# Patient Record
Sex: Male | Born: 1945
Health system: Southern US, Community
[De-identification: ages and names within clinical notes are randomized; demographics above are authoritative.]

## PROBLEM LIST (undated history)

## (undated) DIAGNOSIS — K819 Cholecystitis, unspecified: Secondary | ICD-10-CM

## (undated) DIAGNOSIS — K851 Biliary acute pancreatitis without necrosis or infection: Secondary | ICD-10-CM

## (undated) DIAGNOSIS — E785 Hyperlipidemia, unspecified: Secondary | ICD-10-CM

## (undated) DIAGNOSIS — I10 Essential (primary) hypertension: Secondary | ICD-10-CM

## (undated) DIAGNOSIS — R7303 Prediabetes: Secondary | ICD-10-CM

## (undated) DIAGNOSIS — I7143 Infrarenal abdominal aortic aneurysm, without rupture: Secondary | ICD-10-CM

## (undated) DIAGNOSIS — I714 Abdominal aortic aneurysm, without rupture: Secondary | ICD-10-CM

---

## 2016-08-17 DIAGNOSIS — R69 Illness, unspecified: Secondary | ICD-10-CM | POA: Diagnosis not present

## 2016-08-20 DIAGNOSIS — R69 Illness, unspecified: Secondary | ICD-10-CM | POA: Diagnosis not present

## 2016-11-27 DIAGNOSIS — R197 Diarrhea, unspecified: Secondary | ICD-10-CM | POA: Diagnosis not present

## 2016-11-27 DIAGNOSIS — R143 Flatulence: Secondary | ICD-10-CM | POA: Diagnosis not present

## 2016-11-27 DIAGNOSIS — R030 Elevated blood-pressure reading, without diagnosis of hypertension: Secondary | ICD-10-CM | POA: Diagnosis not present

## 2017-05-02 ENCOUNTER — Emergency Department (HOSPITAL_BASED_OUTPATIENT_CLINIC_OR_DEPARTMENT_OTHER): Payer: Medicare HMO

## 2017-05-02 ENCOUNTER — Other Ambulatory Visit: Payer: Self-pay

## 2017-05-02 ENCOUNTER — Encounter (HOSPITAL_BASED_OUTPATIENT_CLINIC_OR_DEPARTMENT_OTHER): Payer: Self-pay | Admitting: *Deleted

## 2017-05-02 ENCOUNTER — Inpatient Hospital Stay (HOSPITAL_BASED_OUTPATIENT_CLINIC_OR_DEPARTMENT_OTHER)
Admission: EM | Admit: 2017-05-02 | Discharge: 2017-05-08 | DRG: 418 | Disposition: A | Discharge status: Home / Self Care | Payer: Medicare HMO | Attending: Internal Medicine | Admitting: Internal Medicine

## 2017-05-02 DIAGNOSIS — K8500 Idiopathic acute pancreatitis without necrosis or infection: Secondary | ICD-10-CM

## 2017-05-02 DIAGNOSIS — R197 Diarrhea, unspecified: Secondary | ICD-10-CM | POA: Diagnosis not present

## 2017-05-02 DIAGNOSIS — R7303 Prediabetes: Secondary | ICD-10-CM | POA: Diagnosis not present

## 2017-05-02 DIAGNOSIS — D72828 Other elevated white blood cell count: Secondary | ICD-10-CM | POA: Diagnosis present

## 2017-05-02 DIAGNOSIS — K811 Chronic cholecystitis: Secondary | ICD-10-CM | POA: Diagnosis not present

## 2017-05-02 DIAGNOSIS — I739 Peripheral vascular disease, unspecified: Secondary | ICD-10-CM | POA: Diagnosis present

## 2017-05-02 DIAGNOSIS — R52 Pain, unspecified: Secondary | ICD-10-CM

## 2017-05-02 DIAGNOSIS — Z87891 Personal history of nicotine dependence: Secondary | ICD-10-CM | POA: Diagnosis not present

## 2017-05-02 DIAGNOSIS — K8020 Calculus of gallbladder without cholecystitis without obstruction: Secondary | ICD-10-CM | POA: Diagnosis not present

## 2017-05-02 DIAGNOSIS — D72829 Elevated white blood cell count, unspecified: Secondary | ICD-10-CM | POA: Diagnosis present

## 2017-05-02 DIAGNOSIS — I714 Abdominal aortic aneurysm, without rupture, unspecified: Secondary | ICD-10-CM | POA: Diagnosis present

## 2017-05-02 DIAGNOSIS — K810 Acute cholecystitis: Secondary | ICD-10-CM | POA: Diagnosis present

## 2017-05-02 DIAGNOSIS — E785 Hyperlipidemia, unspecified: Secondary | ICD-10-CM | POA: Diagnosis present

## 2017-05-02 DIAGNOSIS — K760 Fatty (change of) liver, not elsewhere classified: Secondary | ICD-10-CM | POA: Diagnosis not present

## 2017-05-02 DIAGNOSIS — K8012 Calculus of gallbladder with acute and chronic cholecystitis without obstruction: Secondary | ICD-10-CM | POA: Diagnosis present

## 2017-05-02 DIAGNOSIS — R935 Abnormal findings on diagnostic imaging of other abdominal regions, including retroperitoneum: Secondary | ICD-10-CM | POA: Diagnosis not present

## 2017-05-02 DIAGNOSIS — R945 Abnormal results of liver function studies: Secondary | ICD-10-CM | POA: Diagnosis not present

## 2017-05-02 DIAGNOSIS — I10 Essential (primary) hypertension: Secondary | ICD-10-CM | POA: Diagnosis present

## 2017-05-02 DIAGNOSIS — R739 Hyperglycemia, unspecified: Secondary | ICD-10-CM | POA: Diagnosis present

## 2017-05-02 DIAGNOSIS — Z803 Family history of malignant neoplasm of breast: Secondary | ICD-10-CM

## 2017-05-02 DIAGNOSIS — Z8249 Family history of ischemic heart disease and other diseases of the circulatory system: Secondary | ICD-10-CM | POA: Diagnosis not present

## 2017-05-02 DIAGNOSIS — Z882 Allergy status to sulfonamides status: Secondary | ICD-10-CM | POA: Diagnosis not present

## 2017-05-02 DIAGNOSIS — K851 Biliary acute pancreatitis without necrosis or infection: Principal | ICD-10-CM | POA: Diagnosis present

## 2017-05-02 DIAGNOSIS — K859 Acute pancreatitis without necrosis or infection, unspecified: Secondary | ICD-10-CM

## 2017-05-02 DIAGNOSIS — R109 Unspecified abdominal pain: Secondary | ICD-10-CM | POA: Diagnosis not present

## 2017-05-02 DIAGNOSIS — I160 Hypertensive urgency: Secondary | ICD-10-CM | POA: Diagnosis present

## 2017-05-02 DIAGNOSIS — Z7982 Long term (current) use of aspirin: Secondary | ICD-10-CM

## 2017-05-02 DIAGNOSIS — E162 Hypoglycemia, unspecified: Secondary | ICD-10-CM | POA: Diagnosis not present

## 2017-05-02 DIAGNOSIS — E876 Hypokalemia: Secondary | ICD-10-CM | POA: Diagnosis not present

## 2017-05-02 HISTORY — DX: Essential (primary) hypertension: I10

## 2017-05-02 HISTORY — DX: Prediabetes: R73.03

## 2017-05-02 HISTORY — DX: Hyperlipidemia, unspecified: E78.5

## 2017-05-02 LAB — LIPID PANEL
CHOL/HDL RATIO: 2.5 ratio
CHOLESTEROL: 176 mg/dL (ref 0–200)
HDL: 71 mg/dL (ref >40)
LDL Cholesterol: 91 mg/dL (ref 0–99)
Triglycerides: 69 mg/dL (ref <150)
VLDL: 14 mg/dL (ref 0–40)

## 2017-05-02 LAB — LIPASE, BLOOD: Lipase: 3890 U/L — ABNORMAL HIGH (ref 11–51)

## 2017-05-02 LAB — URINALYSIS, ROUTINE W REFLEX MICROSCOPIC
BILIRUBIN URINE: NEGATIVE
GLUCOSE, UA: 250 mg/dL — AB
HGB URINE DIPSTICK: NEGATIVE
Ketones, ur: 15 mg/dL — AB
Leukocytes, UA: NEGATIVE
Nitrite: NEGATIVE
PH: 6.5 (ref 5.0–8.0)
Protein, ur: NEGATIVE mg/dL
SPECIFIC GRAVITY, URINE: 1.01 (ref 1.005–1.030)

## 2017-05-02 LAB — COMPREHENSIVE METABOLIC PANEL
ALBUMIN: 4.3 g/dL (ref 3.5–5.0)
ALK PHOS: 72 U/L (ref 38–126)
ALT: 272 U/L — ABNORMAL HIGH (ref 17–63)
AST: 469 U/L — AB (ref 15–41)
Anion gap: 13 (ref 5–15)
BUN: 15 mg/dL (ref 6–20)
CO2: 23 mmol/L (ref 22–32)
Calcium: 9.2 mg/dL (ref 8.9–10.3)
Chloride: 100 mmol/L — ABNORMAL LOW (ref 101–111)
Creatinine, Ser: 0.92 mg/dL (ref 0.61–1.24)
GFR calc Af Amer: >60 mL/min (ref >60)
GFR calc non Af Amer: >60 mL/min (ref >60)
GLUCOSE: 252 mg/dL — AB (ref 65–99)
POTASSIUM: 4.2 mmol/L (ref 3.5–5.1)
SODIUM: 136 mmol/L (ref 135–145)
Total Bilirubin: 2 mg/dL — ABNORMAL HIGH (ref 0.3–1.2)
Total Protein: 7.8 g/dL (ref 6.5–8.1)

## 2017-05-02 LAB — CBC
HEMATOCRIT: 45.9 % (ref 39.0–52.0)
Hemoglobin: 15.7 g/dL (ref 13.0–17.0)
MCH: 29.1 pg (ref 26.0–34.0)
MCHC: 34.2 g/dL (ref 30.0–36.0)
MCV: 85 fL (ref 78.0–100.0)
Platelets: 284 10*3/uL (ref 150–400)
RBC: 5.4 MIL/uL (ref 4.22–5.81)
RDW: 13.3 % (ref 11.5–15.5)
WBC: 11.7 10*3/uL — ABNORMAL HIGH (ref 4.0–10.5)

## 2017-05-02 LAB — GLUCOSE, CAPILLARY: Glucose-Capillary: 176 mg/dL — ABNORMAL HIGH (ref 65–99)

## 2017-05-02 MED ORDER — MORPHINE SULFATE (PF) 4 MG/ML IV SOLN
2.0000 mg | INTRAVENOUS | Status: DC | PRN
  Administered 2017-05-02 – 2017-05-04 (×10): 2 mg via INTRAVENOUS
  Filled 2017-05-02 (×11): qty 1

## 2017-05-02 MED ORDER — FAMOTIDINE IN NACL 20-0.9 MG/50ML-% IV SOLN
20.0000 mg | Freq: Once | INTRAVENOUS | Status: AC
  Administered 2017-05-02: 20 mg via INTRAVENOUS
  Filled 2017-05-02: qty 50

## 2017-05-02 MED ORDER — IOPAMIDOL (ISOVUE-300) INJECTION 61%
100.0000 mL | Freq: Once | INTRAVENOUS | Status: AC | PRN
  Administered 2017-05-02: 100 mL via INTRAVENOUS

## 2017-05-02 MED ORDER — INSULIN ASPART 100 UNIT/ML ~~LOC~~ SOLN
0.0000 [IU] | Freq: Four times a day (QID) | SUBCUTANEOUS | Status: DC
  Administered 2017-05-02 – 2017-05-03 (×3): 2 [IU] via SUBCUTANEOUS
  Administered 2017-05-04: 3 [IU] via SUBCUTANEOUS

## 2017-05-02 MED ORDER — IPRATROPIUM-ALBUTEROL 0.5-2.5 (3) MG/3ML IN SOLN: 3.0000 mL | RESPIRATORY_TRACT | Status: DC | PRN

## 2017-05-02 MED ORDER — ONDANSETRON HCL 4 MG/2ML IJ SOLN
4.0000 mg | Freq: Four times a day (QID) | INTRAMUSCULAR | Status: DC | PRN
  Administered 2017-05-02 – 2017-05-04 (×4): 4 mg via INTRAVENOUS
  Filled 2017-05-02 (×4): qty 2

## 2017-05-02 MED ORDER — SODIUM CHLORIDE 0.9 % IV SOLN
INTRAVENOUS | Status: DC
  Administered 2017-05-02 – 2017-05-05 (×10): via INTRAVENOUS
  Administered 2017-05-06: 1000 mL via INTRAVENOUS
  Administered 2017-05-06: 04:00:00 via INTRAVENOUS

## 2017-05-02 MED ORDER — HYDRALAZINE HCL 20 MG/ML IJ SOLN
10.0000 mg | INTRAMUSCULAR | Status: DC | PRN
  Administered 2017-05-04 – 2017-05-06 (×3): 10 mg via INTRAVENOUS
  Filled 2017-05-02 (×3): qty 1

## 2017-05-02 MED ORDER — ENOXAPARIN SODIUM 40 MG/0.4ML ~~LOC~~ SOLN
40.0000 mg | SUBCUTANEOUS | Status: DC
  Administered 2017-05-02 – 2017-05-04 (×3): 40 mg via SUBCUTANEOUS
  Filled 2017-05-02 (×3): qty 0.4

## 2017-05-02 MED ORDER — HYDRALAZINE HCL 20 MG/ML IJ SOLN
10.0000 mg | Freq: Once | INTRAMUSCULAR | Status: AC
  Administered 2017-05-02: 10 mg via INTRAVENOUS
  Filled 2017-05-02: qty 1

## 2017-05-02 MED ORDER — HYDROMORPHONE HCL 1 MG/ML IJ SOLN
1.0000 mg | Freq: Once | INTRAMUSCULAR | Status: AC
  Administered 2017-05-02: 1 mg via INTRAVENOUS
  Filled 2017-05-02: qty 1

## 2017-05-02 NOTE — ED Notes (Signed)
ED Provider at bedside. 

## 2017-05-02 NOTE — H&P (Addendum)
History and Physical    Nathan Baxter ZOX:[id number] DOB: 10-07-1945 DOA: 05/02/2017  Referring MD/NP/PA: Dr. Thurman Coyer PCP: Patient, No Pcp Per  Patient coming from:  [facility] transfer  Chief Complaint:   Abdominal pain Patient with previous history of I have personally briefly reviewed patient's old medical records in [facility]   HPI: Nathan Baxter is a 72 y.o. male with medical history significant of HTN, hyperlipidemia, and prediabetes; who presents with complaints of abdominal pain.  Patient reports having acute onset of generalized crampy abdominal pain early this morning around 1 AM.  Rate pain as a 10 out of 10 on the pain scale.  Thereafter he reported having 4-5 episodes of loose diarrhea and 3-4 episodes of nonbloody vomiting.  Associated symptoms included feeling clammy, belching, insomnia, and short of breath due to pain.  Nothing helped relieve symptoms.  He has never had symptoms like this past.  Denies any chest pain, recent sick contacts, significant alcohol use, NSAID use, or dysuria.   Patient previously lived in [place] and reports having issues with hypertension for which he was on lisinopril and hydrochlorothiazide, and prediabetes treated with metformin.  He reports being off blood pressure medications for couple years now since moving to [place] as he does not have a PCP.  Reports that he self stopped metformin as it gave him significant diarrhea.  Currently he takes a aspirin and over-the-counter medications for cholesterol.  ED Course: Upon admission into the emergency department patient was noted to be afebrile, pulse in the 70s, blood pressure 138/77-, O2 saturations 200/110, and O2 saturations 89-100% on room air.  Labs reviewed significant for RBC 11.7, glucose 252,  lipase 3890, AST 469, ALT 272, and total bilirubin 2.  Urinalysis is negative for any acute abnormalities.  CT scan of the abdomen showed focal wall thickening and inflammatory changes along the  posterior wall of the gastric body reported to be concerning for peptic ulcer and 3.3 cm infrarenal abdominal aortic aneurysm.  Patient was started on normal saline at 125 mL/h, Pepcid IV, antiemetics, and pain medication.  Accepted to a MedSurg bed at [facility].  Review of Systems  Constitutional: Positive for diaphoresis and malaise/fatigue. Negative for fever.  HENT: Negative for congestion and ear discharge.   Eyes: Negative for pain and discharge.  Respiratory: Positive for cough and shortness of breath.   Cardiovascular: Negative for chest pain and leg swelling.  Gastrointestinal: Positive for abdominal pain, diarrhea, nausea and vomiting. Negative for blood in stool.  Genitourinary: Negative for dysuria and hematuria.  Musculoskeletal: Negative for falls and neck pain.  Skin: Negative for itching and rash.  Neurological: Negative for sensory change and focal weakness.  Psychiatric/Behavioral: Negative for substance abuse.    Past Medical History:  Diagnosis Date  . HLD (hyperlipidemia)   . HTN (hypertension)   . Prediabetes     History reviewed. No pertinent surgical history.   reports that he has quit smoking. he has never used smokeless tobacco. He reports that he drinks alcohol. He reports that he does not use drugs.  Allergies  Allergen Reactions  . Sulfa Antibiotics     Family History  Problem Relation Age of Onset  . Breast cancer Mother   . Hypertension Father   . Hypertension Sister     Prior to Admission medications   Medication Sig Start Date End Date Taking? Authorizing Provider  aspirin EC 81 MG tablet Take 81 mg by mouth daily.   Yes [provider]  cyanocobalamin 500 MCG tablet Take 500 mcg by mouth daily.   Yes [provider]  folic acid (FOLVITE) 1 MG tablet Take 1 mg by mouth daily.   Yes [provider]  Multiple Vitamin (MULTIVITAMIN) tablet Take 1 tablet by mouth daily.   Yes [provider]  Omega-3 Fatty  Acids (FISH OIL) 1000 MG CPDR Take by mouth.   Yes [provider]    Physical Exam:  Constitutional: NAD, calm, comfortable Vitals:   05/02/17 1630 05/02/17 1821 05/02/17 1900 05/02/17 1947  BP: (!) 178/105 (!) 200/110 (!) 150/90 138/77  Pulse: 66   75  Resp: 16   18  Temp:    98.9 F (37.2 C)  TempSrc:    Oral  SpO2: 95%   98%  Weight:      Height:       Eyes: PERRL, lids and conjunctivae normal ENMT: Mucous membranes are moist. Posterior pharynx clear of any exudate or lesions.Normal dentition.  Neck: normal, supple, no masses, no thyromegaly Respiratory: clear to auscultation bilaterally, no wheezing, no crackles. Normal respiratory effort. No accessory muscle use.  Cardiovascular: Regular rate and rhythm, no murmurs / rubs / gallops. No extremity edema. 2+ pedal pulses. No carotid bruits.  Abdomen: Epigastric tenderness present.  No masses palpated. No hepatosplenomegaly. Bowel sounds positive.  Musculoskeletal: no clubbing / cyanosis. No joint deformity upper and lower extremities. Good ROM, no contractures. Normal muscle tone.  Skin: no rashes, lesions, ulcers. No induration Neurologic: CN 2-12 grossly intact. Sensation intact, DTR normal. Strength 5/5 in all 4.  Psychiatric: Normal judgment and insight. Alert and oriented x 3. Normal mood.     Labs on Admission: I have personally reviewed following labs and imaging studies  CBC: Recent Labs  Lab 05/02/17 1114  WBC 11.7*  HGB 15.7  HCT 45.9  MCV 85.0  PLT 284   Basic Metabolic Panel: Recent Labs  Lab 05/02/17 1114  NA 136  K 4.2  CL 100*  CO2 23  GLUCOSE 252*  BUN 15  CREATININE 0.92  CALCIUM 9.2   GFR: Estimated Creatinine Clearance: 93.2 mL/min (by C-G formula based on SCr of 0.92 mg/dL). Liver Function Tests: Recent Labs  Lab 05/02/17 1114  AST 469*  ALT 272*  ALKPHOS 72  BILITOT 2.0*  PROT 7.8  ALBUMIN 4.3   Recent Labs  Lab 05/02/17 1114  LIPASE 3,890*   No results for  input(s): AMMONIA in the last 168 hours. Coagulation Profile: No results for input(s): INR, PROTIME in the last 168 hours. Cardiac Enzymes: No results for input(s): CKTOTAL, CKMB, CKMBINDEX, TROPONINI in the last 168 hours. BNP (last 3 results) No results for input(s): PROBNP in the last 8760 hours. HbA1C: No results for input(s): HGBA1C in the last 72 hours. CBG: No results for input(s): GLUCAP in the last 168 hours. Lipid Profile: No results for input(s): CHOL, HDL, LDLCALC, TRIG, CHOLHDL, LDLDIRECT in the last 72 hours. Thyroid Function Tests: No results for input(s): TSH, T4TOTAL, FREET4, T3FREE, THYROIDAB in the last 72 hours. Anemia Panel: No results for input(s): VITAMINB12, FOLATE, FERRITIN, TIBC, IRON, RETICCTPCT in the last 72 hours. Urine analysis:    Component Value Date/Time   COLORURINE YELLOW 05/02/2017 1206   APPEARANCEUR CLEAR 05/02/2017 1206   LABSPEC 1.010 05/02/2017 1206   PHURINE 6.5 05/02/2017 1206   GLUCOSEU 250 (A) 05/02/2017 1206   HGBUR NEGATIVE 05/02/2017 1206   BILIRUBINUR NEGATIVE 05/02/2017 1206   KETONESUR 15 (A) 05/02/2017 1206  PROTEINUR NEGATIVE 05/02/2017 1206   NITRITE NEGATIVE 05/02/2017 1206   LEUKOCYTESUR NEGATIVE 05/02/2017 1206   Sepsis Labs: No results found for this or any previous visit (from the past 240 hour(s)).   Radiological Exams on Admission: Ct Abdomen Pelvis W Contrast  Result Date: 05/02/2017 CLINICAL DATA:  Acute generalized abdominal pain. EXAM: CT ABDOMEN AND PELVIS WITH CONTRAST TECHNIQUE: Multidetector CT imaging of the abdomen and pelvis was performed using the standard protocol following bolus administration of intravenous contrast. CONTRAST:  ISOVUE-300 IOPAMIDOL (ISOVUE-300) INJECTION 61% COMPARISON:  None. FINDINGS: Lower chest: No acute abnormality. Hepatobiliary: No focal liver abnormality is seen. No gallstones, gallbladder wall thickening, or biliary dilatation. Pancreas: Unremarkable. No pancreatic  ductal dilatation or surrounding inflammatory changes. Spleen: Normal in size without focal abnormality. Adrenals/Urinary Tract: Adrenal glands are unremarkable. Kidneys are normal, without renal calculi, focal lesion, or hydronephrosis. Bladder is unremarkable. Stomach/Bowel: The appendix appears normal. There is no evidence of bowel obstruction. Focal wall thickening and inflammatory changes are seen involving the posterior wall of the gastric body, which may represent peptic ulcer disease. Small amount of fluid is also seen posterior to second portion of duodenum. Vascular/Lymphatic: 3.3 cm infrarenal abdominal aortic aneurysm is noted. Atherosclerosis of abdominal aorta is noted. No adenopathy is noted. Reproductive: Prostate is unremarkable. Other: Small fat containing periumbilical hernia is noted. Musculoskeletal: No acute or significant osseous findings. IMPRESSION: Focal wall thickening and other inflammatory changes are seen along the posterior wall of gastric body concerning for possible peptic ulcer disease. Small amount of fluid is seen posterior to the second portion of duodenum which may be related to peptic ulcer disease is well. 3.3 cm infrarenal abdominal aortic aneurysm. Recommend followup by ultrasound in 3 years. This recommendation follows ACR consensus guidelines: White Paper of the ACR Incidental Findings Committee II on Vascular Findings. J Am Coll Radiol 2013; 10:789-794. Aortic Atherosclerosis (ICD10-I70.0). Electronically Signed   By: Lupita Raider, M.D.   On: 05/02/2017 13:20    EKG: Independently reviewed.  Normal sinus rhythm with left axis deviation with no previous to compare.  Assessment/Plan Suspect Pancreatitis: Acute.  Patient presents with acute onset of abdominal pain.  Lipase 3890 with elevated LFTs.  CT showing inflammation around the posterior aspect of the stomach without significant abnormality noted around the pancreas.  Suspect pancreatiti s versus PUD. - Admit  to MedSurg bed - NPO - Check lipid panel - Check abdominal ultrasound - Continue normal saline at 125 mL/h - Morphine IV prn pain - Antiemetics as needed - Incentive Spirometry - May  warrant further evaluation of over-the-counter medications - Consider need of consult GI in a.m. - Restart all home medications when medically appropriate  Leukocytosis: Acute.  Likely reactive to above. - Recheck CBC in a.m.  Hypertensive urgency: Patient noted to have elevated blood pressures 200/110 on admission.  Given IV hydralazine with improvement blood pressures.  Suspect related to pain, but patient previously on blood pressure medications but not been taking them - Hydralazine IV as needed elevated blood pressures - Patient may likely need to be placed back on blood pressure medication  Hyperglycemia: Acute.  Glucose 252 on admission.  Patient has a previous history of being prediabetic, but reports being unable to tolerate metformin was previously placed on in the past.  May need to be restarted on blood pressure medication - Hypoglycemic protocol - Check hemoglobin A1c in a.m. - CBGs every 6 hours with custom SSI  Elevated liver enzymes - recheck CMP in  a.m.  Infrarenal abdominal aortic aneurysm:  Incidental finding on CT scan noted to be 3.3 cm.   -  Recommend outpatient follow-up and ultrasound per guidelines in 3  *Patient in need of primary care provider  DVT prophylaxis: lovenox   Code Status: full  Family Communication: No family present at bedside Disposition Plan: Likely discharge home in 1-2 days Consults called: none  Admission status: inpatient  Clydie Braunondell A Kennis Wissmann MD [facility] Pager [phone]   If 7PM-7AM, please contact night-coverage [email or web address] Password [email or web address]  05/02/2017, 8:39 PM

## 2017-05-02 NOTE — Progress Notes (Signed)
[facility]. He is hemodynamically stable. Will need GI consult.   Kathlen ModyVijaya Virlee Stroschein, MD [phone]

## 2017-05-02 NOTE — ED Notes (Signed)
ED TO INPATIENT HANDOFF REPORT  Name/Age/Gender Nathan Baxter 72 y.o. male  Code Status Code Status History    This patient does not have a recorded code status. Please follow your organizational policy for patients in this situation.      Home/SNF/Other home  Chief Complaint Abdominal Pain  Level of Care/Admitting Diagnosis ED Disposition    ED Disposition Condition [facility] Area: [facility] [[id number]]  Level of Care: Med-Surg [16]  Diagnosis: Pancreatitis [[id number]]  Admitting Physician: Hosie Poisson [[id number]]  Attending Physician: Hosie Poisson [[id number]]  Estimated length of stay: 3 - 4 days  Certification:: I certify this patient will need inpatient services for at least 2 midnights  PT Class (Do Not Modify): Inpatient [101]  PT Acc Code (Do Not Modify): Private [1]       Medical History History reviewed. No pertinent past medical history.  Allergies Allergies  Allergen Reactions  . Sulfa Antibiotics     IV Location/Drains/Wounds Patient Lines/Drains/Airways Status   Active Line/Drains/Airways    Name:   Placement date:   Placement time:   Site:   Days:   Peripheral IV 05/02/17 Right Antecubital   05/02/17    1120    Antecubital   less than 1          Labs/Imaging Results for orders placed or performed during the hospital encounter of 05/02/17 (from the past 48 hour(s))  Lipase, blood     Status: Abnormal   Collection Time: 05/02/17 11:14 AM  Result Value Ref Range   Lipase 3,890 (H) 11 - 51 U/L    Comment: REPEATED TO VERIFY RESULTS CONFIRMED BY MANUAL DILUTION Performed at [facility], [address] [place], [place] [address]   Comprehensive metabolic panel     Status: Abnormal   Collection Time: 05/02/17 11:14 AM  Result Value Ref Range   Sodium 136 135 - 145 mmol/L   Potassium 4.2 3.5 - 5.1 mmol/L   Chloride 100 (L) 101 - 111 mmol/L   CO2 23 22 - 32 mmol/L   Glucose, Bld 252 (H) 65 - 99 mg/dL   BUN 15 6 - 20 mg/dL   Creatinine, Ser 0.92 0.61 - 1.24 mg/dL   Calcium 9.2 8.9 - 10.3 mg/dL   Total Protein 7.8 6.5 - 8.1 g/dL   Albumin 4.3 3.5 - 5.0 g/dL   AST 469 (H) 15 - 41 U/L   ALT 272 (H) 17 - 63 U/L   Alkaline Phosphatase 72 38 - 126 U/L   Total Bilirubin 2.0 (H) 0.3 - 1.2 mg/dL   GFR calc non Af Amer >60 >60 mL/min   GFR calc Af Amer >60 >60 mL/min    Comment: (NOTE) The eGFR has been calculated using the CKD EPI equation. This calculation has not been validated in all clinical situations. eGFR's persistently <60 mL/min signify possible Chronic Kidney Disease.    Anion gap 13 5 - 15    Comment: Performed at [facility], [address] [place], [place] [address]  CBC     Status: Abnormal   Collection Time: 05/02/17 11:14 AM  Result Value Ref Range   WBC 11.7 (H) 4.0 - 10.5 K/uL   RBC 5.40 4.22 - 5.81 MIL/uL   Hemoglobin 15.7 13.0 - 17.0 g/dL   HCT 45.9 39.0 - 52.0 %   MCV 85.0 78.0 - 100.0 fL   MCH 29.1 26.0 - 34.0 pg   MCHC 34.2  30.0 - 36.0 g/dL   RDW 13.3 11.5 - 15.5 %   Platelets 284 150 - 400 K/uL    Comment: Performed at [facility], [address] [place], [place] [address]  Urinalysis, Routine w reflex microscopic     Status: Abnormal   Collection Time: 05/02/17 12:06 PM  Result Value Ref Range   Color, Urine YELLOW YELLOW   APPearance CLEAR CLEAR   Specific Gravity, Urine 1.010 1.005 - 1.030   pH 6.5 5.0 - 8.0   Glucose, UA 250 (A) NEGATIVE mg/dL   Hgb urine dipstick NEGATIVE NEGATIVE   Bilirubin Urine NEGATIVE NEGATIVE   Ketones, ur 15 (A) NEGATIVE mg/dL   Protein, ur NEGATIVE NEGATIVE mg/dL   Nitrite NEGATIVE NEGATIVE   Leukocytes, UA NEGATIVE NEGATIVE    Comment: Microscopic not done on urines with negative protein, blood, leukocytes, nitrite, or glucose < 500 mg/dL. Performed at [facility], [address] [place], [place] [address]    Ct Abdomen Pelvis W Contrast  Result Date: 05/02/2017 CLINICAL DATA:   Acute generalized abdominal pain. EXAM: CT ABDOMEN AND PELVIS WITH CONTRAST TECHNIQUE: Multidetector CT imaging of the abdomen and pelvis was performed using the standard protocol following bolus administration of intravenous contrast. CONTRAST:  133m ISOVUE-300 IOPAMIDOL (ISOVUE-300) INJECTION 61% COMPARISON:  None. FINDINGS: Lower chest: No acute abnormality. Hepatobiliary: No focal liver abnormality is seen. No gallstones, gallbladder wall thickening, or biliary dilatation. Pancreas: Unremarkable. No pancreatic ductal dilatation or surrounding inflammatory changes. Spleen: Normal in size without focal abnormality. Adrenals/Urinary Tract: Adrenal glands are unremarkable. Kidneys are normal, without renal calculi, focal lesion, or hydronephrosis. Bladder is unremarkable. Stomach/Bowel: The appendix appears normal. There is no evidence of bowel obstruction. Focal wall thickening and inflammatory changes are seen involving the posterior wall of the gastric body, which may represent peptic ulcer disease. Small amount of fluid is also seen posterior to second portion of duodenum. Vascular/Lymphatic: 3.3 cm infrarenal abdominal aortic aneurysm is noted. Atherosclerosis of abdominal aorta is noted. No adenopathy is noted. Reproductive: Prostate is unremarkable. Other: Small fat containing periumbilical hernia is noted. Musculoskeletal: No acute or significant osseous findings. IMPRESSION: Focal wall thickening and other inflammatory changes are seen along the posterior wall of gastric body concerning for possible peptic ulcer disease. Small amount of fluid is seen posterior to the second portion of duodenum which may be related to peptic ulcer disease is well. 3.3 cm infrarenal abdominal aortic aneurysm. Recommend followup by ultrasound in 3 years. This recommendation follows ACR consensus guidelines: White Paper of the ACR Incidental Findings Committee II on Vascular Findings. J Am Coll Radiol 2013; 10:789-794. Aortic  Atherosclerosis (ICD10-I70.0). Electronically Signed   By: JMarijo Conception M.D.   On: 05/02/2017 13:20    Pending Labs Unresulted Labs (From admission, onward)   None      Vitals/Pain Today's Vitals   05/02/17 1500 05/02/17 1530 05/02/17 1608 05/02/17 1630  BP: (!) 185/91 (!) 176/86 (!) 183/102 (!) 178/105  Pulse: 67 72 75 66  Resp: 17 (!) _0 Temp:      TempSrc:      SpO2: 98% 99% 98% 95%  Weight:      Height:      PainSc:   4      Isolation Precautions No active isolations  Medications Medications  0.9 %  sodium chloride infusion (not administered)  iopamidol (ISOVUE-300) 61 % injection 100 mL (100 mLs Intravenous Contrast Given 05/02/17 1254)  famotidine (PEPCID) IVPB 20 mg premix (0 mg Intravenous Stopped 05/02/17 1532)  HYDROmorphone (DILAUDID) injection 1 mg (1 mg Intravenous Given 05/02/17 1609)    Mobility ambulatory}

## 2017-05-02 NOTE — Progress Notes (Signed)
[facility]. Alert and oriented x 4. BP is elevated; notified MD at 1820; waiting for orders.

## 2017-05-02 NOTE — ED Triage Notes (Signed)
Pt reports pressure and pain to his abd, generalized, "like gas pains" x last night, was 10/10, pt states he took some gas x and was able to belch and the pain got somewhat better. Pt states he did get sweaty with the pain in the night, denies feeling dizzy or lightheaded, pt states he does feel sob, pt states he also pain to his left shoulder. ekg performed while pt being triaged.

## 2017-05-02 NOTE — ED Provider Notes (Signed)
[facility] EMERGENCY DEPARTMENT Provider Note   CSN: [id number] Arrival date & time: 05/02/17  1033     History   Chief Complaint Chief Complaint  Patient presents with  . Abdominal Pain    HPI Nathan Baxter is a 72 y.o. male.  Patient with diffuse gassy abdominal pain started 1 AM today accompanied by 4 or 5 episodes of vomiting and 5 or 6 episodes of diarrhea.  Symptoms are nonexertional, improved with belching.  He feels somewhat improved presently over earlier today.  He states he had some pain in both shoulders.  He denies any shortness of breath.  Denies nausea presently.  Symptoms improved by sitting upright and made worse with lying supine and are nonexertional no treatment prior to coming here  HPI  History reviewed. No pertinent past medical history.  There are no active problems to display for this patient.   History reviewed. No pertinent surgical history.  Past medical history diabetes hypertension has not been on medication for several years.  Formally on lisinopril HCTZ and metformin.  Stop metformin because it gave him profuse diarrhea.  Stopped lisinopril/HCTZ as he currently does not have a PCP   Home Medications    Prior to Admission medications   Medication Sig Start Date End Date Taking? Authorizing Provider  aspirin EC 81 MG tablet Take 81 mg by mouth daily.   Yes [provider]    Family History History reviewed. No pertinent family history.  Social History Social History   Tobacco Use  . Smoking status: Former Games developer  . Smokeless tobacco: Never Used  Substance Use Topics  . Alcohol use: Not on file  . Drug use: Not on file     Allergies   Sulfa antibiotics   Review of Systems Review of Systems  Constitutional: Negative.   HENT: Negative.   Respiratory: Negative.   Cardiovascular: Negative.   Gastrointestinal: Positive for abdominal pain, diarrhea, nausea and vomiting.  Musculoskeletal: Negative.   Skin:  Negative.   Allergic/Immunologic: Positive for immunocompromised state.       Diabetic  Neurological: Negative.   Psychiatric/Behavioral: Negative.   All other systems reviewed and are negative.    Physical Exam Updated Vital Signs BP (!) 192/88 (BP Location: Left Arm)   Pulse 63   Temp 98.2 F (36.8 C) (Oral)   Resp 20   Ht 6' (1.829 m)   Wt 110.7 kg (244 lb)   SpO2 100%   BMI 33.09 kg/m   Physical Exam  Constitutional: He appears well-developed and well-nourished.  HENT:  Head: Normocephalic and atraumatic.  Eyes: Conjunctivae are normal. Pupils are equal, round, and reactive to light.  Neck: Neck supple. No tracheal deviation present. No thyromegaly present.  Cardiovascular: Normal rate and regular rhythm.  No murmur heard. Pulmonary/Chest: Effort normal and breath sounds normal.  Abdominal: Soft. Bowel sounds are normal. He exhibits no distension. There is no tenderness.  Obese, mild diffuse tenderness  Genitourinary: Penis normal.  Genitourinary Comments: Normal male genitalia  Musculoskeletal: Normal range of motion. He exhibits no edema or tenderness.  Neurological: He is alert. Coordination normal.  Skin: Skin is warm and dry. No rash noted.  Psychiatric: He has a normal mood and affect.  Nursing note and vitals reviewed.    ED Treatments / Results  Labs (all labs ordered are listed, but only abnormal results are displayed) Labs Reviewed  LIPASE, BLOOD - Abnormal; Notable for the following components:      Result Value  Lipase 3,890 (*)    All other components within normal limits  COMPREHENSIVE METABOLIC PANEL - Abnormal; Notable for the following components:   Chloride 100 (*)    Glucose, Bld 252 (*)    AST 469 (*)    ALT 272 (*)    Total Bilirubin 2.0 (*)    All other components within normal limits  CBC - Abnormal; Notable for the following components:   WBC 11.7 (*)    All other components within normal limits  URINALYSIS, ROUTINE W REFLEX  MICROSCOPIC - Abnormal; Notable for the following components:   Glucose, UA 250 (*)    Ketones, ur 15 (*)    All other components within normal limits    EKG  EKG Interpretation  Date/Time:  Thursday May 02 2017 11:01:48 EST Ventricular Rate:  60 PR Interval:  192 QRS Duration: 126 QT Interval:  388 QTC Calculation: 388 R Axis:   -74 Text Interpretation:  Normal sinus rhythm Left axis deviation Non-specific intra-ventricular conduction block Cannot rule out Anteroseptal infarct , age undetermined Nonspecific T wave abnormality Abnormal ECG not Confirmed by Doug SouJacubowitz, Jeray Shugart [id number]) on 05/02/2017 12:30:13 PM       Radiology No results found.  Procedures Procedures (including critical care time)  Medications Ordered in ED Medications  iopamidol (ISOVUE-300) 61 % injection 100 mL (not administered)     Initial Impression / Assessment and Plan / ED Course  I have reviewed the triage vital signs and the nursing notes.  Pertinent labs & imaging results that were available during my care of the patient were reviewed by me and considered in my medical decision making (see chart for details).    240 p.m. requesting pain medicine.  IV Pepcid ordered.  355 PM requesting additional pain medicine.  Intravenous hydromorphone ordered. 4:20 PM pain is improved and patient resting comfortably after treatment with intravenous morphine Dr.Akula consulted by telephone.  [facility]  To be admitted to medical surgical bed will likely require gastroenterology consult. Final Clinical Impressions(s) / ED Diagnoses  Diagnoses #1 acute pancreatitis #2 nausea vomiting diarrhea #3 hyperbilirubinemia #4 hypertension #5 hyperglycemia #6 medication noncompliance Final diagnoses:  None    ED Discharge Orders    None       Doug SouJacubowitz, Allyn Bartelson, MD 05/02/17 1620

## 2017-05-03 ENCOUNTER — Inpatient Hospital Stay (HOSPITAL_COMMUNITY): Payer: Medicare HMO

## 2017-05-03 DIAGNOSIS — K8500 Idiopathic acute pancreatitis without necrosis or infection: Secondary | ICD-10-CM

## 2017-05-03 DIAGNOSIS — I714 Abdominal aortic aneurysm, without rupture, unspecified: Secondary | ICD-10-CM | POA: Diagnosis present

## 2017-05-03 DIAGNOSIS — R945 Abnormal results of liver function studies: Secondary | ICD-10-CM

## 2017-05-03 DIAGNOSIS — R935 Abnormal findings on diagnostic imaging of other abdominal regions, including retroperitoneum: Secondary | ICD-10-CM

## 2017-05-03 DIAGNOSIS — R739 Hyperglycemia, unspecified: Secondary | ICD-10-CM | POA: Diagnosis present

## 2017-05-03 DIAGNOSIS — D72829 Elevated white blood cell count, unspecified: Secondary | ICD-10-CM | POA: Diagnosis present

## 2017-05-03 DIAGNOSIS — I160 Hypertensive urgency: Secondary | ICD-10-CM | POA: Diagnosis present

## 2017-05-03 LAB — HEMOGLOBIN A1C
Hgb A1c MFr Bld: 6.3 % — ABNORMAL HIGH (ref 4.8–5.6)
Mean Plasma Glucose: 134.11 mg/dL

## 2017-05-03 LAB — COMPREHENSIVE METABOLIC PANEL
ALK PHOS: 68 U/L (ref 38–126)
ALT: 172 U/L — AB (ref 17–63)
AST: 137 U/L — AB (ref 15–41)
Albumin: 3.7 g/dL (ref 3.5–5.0)
Anion gap: 9 (ref 5–15)
BUN: 14 mg/dL (ref 6–20)
CALCIUM: 8.6 mg/dL — AB (ref 8.9–10.3)
CHLORIDE: 106 mmol/L (ref 101–111)
CO2: 26 mmol/L (ref 22–32)
CREATININE: 0.77 mg/dL (ref 0.61–1.24)
GFR calc non Af Amer: >60 mL/min (ref >60)
GLUCOSE: 144 mg/dL — AB (ref 65–99)
Potassium: 3.7 mmol/L (ref 3.5–5.1)
SODIUM: 141 mmol/L (ref 135–145)
Total Bilirubin: 1.9 mg/dL — ABNORMAL HIGH (ref 0.3–1.2)
Total Protein: 6.4 g/dL — ABNORMAL LOW (ref 6.5–8.1)

## 2017-05-03 LAB — GLUCOSE, CAPILLARY
GLUCOSE-CAPILLARY: 154 mg/dL — AB (ref 65–99)
GLUCOSE-CAPILLARY: 141 mg/dL — AB (ref 65–99)
Glucose-Capillary: 151 mg/dL — ABNORMAL HIGH (ref 65–99)

## 2017-05-03 LAB — CBC
HCT: 42.3 % (ref 39.0–52.0)
HEMOGLOBIN: 14.2 g/dL (ref 13.0–17.0)
MCH: 29.4 pg (ref 26.0–34.0)
MCHC: 33.6 g/dL (ref 30.0–36.0)
MCV: 87.6 fL (ref 78.0–100.0)
PLATELETS: 269 10*3/uL (ref 150–400)
RBC: 4.83 MIL/uL (ref 4.22–5.81)
RDW: 13.6 % (ref 11.5–15.5)
WBC: 14.9 10*3/uL — AB (ref 4.0–10.5)

## 2017-05-03 LAB — LIPASE, BLOOD: Lipase: 305 U/L — ABNORMAL HIGH (ref 11–51)

## 2017-05-03 MED ORDER — PANTOPRAZOLE SODIUM 40 MG IV SOLR
40.0000 mg | INTRAVENOUS | Status: DC
  Administered 2017-05-03 – 2017-05-06 (×4): 40 mg via INTRAVENOUS
  Filled 2017-05-03 (×4): qty 40

## 2017-05-03 NOTE — Progress Notes (Signed)
PROGRESS NOTE    Nathan Baxter  ZOX:[id number] DOB: February 20, 1946 DOA: 05/02/2017 PCP: Patient, No Pcp Per    Brief Narrative:  Nathan Baxter is a 72 y.o. male with medical history significant of HTN, hyperlipidemia, and prediabetes; who presents with complaints of abdominal pain.  Patient reports having acute onset of generalized crampy abdominal pain. Upon admission into the emergency department patient was noted to be afebrile, pulse in the 70s, blood pressure 138/77-, O2 saturations 200/110, and O2 saturations 89-100% on room air.  Labs reviewed significant for RBC 11.7, glucose 252,  lipase 3890, AST 469, ALT 272, and total bilirubin 2.  Urinalysis is negative for any acute abnormalities.  CT scan of the abdomen showed focal wall thickening and inflammatory changes along the posterior wall of the gastric body reported to be concerning for peptic ulcer and 3.3 cm infrarenal abdominal aortic aneurysm. He was admitted for acute pancreatitis and evaluation of PUD.       Assessment & Plan:   Principal Problem:   Pancreatitis Active Problems:   Leukocytosis   Hypertensive urgency   Abdominal aortic aneurysm (AAA) (HCC)   Hyperglycemia   Acute Pancreatitis of unclear etiology.  Improving abdominal pain and nausea, remain NPO, get MRCP AS per GI recommendations.  Improving lipase and liver enzymes. Resume IV fluids, IV morphine and anti emetics.  Appreciate GI recommendations.     Hyperglycemia:  Pt is prediabetic.  hgba1c is 6.3.  Resume SSI.    Leukocytosis;  Probably reactive. UA IS NEGATIVE.    Hypertensive urgency:  Resolved. Prn hydralazine    AAA:   Incidental finding on CT scan noted to be 3.3 cm.   -  Recommend outpatient follow-up and ultrasound per guidelines       DVT prophylaxis: lovenox.  Code Status: FULL CODE.  Family Communication: NONE AT BEDSIDE.  Disposition Plan: pending MRCP.    Consultants:   Gastroenterology.    Procedures: MRCP  scheduled.    Antimicrobials:none.    Subjective: Pain is better. Nausea improved.   Objective: Vitals:   05/02/17 1900 05/02/17 1947 05/03/17 0534 05/03/17 1449  BP: (!) 150/90 138/77 (!) 155/85 (!) 143/81  Pulse:  75 74 79  Resp:  18 19 18   Temp:  98.9 F (37.2 C) 99.6 F (37.6 C) 98.7 F (37.1 C)  TempSrc:  Oral Oral Oral  SpO2:  98% 96% 94%  Weight:      Height:        Intake/Output Summary (Last 24 hours) at 05/03/2017 1551 Last data filed at 05/02/2017 1800 Gross per 24 hour  Intake 145.83 ml  Output -  Net 145.83 ml   Filed Weights   05/02/17 1058  Weight: 110.7 kg (244 lb)    Examination:  General exam: Appears calm and comfortable  Respiratory system: Clear to auscultation. Respiratory effort normal. Cardiovascular system: S1 & S2 heard, RRR. No JVD, murmurs, rubs, gallops or clicks. No pedal edema. Gastrointestinal system: Abdomen is soft mildly tender in the upper quadrant. Non distended. Bowel sounds heard.  Central nervous system: Alert and oriented. No focal neurological deficits. Extremities: Symmetric 5 x 5 power. Skin: No rashes, lesions or ulcers Psychiatry: Judgement and insight appear normal. Mood & affect appropriate.     Data Reviewed: I have personally reviewed following labs and imaging studies  CBC: Recent Labs  Lab 05/02/17 1114 05/03/17 0539  WBC 11.7* 14.9*  HGB 15.7 14.2  HCT 45.9 42.3  MCV 85.0 87.6  PLT 284 269  Basic Metabolic Panel: Recent Labs  Lab 05/02/17 1114 05/03/17 0539  NA 136 141  K 4.2 3.7  CL 100* 106  CO2 23 26  GLUCOSE 252* 144*  BUN 15 14  CREATININE 0.92 0.77  CALCIUM 9.2 8.6*   GFR: Estimated Creatinine Clearance: 107.2 mL/min (by C-G formula based on SCr of 0.77 mg/dL). Liver Function Tests: Recent Labs  Lab 05/02/17 1114 05/03/17 0539  AST 469* 137*  ALT 272* 172*  ALKPHOS 72 68  BILITOT 2.0* 1.9*  PROT 7.8 6.4*  ALBUMIN 4.3 3.7   Recent Labs  Lab 05/02/17 1114 05/03/17 0539    LIPASE 3,890* 305*   No results for input(s): AMMONIA in the last 168 hours. Coagulation Profile: No results for input(s): INR, PROTIME in the last 168 hours. Cardiac Enzymes: No results for input(s): CKTOTAL, CKMB, CKMBINDEX, TROPONINI in the last 168 hours. BNP (last 3 results) No results for input(s): PROBNP in the last 8760 hours. HbA1C: Recent Labs    05/03/17 0539  HGBA1C 6.3*   CBG: Recent Labs  Lab 05/02/17 2346 05/03/17 0545 05/03/17 1210  GLUCAP 176* 151* 154*   Lipid Profile: Recent Labs    05/02/17 2145  CHOL 176  HDL 71  LDLCALC 91  TRIG 69  CHOLHDL 2.5   Thyroid Function Tests: No results for input(s): TSH, T4TOTAL, FREET4, T3FREE, THYROIDAB in the last 72 hours. Anemia Panel: No results for input(s): VITAMINB12, FOLATE, FERRITIN, TIBC, IRON, RETICCTPCT in the last 72 hours. Sepsis Labs: No results for input(s): PROCALCITON, LATICACIDVEN in the last 168 hours.  No results found for this or any previous visit (from the past 240 hour(s)).       Radiology Studies: [place] Abdomen Complete  Result Date: 05/03/2017 CLINICAL DATA:  72 year old male with history of pancreatitis. EXAM: ABDOMEN ULTRASOUND COMPLETE COMPARISON:  No prior abdominal ultrasound. CT the abdomen and pelvis 05/02/2017. FINDINGS: Gallbladder: No gallstones or wall thickening visualized. No sonographic Murphy sign noted by sonographer. Common bile duct: Diameter: 4.8 mm Liver: No focal lesion identified. Within normal limits in parenchymal echogenicity. Portal vein is patent on color Doppler imaging with normal direction of blood flow towards the liver. IVC: No abnormality visualized. Pancreas: Poorly visualized. Spleen: Size and appearance within normal limits. Right Kidney: Length: 12.1 cm. Echogenicity within normal limits. No mass or hydronephrosis visualized. Left Kidney: Length: 12.2 cm. Echogenicity within normal limits. No mass or hydronephrosis visualized. Abdominal aorta: Fusiform  dilatation of the infrarenal abdominal aorta measuring up to 3.1 cm in diameter. Other findings: None. IMPRESSION: 1. Pancreas is poorly visualized on today's examination secondary to overlying bowel gas. 2. No acute findings. 3. Small infrarenal abdominal aortic aneurysm measuring 3.1 cm in diameter. Recommend followup by ultrasound in 3 years. This recommendation follows ACR consensus guidelines: White Paper of the ACR Incidental Findings Committee II on Vascular Findings. J Am Coll Radiol 2013; 10:789-794. Electronically Signed   By: Trudie Reedaniel  Entrikin M.D.   On: 05/03/2017 15:38   Ct Abdomen Pelvis W Contrast  Result Date: 05/02/2017 CLINICAL DATA:  Acute generalized abdominal pain. EXAM: CT ABDOMEN AND PELVIS WITH CONTRAST TECHNIQUE: Multidetector CT imaging of the abdomen and pelvis was performed using the standard protocol following bolus administration of intravenous contrast. CONTRAST:  100mL ISOVUE-300 IOPAMIDOL (ISOVUE-300) INJECTION 61% COMPARISON:  None. FINDINGS: Lower chest: No acute abnormality. Hepatobiliary: No focal liver abnormality is seen. No gallstones, gallbladder wall thickening, or biliary dilatation. Pancreas: Unremarkable. No pancreatic ductal dilatation or surrounding inflammatory changes. Spleen: Normal in  size without focal abnormality. Adrenals/Urinary Tract: Adrenal glands are unremarkable. Kidneys are normal, without renal calculi, focal lesion, or hydronephrosis. Bladder is unremarkable. Stomach/Bowel: The appendix appears normal. There is no evidence of bowel obstruction. Focal wall thickening and inflammatory changes are seen involving the posterior wall of the gastric body, which may represent peptic ulcer disease. Small amount of fluid is also seen posterior to second portion of duodenum. Vascular/Lymphatic: 3.3 cm infrarenal abdominal aortic aneurysm is noted. Atherosclerosis of abdominal aorta is noted. No adenopathy is noted. Reproductive: Prostate is unremarkable. Other:  Small fat containing periumbilical hernia is noted. Musculoskeletal: No acute or significant osseous findings. IMPRESSION: Focal wall thickening and other inflammatory changes are seen along the posterior wall of gastric body concerning for possible peptic ulcer disease. Small amount of fluid is seen posterior to the second portion of duodenum which may be related to peptic ulcer disease is well. 3.3 cm infrarenal abdominal aortic aneurysm. Recommend followup by ultrasound in 3 years. This recommendation follows ACR consensus guidelines: White Paper of the ACR Incidental Findings Committee II on Vascular Findings. J Am Coll Radiol 2013; 10:789-794. Aortic Atherosclerosis (ICD10-I70.0). Electronically Signed   By: Lupita Raider, M.D.   On: 05/02/2017 13:20        Scheduled Meds: . enoxaparin (LOVENOX) injection  40 mg Subcutaneous Q24H  . insulin aspart  0-7 Units Subcutaneous Q6H  . pantoprazole (PROTONIX) IV  40 mg Intravenous Q24H   Continuous Infusions: . sodium chloride 125 mL/hr at 05/03/17 0120     LOS: 1 day    Time spent: 35 min    Kathlen Mody, MD [facility] Pager [phone] If 7PM-7AM, please contact night-coverage [email or web address] Password [facility] 05/03/2017, 3:51 PM

## 2017-05-03 NOTE — Consult Note (Addendum)
Referring Provider: [facility]  Primary Care Physician:  Patient, No Pcp Per Primary Gastroenterologist:  unassigned  Reason for Consultation:    Pancreatitis, ? PUD   ASSESSMENT AND PLAN:    8. 72 yo male with acute pancreatitis, possibly biliary given concurrent liver lab abnormalities. No cholelithiasis or CBD dilation on CT scan however. Interestingly no findings of pancreatitis on imaging.  -He doesn't have usual risk factors for pancreatitis (meds, eTOH, gallstones). Will order MRCP to evaluate for small stones / sludge. Liver labs have improved overnight. If MRCP negative will need triglycerides checked but again with the liver chemistries being elevated this speaks more to biliary source.  -No overly tender on exam. Continue IVF, NPO. HCT 43. Renal function normal.   2. Hypertensive urgency, treated with hydralazine. BP down to 155/85  3. Infrarenal AAA, 3.3 cm  4. ? PUD on CT scan. Inflammation involving posterior gastric wall / fluid around  second portion of D2. Findings possible related to pancreatitis. With elevated lipase perforated ulcer possible but seems unlikely.  -eventual EGD to evaluate findings.   5. ? DM2. Elevated glucose. A1c 6.3  HPI: Nuri Larmer is a 72 y.o. male admitted with pancreatitis. Abdominal pain started yesterday while brushing teeth. It did not radiate through to the back. It was diffuse and associated with bloating followed by vomiting and diarrhea. He could not get comfortable. Pain worse in lying position, better when sitting upright.  In the ED, white count 11.7, normal hemoglobin at 15.7.  Lipase was nearly 4000, AST 469, ALT 272, total bilirubin 2.  CT scan with contrast Negative for liver abnormalities or ductal dilation. No gallstones or gallbladder wall thickening.  Pancreas unremarkable.  There is focal wall thickening and other inflammatory changes along the posterior wall of the gastric body concerning for PUD.  Small amount of  fluid posterior to the second portion of the duodenum seen, possibly related to PUD.  No other GI complaints. Last colonoscopy ~ 15 years ago. No blood in stool. No unusual weight loss. No hx of PUD. Takes a baby asa daily, no other NSAIDS  Past Medical History:  Diagnosis Date  . HLD (hyperlipidemia)   . HTN (hypertension)   . Prediabetes     History reviewed. No pertinent surgical history.  Prior to Admission medications   Medication Sig Start Date End Date Taking? Authorizing Provider  aspirin EC 81 MG tablet Take 81 mg by mouth daily.   Yes [provider]  cyanocobalamin 500 MCG tablet Take 500 mcg by mouth daily.   Yes [provider]  folic acid (FOLVITE) 1 MG tablet Take 1 mg by mouth daily.   Yes [provider]  Multiple Vitamin (MULTIVITAMIN) tablet Take 1 tablet by mouth daily.   Yes [provider]  Omega-3 Fatty Acids (FISH OIL) 1000 MG CPDR Take by mouth.   Yes [provider]    Current Facility-Administered Medications  Medication Dose Route Frequency Provider Last Rate Last Dose  . 0.9 %  sodium chloride infusion   Intravenous Continuous Orlie Dakin, MD 125 mL/hr at 05/03/17 0120    . enoxaparin (LOVENOX) injection 40 mg  40 mg Subcutaneous Q24H Fuller Plan A, MD   40 mg at 05/02/17 2201  . hydrALAZINE (APRESOLINE) injection 10 mg  10 mg Intravenous Q4H PRN Smith, Rondell A, MD      . insulin aspart (novoLOG) injection 0-7 Units  0-7 Units Subcutaneous Q6H Norval Morton, MD   2  Units at 05/03/17 0531  . ipratropium-albuterol (DUONEB) 0.5-2.5 (3) MG/3ML nebulizer solution 3 mL  3 mL Nebulization Q4H PRN Smith, Rondell A, MD      . morphine 4 MG/ML injection 2 mg  2 mg Intravenous Q3H PRN Bodenheimer, Charles A, NP   2 mg at 05/03/17 0750  . ondansetron (ZOFRAN) injection 4 mg  4 mg Intravenous Q6H PRN Bodenheimer, Charles A, NP   4 mg at 05/03/17 0321  . pantoprazole (PROTONIX) injection 40 mg  40 mg Intravenous  Q24H Hosie Poisson, MD   40 mg at 05/03/17 1016    Allergies as of 05/02/2017 - Review Complete 05/02/2017  Allergen Reaction Noted  . Sulfa antibiotics  05/02/2017    Family History  Problem Relation Age of Onset  . Breast cancer Mother   . Hypertension Father   . Hypertension Sister     Social History   Socioeconomic History  . Marital status: Single    Spouse name: Not on file  . Number of children: Not on file  . Years of education: Not on file  . Highest education level: Not on file  Social Needs  . Financial resource strain: Not on file  . Food insecurity - worry: Not on file  . Food insecurity - inability: Not on file  . Transportation needs - medical: Not on file  . Transportation needs - non-medical: Not on file  Occupational History  . Not on file  Tobacco Use  . Smoking status: Former Research scientist (life sciences)  . Smokeless tobacco: Never Used  Substance and Sexual Activity  . Alcohol use: Yes    Frequency: Never  . Drug use: No  . Sexual activity: Not on file  Other Topics Concern  . Not on file  Social History Narrative  . Not on file    Review of Systems: All systems reviewed and negative except where noted in HPI.  Physical Exam: Vital signs in last 24 hours: Temp:  [98.2 F (36.8 C)-99.6 F (37.6 C)] 99.6 F (37.6 C) (03/01 0534) Pulse Rate:  [25-75] 74 (03/01 0534) Resp:  [16-28] 19 (03/01 0534) BP: (138-200)/(77-125) 155/85 (03/01 0534) SpO2:  [89 %-100 %] 96 % (03/01 0534) Weight:  [244 lb (110.7 kg)] 244 lb (110.7 kg) (02/28 1058) Last BM Date: 05/01/17 General:   Alert, well-developed, white male in NAD Psych:  Pleasant, cooperative. Normal mood and affect. Eyes:  Pupils equal, sclera clear, no icterus.   Conjunctiva pink. Ears:  Normal auditory acuity. Nose:  No deformity, discharge,  or lesions. Neck:  Supple; no masses Lungs:  Clear throughout to auscultation.   No wheezes, crackles, or rhonchi.  Heart:  Regular rate and rhythm; no murmurs, no  edema Abdomen:  Soft, non-distended, nontender, BS active, no palp mass    Rectal:  Deferred  Msk:  Symmetrical without gross deformities. . Neurologic:  Alert and  oriented x4;  grossly normal neurologically. Skin:  Intact without significant lesions or rashes..   Intake/Output from previous day: 02/28 0701 - 03/01 0700 In: 195.8 [I.V.:145.8; IV Piggyback:50] Out: -  Intake/Output this shift: No intake/output data recorded.  Lab Results: Recent Labs    05/02/17 1114 05/03/17 0539  WBC 11.7* 14.9*  HGB 15.7 14.2  HCT 45.9 42.3  PLT 284 269   BMET Recent Labs    05/02/17 1114 05/03/17 0539  NA 136 141  K 4.2 3.7  CL 100* 106  CO2 23 26  GLUCOSE 252* 144*  BUN 15 14  CREATININE  0.92 0.77  CALCIUM 9.2 8.6*   LFT Recent Labs    05/03/17 0539  PROT 6.4*  ALBUMIN 3.7  AST 137*  ALT 172*  ALKPHOS 68  BILITOT 1.9*     Studies/Results: Ct Abdomen Pelvis W Contrast  Result Date: 05/02/2017 CLINICAL DATA:  Acute generalized abdominal pain. EXAM: CT ABDOMEN AND PELVIS WITH CONTRAST TECHNIQUE: Multidetector CT imaging of the abdomen and pelvis was performed using the standard protocol following bolus administration of intravenous contrast. CONTRAST:  11m ISOVUE-300 IOPAMIDOL (ISOVUE-300) INJECTION 61% COMPARISON:  None. FINDINGS: Lower chest: No acute abnormality. Hepatobiliary: No focal liver abnormality is seen. No gallstones, gallbladder wall thickening, or biliary dilatation. Pancreas: Unremarkable. No pancreatic ductal dilatation or surrounding inflammatory changes. Spleen: Normal in size without focal abnormality. Adrenals/Urinary Tract: Adrenal glands are unremarkable. Kidneys are normal, without renal calculi, focal lesion, or hydronephrosis. Bladder is unremarkable. Stomach/Bowel: The appendix appears normal. There is no evidence of bowel obstruction. Focal wall thickening and inflammatory changes are seen involving the posterior wall of the gastric body, which  may represent peptic ulcer disease. Small amount of fluid is also seen posterior to second portion of duodenum. Vascular/Lymphatic: 3.3 cm infrarenal abdominal aortic aneurysm is noted. Atherosclerosis of abdominal aorta is noted. No adenopathy is noted. Reproductive: Prostate is unremarkable. Other: Small fat containing periumbilical hernia is noted. Musculoskeletal: No acute or significant osseous findings. IMPRESSION: Focal wall thickening and other inflammatory changes are seen along the posterior wall of gastric body concerning for possible peptic ulcer disease. Small amount of fluid is seen posterior to the second portion of duodenum which may be related to peptic ulcer disease is well. 3.3 cm infrarenal abdominal aortic aneurysm. Recommend followup by ultrasound in 3 years. This recommendation follows ACR consensus guidelines: White Paper of the ACR Incidental Findings Committee II on Vascular Findings. J Am Coll Radiol 2013; 10:789-794. Aortic Atherosclerosis (ICD10-I70.0). Electronically Signed   By: JMarijo Conception M.D.   On: 05/02/2017 13:20     PTye Savoy NP-C @  05/03/2017, 10:53 AM  Pager number [phone]  Attending physician's note   I have taken a history, examined the patient and reviewed the chart. I agree with the Advanced Practitioner's note, impression and recommendations. 72year old male presented with acute abdominal pain with elevation of lipase to 4000 and also has elevated AST, ALT and total bilirubin concerning for acute pancreatitis.  Alk phos is normal.  He takes occasional NSAIDs, predominantly BC powder.  CT scan showed posterior gastric wall thickening with concern for possible peptic ulcer disease.  Could explain elevated lipase but does not explain LFT abnormality. Will obtain MRCP to exclude CBD obstruction and any biliary etiology for pancreatitis.  Triglycerides level within normal limits.  Continue IV fluids N.p.o. Continue PPI Will need EGD to evaluate  gastric mucosa, will decide on timing based on MRCP findings  K VDenzil Magnuson MD [phone]-Fri 8a-5p [phone] 5p, weekends, holidays

## 2017-05-04 ENCOUNTER — Inpatient Hospital Stay (HOSPITAL_COMMUNITY): Payer: Medicare HMO

## 2017-05-04 DIAGNOSIS — K8020 Calculus of gallbladder without cholecystitis without obstruction: Secondary | ICD-10-CM

## 2017-05-04 LAB — COMPREHENSIVE METABOLIC PANEL
ALBUMIN: 3.2 g/dL — AB (ref 3.5–5.0)
ALK PHOS: 59 U/L (ref 38–126)
ALT: 92 U/L — ABNORMAL HIGH (ref 17–63)
ANION GAP: 8 (ref 5–15)
AST: 71 U/L — ABNORMAL HIGH (ref 15–41)
BUN: 16 mg/dL (ref 6–20)
CALCIUM: 8.2 mg/dL — AB (ref 8.9–10.3)
CHLORIDE: 108 mmol/L (ref 101–111)
CO2: 24 mmol/L (ref 22–32)
Creatinine, Ser: 0.78 mg/dL (ref 0.61–1.24)
GFR calc non Af Amer: >60 mL/min (ref >60)
GLUCOSE: 147 mg/dL — AB (ref 65–99)
POTASSIUM: 3.5 mmol/L (ref 3.5–5.1)
SODIUM: 140 mmol/L (ref 135–145)
Total Bilirubin: 1.9 mg/dL — ABNORMAL HIGH (ref 0.3–1.2)
Total Protein: 5.9 g/dL — ABNORMAL LOW (ref 6.5–8.1)

## 2017-05-04 LAB — GLUCOSE, CAPILLARY
GLUCOSE-CAPILLARY: 139 mg/dL — AB (ref 65–99)
Glucose-Capillary: 127 mg/dL — ABNORMAL HIGH (ref 65–99)
Glucose-Capillary: 143 mg/dL — ABNORMAL HIGH (ref 65–99)
Glucose-Capillary: 218 mg/dL — ABNORMAL HIGH (ref 65–99)

## 2017-05-04 MED ORDER — AMLODIPINE BESYLATE 5 MG PO TABS
5.0000 mg | ORAL_TABLET | Freq: Every day | ORAL | Status: DC
  Administered 2017-05-04 – 2017-05-06 (×3): 5 mg via ORAL
  Filled 2017-05-04 (×3): qty 1

## 2017-05-04 MED ORDER — HYDROMORPHONE HCL 1 MG/ML IJ SOLN
0.5000 mg | Freq: Once | INTRAMUSCULAR | Status: AC
  Administered 2017-05-04: 0.5 mg via INTRAVENOUS
  Filled 2017-05-04: qty 0.5

## 2017-05-04 MED ORDER — SIMETHICONE 80 MG PO CHEW
80.0000 mg | CHEWABLE_TABLET | Freq: Four times a day (QID) | ORAL | Status: DC | PRN
  Administered 2017-05-04 – 2017-05-05 (×4): 80 mg via ORAL
  Filled 2017-05-04 (×5): qty 1

## 2017-05-04 MED ORDER — MORPHINE SULFATE (PF) 4 MG/ML IV SOLN
3.0000 mg | INTRAVENOUS | Status: DC | PRN
  Administered 2017-05-04 – 2017-05-07 (×12): 3 mg via INTRAVENOUS
  Filled 2017-05-04 (×12): qty 1

## 2017-05-04 NOTE — Progress Notes (Signed)
PROGRESS NOTE    Nathan Baxter  UEA:[id number] DOB: 1946-01-20 DOA: 05/02/2017 PCP: Patient, No Pcp Per    Brief Narrative:  Nathan Baxter is a 72 y.o. male with medical history significant of HTN, hyperlipidemia, and prediabetes; who presents with complaints of abdominal pain.  Patient reports having acute onset of generalized crampy abdominal pain. Upon admission into the emergency department patient was noted to be afebrile, pulse in the 70s, blood pressure 138/77-, O2 saturations 200/110, and O2 saturations 89-100% on room air.  Labs reviewed significant for RBC 11.7, glucose 252,  lipase 3890, AST 469, ALT 272, and total bilirubin 2.  Urinalysis is negative for any acute abnormalities.  CT scan of the abdomen showed focal wall thickening and inflammatory changes along the posterior wall of the gastric body reported to be concerning for peptic ulcer and 3.3 cm infrarenal abdominal aortic aneurysm. He was admitted for acute pancreatitis and evaluation of PUD.       Assessment & Plan:   Principal Problem:   Pancreatitis Active Problems:   Leukocytosis   Hypertensive urgency   Abdominal aortic aneurysm (AAA) (HCC)   Hyperglycemia   Acute Pancreatitis of unclear etiology, possibly biliary pancreatitis.  Improving abdominal pain and nausea, slightly worse than on admission.  get MRCP AS per GI recommendations. MRCP shows cholelithiasis , no acute cholecystitis, inflammation of th ehead of the pancrease suggestive of acute pancreatitis.  Improving lipase and liver enzymes. Resume IV fluids, IV morphine and anti emetics.  Appreciate GI recommendations.  Start pt on clear liquid diet and advance as tolerated.  Will request surgery evaluation for elective cholecystectomy once pancreatitis improves.     Hyperglycemia:  Pt is prediabetic.  hgba1c is 6.3.  Resume SSI.    Leukocytosis;  Probably reactive. UA IS NEGATIVE.    Hypertensive urgency:  Added norvasc for bp. Prn  hydralazine    AAA:   Incidental finding on CT scan noted to be 3.3 cm.   -  Recommend outpatient follow-up and ultrasound per guidelines       DVT prophylaxis: lovenox.  Code Status: FULL CODE.  Family Communication: NONE AT BEDSIDE.  Disposition Plan: possible d/c home once pain is better and he is able to tolerate soft diet.    Consultants:   Gastroenterology.    Procedures: MRCP    Antimicrobials:none.    Subjective: Nausea, slightly worse this morning, better after the an ti emetics.    Objective: Vitals:   05/03/17 1449 05/03/17 2106 05/04/17 0619 05/04/17 1441  BP: (!) 143/81 (!) 156/71 (!) 153/73 (!) 188/85  Pulse: 79 76 75 73  Resp: 18 18 19 20   Temp: 98.7 F (37.1 C) 98.2 F (36.8 C) 100 F (37.8 C) 99.1 F (37.3 C)  TempSrc: Oral Oral Oral Oral  SpO2: 94% 95% 92% 97%  Weight:      Height:        Intake/Output Summary (Last 24 hours) at 05/04/2017 1516 Last data filed at 05/04/2017 0800 Gross per 24 hour  Intake 0 ml  Output -  Net 0 ml   Filed Weights   05/02/17 1058  Weight: 110.7 kg (244 lb)    Examination:  General exam: Appears uncomfortable.  Respiratory system: Clear to auscultation. Respiratory effort normal. No wheezing or rhonchi.  Cardiovascular system: S1 & S2 heard, RRR. No JVD, murmurs,No pedal edema. Gastrointestinal system: abd is soft , tender in the upper quadrant. non distended  Central nervous system: Alert and oriented. No focal neurological deficits.  Extremities: Symmetric 5 x 5 power. Skin: No rashes, lesions or ulcers Psychiatry:  Mood & affect appropriate.     Data Reviewed: I have personally reviewed following labs and imaging studies  CBC: Recent Labs  Lab 05/02/17 1114 05/03/17 0539  WBC 11.7* 14.9*  HGB 15.7 14.2  HCT 45.9 42.3  MCV 85.0 87.6  PLT 284 269   Basic Metabolic Panel: Recent Labs  Lab 05/02/17 1114 05/03/17 0539 05/04/17 1006  NA 136 141 140  K 4.2 3.7 3.5  CL 100* 106 108    CO2 23 26 24   GLUCOSE 252* 144* 147*  BUN 15 14 16   CREATININE 0.92 0.77 0.78  CALCIUM 9.2 8.6* 8.2*   GFR: Estimated Creatinine Clearance: 107.2 mL/min (by C-G formula based on SCr of 0.78 mg/dL). Liver Function Tests: Recent Labs  Lab 05/02/17 1114 05/03/17 0539 05/04/17 1006  AST 469* 137* 71*  ALT 272* 172* 92*  ALKPHOS 72 68 59  BILITOT 2.0* 1.9* 1.9*  PROT 7.8 6.4* 5.9*  ALBUMIN 4.3 3.7 3.2*   Recent Labs  Lab 05/02/17 1114 05/03/17 0539  LIPASE 3,890* 305*   No results for input(s): AMMONIA in the last 168 hours. Coagulation Profile: No results for input(s): INR, PROTIME in the last 168 hours. Cardiac Enzymes: No results for input(s): CKTOTAL, CKMB, CKMBINDEX, TROPONINI in the last 168 hours. BNP (last 3 results) No results for input(s): PROBNP in the last 8760 hours. HbA1C: Recent Labs    05/03/17 0539  HGBA1C 6.3*   CBG: Recent Labs  Lab 05/03/17 1210 05/03/17 1806 05/04/17 0011 05/04/17 0617 05/04/17 1219  GLUCAP 154* 141* 127* 139* 143*   Lipid Profile: Recent Labs    05/02/17 2145  CHOL 176  HDL 71  LDLCALC 91  TRIG 69  CHOLHDL 2.5   Thyroid Function Tests: No results for input(s): TSH, T4TOTAL, FREET4, T3FREE, THYROIDAB in the last 72 hours. Anemia Panel: No results for input(s): VITAMINB12, FOLATE, FERRITIN, TIBC, IRON, RETICCTPCT in the last 72 hours. Sepsis Labs: No results for input(s): PROCALCITON, LATICACIDVEN in the last 168 hours.  No results found for this or any previous visit (from the past 240 hour(s)).       Radiology Studies: US Abdomen Complete  Result Date: 05/03/2017 CLINICAL DATA:  72 year old male with history of pancreatitis. EXAM: ABDOMEN ULTRASOUND COMPLETE COMPARISON:  No prior abdominal ultrasound. CT the abdomen and pelvis 05/02/2017. FINDINGS: Gallbladder: No gallstones or wall thickening visualized. No sonographic Murphy sign noted by sonographer. Common bile duct: Diameter: 4.8 mm Liver: No focal  lesion identified. Within normal limits in parenchymal echogenicity. Portal vein is patent on color Doppler imaging with normal direction of blood flow towards the liver. IVC: No abnormality visualized. Pancreas: Poorly visualized. Spleen: Size and appearance within normal limits. Right Kidney: Length: 12.1 cm. Echogenicity within normal limits. No mass or hydronephrosis visualized. Left Kidney: Length: 12.2 cm. Echogenicity within normal limits. No mass or hydronephrosis visualized. Abdominal aorta: Fusiform dilatation of the infrarenal abdominal aorta measuring up to 3.1 cm in diameter. Other findings: None. IMPRESSION: 1. Pancreas is poorly visualized on today's examination secondary to overlying bowel gas. 2. No acute findings. 3. Small infrarenal abdominal aortic aneurysm measuring 3.1 cm in diameter. Recommend followup by ultrasound in 3 years. This recommendation follows ACR consensus guidelines: White Paper of the ACR Incidental Findings Committee II on Vascular Findings. J Am Coll Radiol 2013; 10:789-794. Electronically Signed   By: Trudie Reed M.D.   On: 05/03/2017 15:38  Mr Abdomen Mrcp Wo Contrast  Result Date: 05/04/2017 CLINICAL DATA:  72 year old male with history of abnormal liver function tests. EXAM: MRI ABDOMEN WITHOUT CONTRAST  (INCLUDING MRCP) TECHNIQUE: Multiplanar multisequence MR imaging of the abdomen was performed. Heavily T2-weighted images of the biliary and pancreatic ducts were obtained, and three-dimensional MRCP images were rendered by post processing. COMPARISON:  No prior abdominal MRI. CT the abdomen and pelvis 05/02/2017. FINDINGS: Lower chest: Moderate right pleural effusion lying dependently. Increased signal intensity in portions of the right lower lobe dependently. Hepatobiliary: Diffuse loss of signal intensity throughout the hepatic parenchyma, indicative of hepatic steatosis. No suspicious cystic or solid hepatic lesions noted on today's noncontrast examination. No  intra or extrahepatic biliary ductal dilatation on MRCP images. Common bile duct measures 6 mm in the porta hepatis. No filling defects along the common bile duct to suggest choledocholithiasis. Several tiny filling defects within the gallbladder, compatible with small gallstones. No surrounding inflammatory changes to suggest an acute cholecystitis at this time. Pancreas: No definite pancreatic mass appreciated on today's noncontrast examination. Increased T2 signal intensity adjacent to the head and proximal body of the pancreas, concerning for an acute pancreatitis. No pancreatic ductal dilatation noted on MRCP images. No well-defined peripancreatic fluid collections are noted. Spleen:  Unremarkable. Adrenals/Urinary Tract: Unenhanced appearance of the kidneys and bilateral adrenal glands is unremarkable. Stomach/Bowel: No hydroureteronephrosis in the visualized portions of the abdomen. Vascular/Lymphatic: Fusiform aneurysmal dilatation of the infrarenal abdominal aorta measuring 2.9 x 3.5 cm. No lymphadenopathy noted in the abdomen. Other: Increased fluid signal intensity throughout the right side of the retroperitoneum, presumably related to acute pancreatitis. Trace volume of ascites in the right side of the abdomen. Musculoskeletal: No aggressive appearing osseous lesions are noted in the visualized portions of the skeleton. IMPRESSION: 1. No signs of biliary tract obstruction. 2. Cholelithiasis without evidence of acute cholecystitis at this time. 3. Inflammatory changes adjacent to the head and proximal body of the pancreas concerning for an acute pancreatitis. 4. Moderate right pleural effusion lying dependently with some associated passive subsegmental atelectasis in the right lower lobe. 5. Hepatic steatosis. Electronically Signed   By: Trudie Reed M.D.   On: 05/04/2017 12:19   Mr 3d Recon At Scanner  Result Date: 05/04/2017 CLINICAL DATA:  72 year old male with history of abnormal liver function  tests. EXAM: MRI ABDOMEN WITHOUT CONTRAST  (INCLUDING MRCP) TECHNIQUE: Multiplanar multisequence MR imaging of the abdomen was performed. Heavily T2-weighted images of the biliary and pancreatic ducts were obtained, and three-dimensional MRCP images were rendered by post processing. COMPARISON:  No prior abdominal MRI. CT the abdomen and pelvis 05/02/2017. FINDINGS: Lower chest: Moderate right pleural effusion lying dependently. Increased signal intensity in portions of the right lower lobe dependently. Hepatobiliary: Diffuse loss of signal intensity throughout the hepatic parenchyma, indicative of hepatic steatosis. No suspicious cystic or solid hepatic lesions noted on today's noncontrast examination. No intra or extrahepatic biliary ductal dilatation on MRCP images. Common bile duct measures 6 mm in the porta hepatis. No filling defects along the common bile duct to suggest choledocholithiasis. Several tiny filling defects within the gallbladder, compatible with small gallstones. No surrounding inflammatory changes to suggest an acute cholecystitis at this time. Pancreas: No definite pancreatic mass appreciated on today's noncontrast examination. Increased T2 signal intensity adjacent to the head and proximal body of the pancreas, concerning for an acute pancreatitis. No pancreatic ductal dilatation noted on MRCP images. No well-defined peripancreatic fluid collections are noted. Spleen:  Unremarkable. Adrenals/Urinary Tract: Unenhanced  appearance of the kidneys and bilateral adrenal glands is unremarkable. Stomach/Bowel: No hydroureteronephrosis in the visualized portions of the abdomen. Vascular/Lymphatic: Fusiform aneurysmal dilatation of the infrarenal abdominal aorta measuring 2.9 x 3.5 cm. No lymphadenopathy noted in the abdomen. Other: Increased fluid signal intensity throughout the right side of the retroperitoneum, presumably related to acute pancreatitis. Trace volume of ascites in the right side of the  abdomen. Musculoskeletal: No aggressive appearing osseous lesions are noted in the visualized portions of the skeleton. IMPRESSION: 1. No signs of biliary tract obstruction. 2. Cholelithiasis without evidence of acute cholecystitis at this time. 3. Inflammatory changes adjacent to the head and proximal body of the pancreas concerning for an acute pancreatitis. 4. Moderate right pleural effusion lying dependently with some associated passive subsegmental atelectasis in the right lower lobe. 5. Hepatic steatosis. Electronically Signed   By: Trudie Reedaniel  Entrikin M.D.   On: 05/04/2017 12:19        Scheduled Meds: . enoxaparin (LOVENOX) injection  40 mg Subcutaneous Q24H  . insulin aspart  0-7 Units Subcutaneous Q6H  . pantoprazole (PROTONIX) IV  40 mg Intravenous Q24H   Continuous Infusions: . sodium chloride 125 mL/hr at 05/04/17 1348     LOS: 2 days    Time spent: 35 min    Kathlen ModyVijaya Caryn Gienger, MD [facility] Pager [phone] If 7PM-7AM, please contact night-coverage [email or web address] Password [email or web address] 05/04/2017, 3:16 PM

## 2017-05-04 NOTE — Progress Notes (Addendum)
[facility] Progress Note   Chief Complaint:   Pancreatitis, ? PUD  SUBJECTIVE:    doesn't feel as well this am as he did yesterday. Abdomen feels sore and nauseated.   ASSESSMENT AND PLAN:   1. 72 yo male with acute pancreatitis, possibly biliary given concurrent abnormal liver chemistries. Triglycerides normal. No cholelithiasis or CBD dilation on CT scan. Interestingly no findings of pancreatitis on imaging either. Lipase down in 300 range now. He doesn't feel as well today with nausea and mild abd discomfort.  -I ordered CMET to see if liver labs have continued to improve.  -MRCP ordered yesterday, not yet done -Probably trial of clears after MRCP  2. ? PUD on CT scan.  -Findings probably related to pancreatitis but may still need EGD at some point to evaluate CT scan findings.  -continue daily PPI for now. Change to pill when taking PO   OBJECTIVE:     Vital signs in last 24 hours: Temp:  [98.2 F (36.8 C)-100 F (37.8 C)] 100 F (37.8 C) (03/02 0619) Pulse Rate:  [75-79] 75 (03/02 0619) Resp:  [18-19] 19 (03/02 0619) BP: (143-156)/(71-81) 153/73 (03/02 0619) SpO2:  [92 %-95 %] 92 % (03/02 0619) Last BM Date: 05/01/17 General:   Alert, well-developed, white male in NAD EENT:  Normal hearing, non icteric sclera, conjunctive pink.  Heart:  Regular rate and rhythm. No lower extremity edema Pulm: Normal respiratory effort, lungs CTA bilaterally without wheezes or crackles. Abdomen:  Soft, nondistended, mild-mod RUQ tenderness. Hypoactive bowel sounds, no masses felt.   Neurologic:  Alert and  oriented x4;  grossly normal neurologically. Psych:  Pleasant, cooperative.  Normal mood and affect.   Intake/Output from previous day: No intake/output data recorded. Intake/Output this shift: No intake/output data recorded.  Lab Results: Recent Labs    05/02/17 1114 05/03/17 0539  WBC 11.7* 14.9*  HGB 15.7 14.2  HCT 45.9 42.3  PLT 284 269    BMET Recent Labs    05/02/17 1114 05/03/17 0539  NA 136 141  K 4.2 3.7  CL 100* 106  CO2 23 26  GLUCOSE 252* 144*  BUN 15 14  CREATININE 0.92 0.77  CALCIUM 9.2 8.6*   LFT Recent Labs    05/03/17 0539  PROT 6.4*  ALBUMIN 3.7  AST 137*  ALT 172*  ALKPHOS 68  BILITOT 1.9*   PT/INR No results for input(s): LABPROT, INR in the last 72 hours. Hepatitis Panel No results for input(s): HEPBSAG, HCVAB, HEPAIGM, HEPBIGM in the last 72 hours.  US Abdomen Complete  Result Date: 05/03/2017 CLINICAL DATA:  72 year old male with history of pancreatitis. EXAM: ABDOMEN ULTRASOUND COMPLETE COMPARISON:  No prior abdominal ultrasound. CT the abdomen and pelvis 05/02/2017. FINDINGS: Gallbladder: No gallstones or wall thickening visualized. No sonographic Murphy sign noted by sonographer. Common bile duct: Diameter: 4.8 mm Liver: No focal lesion identified. Within normal limits in parenchymal echogenicity. Portal vein is patent on color Doppler imaging with normal direction of blood flow towards the liver. IVC: No abnormality visualized. Pancreas: Poorly visualized. Spleen: Size and appearance within normal limits. Right Kidney: Length: 12.1 cm. Echogenicity within normal limits. No mass or hydronephrosis visualized. Left Kidney: Length: 12.2 cm. Echogenicity within normal limits. No mass or hydronephrosis visualized. Abdominal aorta: Fusiform dilatation of the infrarenal abdominal aorta measuring up to 3.1 cm in diameter. Other findings: None. IMPRESSION: 1. Pancreas is poorly visualized on today's examination secondary to overlying bowel gas. 2. No acute findings. 3.  Small infrarenal abdominal aortic aneurysm measuring 3.1 cm in diameter. Recommend followup by ultrasound in 3 years. This recommendation follows ACR consensus guidelines: White Paper of the ACR Incidental Findings Committee II on Vascular Findings. J Am Coll Radiol 2013; 10:789-794. Electronically Signed   By: Trudie Reedaniel  Entrikin M.D.   On:  05/03/2017 15:38   Ct Abdomen Pelvis W Contrast  Result Date: 05/02/2017 CLINICAL DATA:  Acute generalized abdominal pain. EXAM: CT ABDOMEN AND PELVIS WITH CONTRAST TECHNIQUE: Multidetector CT imaging of the abdomen and pelvis was performed using the standard protocol following bolus administration of intravenous contrast. CONTRAST:  100mL ISOVUE-300 IOPAMIDOL (ISOVUE-300) INJECTION 61% COMPARISON:  None. FINDINGS: Lower chest: No acute abnormality. Hepatobiliary: No focal liver abnormality is seen. No gallstones, gallbladder wall thickening, or biliary dilatation. Pancreas: Unremarkable. No pancreatic ductal dilatation or surrounding inflammatory changes. Spleen: Normal in size without focal abnormality. Adrenals/Urinary Tract: Adrenal glands are unremarkable. Kidneys are normal, without renal calculi, focal lesion, or hydronephrosis. Bladder is unremarkable. Stomach/Bowel: The appendix appears normal. There is no evidence of bowel obstruction. Focal wall thickening and inflammatory changes are seen involving the posterior wall of the gastric body, which may represent peptic ulcer disease. Small amount of fluid is also seen posterior to second portion of duodenum. Vascular/Lymphatic: 3.3 cm infrarenal abdominal aortic aneurysm is noted. Atherosclerosis of abdominal aorta is noted. No adenopathy is noted. Reproductive: Prostate is unremarkable. Other: Small fat containing periumbilical hernia is noted. Musculoskeletal: No acute or significant osseous findings. IMPRESSION: Focal wall thickening and other inflammatory changes are seen along the posterior wall of gastric body concerning for possible peptic ulcer disease. Small amount of fluid is seen posterior to the second portion of duodenum which may be related to peptic ulcer disease is well. 3.3 cm infrarenal abdominal aortic aneurysm. Recommend followup by ultrasound in 3 years. This recommendation follows ACR consensus guidelines: White Paper of the ACR  Incidental Findings Committee II on Vascular Findings. J Am Coll Radiol 2013; 10:789-794. Aortic Atherosclerosis (ICD10-I70.0). Electronically Signed   By: Lupita RaiderJames  Green Jr, M.D.   On: 05/02/2017 13:20      Principal Problem:   Pancreatitis Active Problems:   Leukocytosis   Hypertensive urgency   Abdominal aortic aneurysm (AAA) (HCC)   Hyperglycemia     LOS: 2 days   Willette ClusterPaula Guenther ,NP 05/04/2017, 9:35 AM  Pager number [phone]    Attending physician's note   I have taken an interval history, reviewed the chart and examined the patient. I agree with the Advanced Practitioner's note, impression and recommendations.   MRCP showed cholelithiasis, no cbd stone or dilation.  ?biliary pancreatitis, could have passed the stone LFT improving Continue IV fluids and supportive care Consult surgery for possible cholecystectomy once pancreatitis improves to prevent recurrent episode  K Scherry RanVeena Nandigam, MD [phone] Mon-Fri 8a-5p [phone] after 5p, weekends, holidays

## 2017-05-05 DIAGNOSIS — K8010 Calculus of gallbladder with chronic cholecystitis without obstruction: Secondary | ICD-10-CM | POA: Insufficient documentation

## 2017-05-05 LAB — CBC
HEMATOCRIT: 38.6 % — AB (ref 39.0–52.0)
HEMOGLOBIN: 12.7 g/dL — AB (ref 13.0–17.0)
MCH: 29.1 pg (ref 26.0–34.0)
MCHC: 32.9 g/dL (ref 30.0–36.0)
MCV: 88.5 fL (ref 78.0–100.0)
Platelets: 215 10*3/uL (ref 150–400)
RBC: 4.36 MIL/uL (ref 4.22–5.81)
RDW: 13.8 % (ref 11.5–15.5)
WBC: 13.9 10*3/uL — ABNORMAL HIGH (ref 4.0–10.5)

## 2017-05-05 LAB — GLUCOSE, CAPILLARY
GLUCOSE-CAPILLARY: 129 mg/dL — AB (ref 65–99)
GLUCOSE-CAPILLARY: 141 mg/dL — AB (ref 65–99)
GLUCOSE-CAPILLARY: 148 mg/dL — AB (ref 65–99)
Glucose-Capillary: 128 mg/dL — ABNORMAL HIGH (ref 65–99)

## 2017-05-05 LAB — SURGICAL PCR SCREEN
MRSA, PCR: NEGATIVE
Staphylococcus aureus: NEGATIVE

## 2017-05-05 MED ORDER — VITAMIN B-12 1000 MCG PO TABS
500.0000 ug | ORAL_TABLET | Freq: Every day | ORAL | Status: DC
  Administered 2017-05-05 – 2017-05-06 (×2): 500 ug via ORAL
  Filled 2017-05-05 (×2): qty 1

## 2017-05-05 MED ORDER — GABAPENTIN 300 MG PO CAPS
300.0000 mg | ORAL_CAPSULE | ORAL | Status: AC
  Administered 2017-05-06: 300 mg via ORAL
  Filled 2017-05-05: qty 1

## 2017-05-05 MED ORDER — METRONIDAZOLE IN NACL 5-0.79 MG/ML-% IV SOLN
500.0000 mg | INTRAVENOUS | Status: AC
  Administered 2017-05-06: 500 mg via INTRAVENOUS
  Filled 2017-05-05 (×2): qty 100

## 2017-05-05 MED ORDER — OMEGA-3-ACID ETHYL ESTERS 1 G PO CAPS
1.0000 g | ORAL_CAPSULE | Freq: Two times a day (BID) | ORAL | Status: DC
  Administered 2017-05-05: 1 g via ORAL
  Filled 2017-05-05 (×2): qty 1

## 2017-05-05 MED ORDER — ADULT MULTIVITAMIN W/MINERALS CH
1.0000 | ORAL_TABLET | Freq: Every day | ORAL | Status: DC
  Administered 2017-05-05: 1 via ORAL
  Filled 2017-05-05 (×3): qty 1

## 2017-05-05 MED ORDER — ACETAMINOPHEN 500 MG PO TABS
1000.0000 mg | ORAL_TABLET | ORAL | Status: AC
  Administered 2017-05-06: 1000 mg via ORAL
  Filled 2017-05-05: qty 2

## 2017-05-05 MED ORDER — CHLORHEXIDINE GLUCONATE CLOTH 2 % EX PADS
6.0000 | MEDICATED_PAD | Freq: Once | CUTANEOUS | Status: AC
  Administered 2017-05-06: 6 via TOPICAL

## 2017-05-05 MED ORDER — FOLIC ACID 1 MG PO TABS
1.0000 mg | ORAL_TABLET | Freq: Every day | ORAL | Status: DC
  Administered 2017-05-05 – 2017-05-06 (×2): 1 mg via ORAL
  Filled 2017-05-05 (×2): qty 1

## 2017-05-05 MED ORDER — CHLORHEXIDINE GLUCONATE CLOTH 2 % EX PADS
6.0000 | MEDICATED_PAD | Freq: Once | CUTANEOUS | Status: AC
  Administered 2017-05-05: 6 via TOPICAL

## 2017-05-05 MED ORDER — ASPIRIN EC 81 MG PO TBEC
81.0000 mg | DELAYED_RELEASE_TABLET | Freq: Every day | ORAL | Status: DC
  Administered 2017-05-05 – 2017-05-06 (×2): 81 mg via ORAL
  Filled 2017-05-05 (×2): qty 1

## 2017-05-05 MED ORDER — CEFTRIAXONE SODIUM 2 G IJ SOLR
2.0000 g | INTRAMUSCULAR | Status: AC
  Administered 2017-05-06: 2 g via INTRAVENOUS
  Filled 2017-05-05: qty 20

## 2017-05-05 NOTE — Progress Notes (Signed)
PROGRESS NOTE    Nathan SciaraRobert Baxter  BMW:[id number] DOB: 05/13/1945 DOA: 05/02/2017 PCP: Patient, No Pcp Per    Brief Narrative:  Nathan Baxter is a 72 y.o. male with medical history significant of HTN, hyperlipidemia, and prediabetes; who presents with complaints of abdominal pain.  Patient reports having acute onset of generalized crampy abdominal pain. Upon admission into the emergency department patient was noted to be afebrile, pulse in the 70s, blood pressure 138/77-, O2 saturations 200/110, and O2 saturations 89-100% on room air.  Labs reviewed significant for RBC 11.7, glucose 252,  lipase 3890, AST 469, ALT 272, and total bilirubin 2.  Urinalysis is negative for any acute abnormalities.  CT scan of the abdomen showed focal wall thickening and inflammatory changes along the posterior wall of the gastric body reported to be concerning for peptic ulcer and 3.3 cm infrarenal abdominal aortic aneurysm. He was admitted for acute pancreatitis and evaluation of PUD.       Assessment & Plan:   Principal Problem:   Gallstone pancreatitis Active Problems:   Leukocytosis   Hypertensive urgency   Abdominal aortic aneurysm (AAA) (HCC)   Hyperglycemia   Chronic cholecystitis with calculus   Acute Pancreatitis of unclear etiology, possibly biliary pancreatitis.  Improving abdominal pain and nausea, but not back to baseline.  MRCP shows cholelithiasis , no acute cholecystitis, inflammation of the head of the pancrease suggestive of acute pancreatitis.  Improving lipase and liver enzymes. Resume IV fluids, IV morphine and anti emetics.  Appreciate GI recommendations.  Start pt on clear liquid diet and advance as tolerated.  Will request surgery evaluation for elective cholecystectomy once pancreatitis improves.  Currently still on clears though he reports some pain and nausea today. Better than yesterday.     Hyperglycemia:  Pt is prediabetic.  hgba1c is 6.3.  Resume SSI.  CBG (last 3)    Recent Labs    05/04/17 1809 05/05/17 0007 05/05/17 0531  GLUCAP 218* 148* 141*      Leukocytosis;  Probably reactive. UA IS NEGATIVE.    Hypertensive urgency:  Slightly better parameters.  Added norvasc for bp. Prn hydralazine    AAA:   Incidental finding on CT scan noted to be 3.3 cm.   -  Recommend outpatient follow-up and ultrasound per guidelines    Leukocytosis Possibly from pancreatitis.    DVT prophylaxis: lovenox.  Code Status: FULL CODE.  Family Communication: NONE AT BEDSIDE.  Disposition Plan: possible d/c home once pain is better and he is able to tolerate soft diet.    Consultants:   Gastroenterology.    Procedures: MRCP    Antimicrobials:none.    Subjective: Nausea, slightly worse this morning, better after the an ti emetics.    Objective: Vitals:   05/04/17 1653 05/04/17 1812 05/04/17 2137 05/05/17 0533  BP: (!) 166/83 (!) 177/81 (!) 164/88 (!) 169/80  Pulse: 80 85 80 76  Resp:   17 18  Temp:  99.8 F (37.7 C) 99.1 F (37.3 C) 98.6 F (37 C)  TempSrc:  Oral Oral Oral  SpO2:   95% 96%  Weight:      Height:        Intake/Output Summary (Last 24 hours) at 05/05/2017 1126 Last data filed at 05/05/2017 0533 Gross per 24 hour  Intake 3180 ml  Output -  Net 3180 ml   Filed Weights   05/02/17 1058  Weight: 110.7 kg (244 lb)    Examination:  General exam: Appears uncomfortable. Not in distress.  Respiratory system: good air entry bilateral.  Cardiovascular system: S1 & S2 heard. RRR.  Gastrointestinal system: abd is soft , mildly distended, mildly tender,  bowel sounds heard.  Central nervous system: Alert and oriented. No focal neurological deficits. Extremities: Symmetric 5 x 5 power.no pedal edema.  Skin: No rashes, lesions or ulcers Psychiatry:  Mood & affect appropriate.     Data Reviewed: I have personally reviewed following labs and imaging studies  CBC: Recent Labs  Lab 05/02/17 1114 05/03/17 0539  05/05/17 0543  WBC 11.7* 14.9* 13.9*  HGB 15.7 14.2 12.7*  HCT 45.9 42.3 38.6*  MCV 85.0 87.6 88.5  PLT 284 269 215   Basic Metabolic Panel: Recent Labs  Lab 05/02/17 1114 05/03/17 0539 05/04/17 1006  NA 136 141 140  K 4.2 3.7 3.5  CL 100* 106 108  CO2 23 26 24   GLUCOSE 252* 144* 147*  BUN 15 14 16   CREATININE 0.92 0.77 0.78  CALCIUM 9.2 8.6* 8.2*   GFR: Estimated Creatinine Clearance: 107.2 mL/min (by C-G formula based on SCr of 0.78 mg/dL). Liver Function Tests: Recent Labs  Lab 05/02/17 1114 05/03/17 0539 05/04/17 1006  AST 469* 137* 71*  ALT 272* 172* 92*  ALKPHOS 72 68 59  BILITOT 2.0* 1.9* 1.9*  PROT 7.8 6.4* 5.9*  ALBUMIN 4.3 3.7 3.2*   Recent Labs  Lab 05/02/17 1114 05/03/17 0539  LIPASE 3,890* 305*   No results for input(s): AMMONIA in the last 168 hours. Coagulation Profile: No results for input(s): INR, PROTIME in the last 168 hours. Cardiac Enzymes: No results for input(s): CKTOTAL, CKMB, CKMBINDEX, TROPONINI in the last 168 hours. BNP (last 3 results) No results for input(s): PROBNP in the last 8760 hours. HbA1C: Recent Labs    05/03/17 0539  HGBA1C 6.3*   CBG: Recent Labs  Lab 05/04/17 0617 05/04/17 1219 05/04/17 1809 05/05/17 0007 05/05/17 0531  GLUCAP 139* 143* 218* 148* 141*   Lipid Profile: Recent Labs    05/02/17 2145  CHOL 176  HDL 71  LDLCALC 91  TRIG 69  CHOLHDL 2.5   Thyroid Function Tests: No results for input(s): TSH, T4TOTAL, FREET4, T3FREE, THYROIDAB in the last 72 hours. Anemia Panel: No results for input(s): VITAMINB12, FOLATE, FERRITIN, TIBC, IRON, RETICCTPCT in the last 72 hours. Sepsis Labs: No results for input(s): PROCALCITON, LATICACIDVEN in the last 168 hours.  No results found for this or any previous visit (from the past 240 hour(s)).       Radiology Studies: US Abdomen Complete  Result Date: 05/03/2017 CLINICAL DATA:  72 year old male with history of pancreatitis. EXAM: ABDOMEN  ULTRASOUND COMPLETE COMPARISON:  No prior abdominal ultrasound. CT the abdomen and pelvis 05/02/2017. FINDINGS: Gallbladder: No gallstones or wall thickening visualized. No sonographic Murphy sign noted by sonographer. Common bile duct: Diameter: 4.8 mm Liver: No focal lesion identified. Within normal limits in parenchymal echogenicity. Portal vein is patent on color Doppler imaging with normal direction of blood flow towards the liver. IVC: No abnormality visualized. Pancreas: Poorly visualized. Spleen: Size and appearance within normal limits. Right Kidney: Length: 12.1 cm. Echogenicity within normal limits. No mass or hydronephrosis visualized. Left Kidney: Length: 12.2 cm. Echogenicity within normal limits. No mass or hydronephrosis visualized. Abdominal aorta: Fusiform dilatation of the infrarenal abdominal aorta measuring up to 3.1 cm in diameter. Other findings: None. IMPRESSION: 1. Pancreas is poorly visualized on today's examination secondary to overlying bowel gas. 2. No acute findings. 3. Small infrarenal abdominal aortic aneurysm measuring 3.1 cm  in diameter. Recommend followup by ultrasound in 3 years. This recommendation follows ACR consensus guidelines: White Paper of the ACR Incidental Findings Committee II on Vascular Findings. J Am Coll Radiol 2013; 10:789-794. Electronically Signed   By: Trudie Reed M.D.   On: 05/03/2017 15:38   Mr Abdomen Mrcp Wo Contrast  Result Date: 05/04/2017 CLINICAL DATA:  72 year old male with history of abnormal liver function tests. EXAM: MRI ABDOMEN WITHOUT CONTRAST  (INCLUDING MRCP) TECHNIQUE: Multiplanar multisequence MR imaging of the abdomen was performed. Heavily T2-weighted images of the biliary and pancreatic ducts were obtained, and three-dimensional MRCP images were rendered by post processing. COMPARISON:  No prior abdominal MRI. CT the abdomen and pelvis 05/02/2017. FINDINGS: Lower chest: Moderate right pleural effusion lying dependently. Increased  signal intensity in portions of the right lower lobe dependently. Hepatobiliary: Diffuse loss of signal intensity throughout the hepatic parenchyma, indicative of hepatic steatosis. No suspicious cystic or solid hepatic lesions noted on today's noncontrast examination. No intra or extrahepatic biliary ductal dilatation on MRCP images. Common bile duct measures 6 mm in the porta hepatis. No filling defects along the common bile duct to suggest choledocholithiasis. Several tiny filling defects within the gallbladder, compatible with small gallstones. No surrounding inflammatory changes to suggest an acute cholecystitis at this time. Pancreas: No definite pancreatic mass appreciated on today's noncontrast examination. Increased T2 signal intensity adjacent to the head and proximal body of the pancreas, concerning for an acute pancreatitis. No pancreatic ductal dilatation noted on MRCP images. No well-defined peripancreatic fluid collections are noted. Spleen:  Unremarkable. Adrenals/Urinary Tract: Unenhanced appearance of the kidneys and bilateral adrenal glands is unremarkable. Stomach/Bowel: No hydroureteronephrosis in the visualized portions of the abdomen. Vascular/Lymphatic: Fusiform aneurysmal dilatation of the infrarenal abdominal aorta measuring 2.9 x 3.5 cm. No lymphadenopathy noted in the abdomen. Other: Increased fluid signal intensity throughout the right side of the retroperitoneum, presumably related to acute pancreatitis. Trace volume of ascites in the right side of the abdomen. Musculoskeletal: No aggressive appearing osseous lesions are noted in the visualized portions of the skeleton. IMPRESSION: 1. No signs of biliary tract obstruction. 2. Cholelithiasis without evidence of acute cholecystitis at this time. 3. Inflammatory changes adjacent to the head and proximal body of the pancreas concerning for an acute pancreatitis. 4. Moderate right pleural effusion lying dependently with some associated  passive subsegmental atelectasis in the right lower lobe. 5. Hepatic steatosis. Electronically Signed   By: Trudie Reed M.D.   On: 05/04/2017 12:19   Mr 3d Recon At Scanner  Result Date: 05/04/2017 CLINICAL DATA:  72 year old male with history of abnormal liver function tests. EXAM: MRI ABDOMEN WITHOUT CONTRAST  (INCLUDING MRCP) TECHNIQUE: Multiplanar multisequence MR imaging of the abdomen was performed. Heavily T2-weighted images of the biliary and pancreatic ducts were obtained, and three-dimensional MRCP images were rendered by post processing. COMPARISON:  No prior abdominal MRI. CT the abdomen and pelvis 05/02/2017. FINDINGS: Lower chest: Moderate right pleural effusion lying dependently. Increased signal intensity in portions of the right lower lobe dependently. Hepatobiliary: Diffuse loss of signal intensity throughout the hepatic parenchyma, indicative of hepatic steatosis. No suspicious cystic or solid hepatic lesions noted on today's noncontrast examination. No intra or extrahepatic biliary ductal dilatation on MRCP images. Common bile duct measures 6 mm in the porta hepatis. No filling defects along the common bile duct to suggest choledocholithiasis. Several tiny filling defects within the gallbladder, compatible with small gallstones. No surrounding inflammatory changes to suggest an acute cholecystitis at this time. Pancreas:  No definite pancreatic mass appreciated on today's noncontrast examination. Increased T2 signal intensity adjacent to the head and proximal body of the pancreas, concerning for an acute pancreatitis. No pancreatic ductal dilatation noted on MRCP images. No well-defined peripancreatic fluid collections are noted. Spleen:  Unremarkable. Adrenals/Urinary Tract: Unenhanced appearance of the kidneys and bilateral adrenal glands is unremarkable. Stomach/Bowel: No hydroureteronephrosis in the visualized portions of the abdomen. Vascular/Lymphatic: Fusiform aneurysmal dilatation  of the infrarenal abdominal aorta measuring 2.9 x 3.5 cm. No lymphadenopathy noted in the abdomen. Other: Increased fluid signal intensity throughout the right side of the retroperitoneum, presumably related to acute pancreatitis. Trace volume of ascites in the right side of the abdomen. Musculoskeletal: No aggressive appearing osseous lesions are noted in the visualized portions of the skeleton. IMPRESSION: 1. No signs of biliary tract obstruction. 2. Cholelithiasis without evidence of acute cholecystitis at this time. 3. Inflammatory changes adjacent to the head and proximal body of the pancreas concerning for an acute pancreatitis. 4. Moderate right pleural effusion lying dependently with some associated passive subsegmental atelectasis in the right lower lobe. 5. Hepatic steatosis. Electronically Signed   By: Trudie Reed M.D.   On: 05/04/2017 12:19        Scheduled Meds: . [START ON 05/06/2017] acetaminophen  1,000 mg Oral On Call to OR  . amLODipine  5 mg Oral Daily  . aspirin EC  81 mg Oral Daily  . Chlorhexidine Gluconate Cloth  6 each Topical Once   And  . [START ON 05/06/2017] Chlorhexidine Gluconate Cloth  6 each Topical Once  . enoxaparin (LOVENOX) injection  40 mg Subcutaneous Q24H  . folic acid  1 mg Oral Daily  . [START ON 05/06/2017] gabapentin  300 mg Oral On Call to OR  . insulin aspart  0-7 Units Subcutaneous Q6H  . multivitamin with minerals  1 tablet Oral Daily  . omega-3 acid ethyl esters  1 g Oral BID  . pantoprazole (PROTONIX) IV  40 mg Intravenous Q24H  . cyanocobalamin  500 mcg Oral Daily   Continuous Infusions: . sodium chloride 125 mL/hr at 05/04/17 2152  . [START ON 05/06/2017] cefTRIAXone (ROCEPHIN)  IV     And  . [START ON 05/06/2017] metronidazole       LOS: 3 days    Time spent: 35 min    Kathlen Mody, MD [facility] Pager [phone] If 7PM-7AM, please contact night-coverage [email or web address] Password [facility] 05/05/2017, 11:26 AM

## 2017-05-05 NOTE — Progress Notes (Signed)
Pt c/o gas pain and pain throughout abdomen. PRN pain medication and simethicone given. Patient encouraged to walk in the halls to help relieve gas pain. Patient is reluctant to walk. Will continue to monitor.

## 2017-05-05 NOTE — Progress Notes (Addendum)
[facility] Progress Note   Chief Complaint:  pancreatitis    SUBJECTIVE:    feels better than yesterday but still requiring pain medications. Nausea is better. Had some italian ice and other liquids last night and developed abdominal discomfort .   ASSESSMENT AND PLAN:   1. 72 yo male with acute pancreatitis, probably biliary in origin given concurrent cholelithiasis / abnormal liver chemistries. MRCP remarkable for cholelithiasis without choledocholithiasis.  Suspect passed stone. Transaminases normalizing, bili still 1.9 but can lag behind. The posterior gastric wall findings on CT scan probably related to pancreatitis and not PUD. Still may need outpatient EGD at some point.  -still having some degree of difficulty tolerate clear liquids. Has full liquid diet ordered but only taking clears. For now will leave on clear liquids.  -Will consult Surgery for consideration of cholecystectomy  2. Colon cancer screening. Will discuss at follow up appt.  OBJECTIVE:     Vital signs in last 24 hours: Temp:  [98.6 F (37 C)-99.8 F (37.7 C)] 98.6 F (37 C) (03/03 0533) Pulse Rate:  [73-85] 76 (03/03 0533) Resp:  [17-20] 18 (03/03 0533) BP: (164-188)/(80-88) 169/80 (03/03 0533) SpO2:  [95 %-97 %] 96 % (03/03 0533) Last BM Date: 05/01/17 General:   Alert, well-developed, white male in NAD EENT:  Normal hearing, non icteric sclera, conjunctive pink.  Heart:  Regular rate and rhythm.  No lower extremity edema Pulm: Normal respiratory effort, lungs CTA bilaterally without wheezes or crackles. Abdomen:  Soft, nondistended, nontender.  Normal bowel sounds, no masses felt. No hepatomegaly.    Neurologic:  Alert and  oriented x4;  grossly normal neurologically. Psych:  Pleasant, cooperative.  Normal mood and affect.   Intake/Output from previous day: 03/02 0701 - 03/03 0700 In: 3680 [P.O.:680; I.V.:3000] Out: -  Intake/Output this shift: No intake/output data  recorded.  Lab Results: Recent Labs    05/02/17 1114 05/03/17 0539 05/05/17 0543  WBC 11.7* 14.9* 13.9*  HGB 15.7 14.2 12.7*  HCT 45.9 42.3 38.6*  PLT 284 269 215   BMET Recent Labs    05/02/17 1114 05/03/17 0539 05/04/17 1006  NA 136 141 140  K 4.2 3.7 3.5  CL 100* 106 108  CO2 23 26 24   GLUCOSE 252* 144* 147*  BUN 15 14 16   CREATININE 0.92 0.77 0.78  CALCIUM 9.2 8.6* 8.2*   LFT Recent Labs    05/04/17 1006  PROT 5.9*  ALBUMIN 3.2*  AST 71*  ALT 92*  ALKPHOS 59  BILITOT 1.9*    US Abdomen Complete  Result Date: 05/03/2017 CLINICAL DATA:  72 year old male with history of pancreatitis. EXAM: ABDOMEN ULTRASOUND COMPLETE COMPARISON:  No prior abdominal ultrasound. CT the abdomen and pelvis 05/02/2017. FINDINGS: Gallbladder: No gallstones or wall thickening visualized. No sonographic Murphy sign noted by sonographer. Common bile duct: Diameter: 4.8 mm Liver: No focal lesion identified. Within normal limits in parenchymal echogenicity. Portal vein is patent on color Doppler imaging with normal direction of blood flow towards the liver. IVC: No abnormality visualized. Pancreas: Poorly visualized. Spleen: Size and appearance within normal limits. Right Kidney: Length: 12.1 cm. Echogenicity within normal limits. No mass or hydronephrosis visualized. Left Kidney: Length: 12.2 cm. Echogenicity within normal limits. No mass or hydronephrosis visualized. Abdominal aorta: Fusiform dilatation of the infrarenal abdominal aorta measuring up to 3.1 cm in diameter. Other findings: None. IMPRESSION: 1. Pancreas is poorly visualized on today's examination secondary to overlying bowel gas. 2. No acute findings. 3.  Small infrarenal abdominal aortic aneurysm measuring 3.1 cm in diameter. Recommend followup by ultrasound in 3 years. This recommendation follows ACR consensus guidelines: White Paper of the ACR Incidental Findings Committee II on Vascular Findings. J Am Coll Radiol 2013; 10:789-794.  Electronically Signed   By: Trudie Reed M.D.   On: 05/03/2017 15:38   Mr Abdomen Mrcp Wo Contrast  Result Date: 05/04/2017 CLINICAL DATA:  72 year old male with history of abnormal liver function tests. EXAM: MRI ABDOMEN WITHOUT CONTRAST  (INCLUDING MRCP) TECHNIQUE: Multiplanar multisequence MR imaging of the abdomen was performed. Heavily T2-weighted images of the biliary and pancreatic ducts were obtained, and three-dimensional MRCP images were rendered by post processing. COMPARISON:  No prior abdominal MRI. CT the abdomen and pelvis 05/02/2017. FINDINGS: Lower chest: Moderate right pleural effusion lying dependently. Increased signal intensity in portions of the right lower lobe dependently. Hepatobiliary: Diffuse loss of signal intensity throughout the hepatic parenchyma, indicative of hepatic steatosis. No suspicious cystic or solid hepatic lesions noted on today's noncontrast examination. No intra or extrahepatic biliary ductal dilatation on MRCP images. Common bile duct measures 6 mm in the porta hepatis. No filling defects along the common bile duct to suggest choledocholithiasis. Several tiny filling defects within the gallbladder, compatible with small gallstones. No surrounding inflammatory changes to suggest an acute cholecystitis at this time. Pancreas: No definite pancreatic mass appreciated on today's noncontrast examination. Increased T2 signal intensity adjacent to the head and proximal body of the pancreas, concerning for an acute pancreatitis. No pancreatic ductal dilatation noted on MRCP images. No well-defined peripancreatic fluid collections are noted. Spleen:  Unremarkable. Adrenals/Urinary Tract: Unenhanced appearance of the kidneys and bilateral adrenal glands is unremarkable. Stomach/Bowel: No hydroureteronephrosis in the visualized portions of the abdomen. Vascular/Lymphatic: Fusiform aneurysmal dilatation of the infrarenal abdominal aorta measuring 2.9 x 3.5 cm. No  lymphadenopathy noted in the abdomen. Other: Increased fluid signal intensity throughout the right side of the retroperitoneum, presumably related to acute pancreatitis. Trace volume of ascites in the right side of the abdomen. Musculoskeletal: No aggressive appearing osseous lesions are noted in the visualized portions of the skeleton. IMPRESSION: 1. No signs of biliary tract obstruction. 2. Cholelithiasis without evidence of acute cholecystitis at this time. 3. Inflammatory changes adjacent to the head and proximal body of the pancreas concerning for an acute pancreatitis. 4. Moderate right pleural effusion lying dependently with some associated passive subsegmental atelectasis in the right lower lobe. 5. Hepatic steatosis. Electronically Signed   By: Trudie Reed M.D.   On: 05/04/2017 12:19   Mr 3d Recon At Scanner  Result Date: 05/04/2017 CLINICAL DATA:  72 year old male with history of abnormal liver function tests. EXAM: MRI ABDOMEN WITHOUT CONTRAST  (INCLUDING MRCP) TECHNIQUE: Multiplanar multisequence MR imaging of the abdomen was performed. Heavily T2-weighted images of the biliary and pancreatic ducts were obtained, and three-dimensional MRCP images were rendered by post processing. COMPARISON:  No prior abdominal MRI. CT the abdomen and pelvis 05/02/2017. FINDINGS: Lower chest: Moderate right pleural effusion lying dependently. Increased signal intensity in portions of the right lower lobe dependently. Hepatobiliary: Diffuse loss of signal intensity throughout the hepatic parenchyma, indicative of hepatic steatosis. No suspicious cystic or solid hepatic lesions noted on today's noncontrast examination. No intra or extrahepatic biliary ductal dilatation on MRCP images. Common bile duct measures 6 mm in the porta hepatis. No filling defects along the common bile duct to suggest choledocholithiasis. Several tiny filling defects within the gallbladder, compatible with small gallstones. No surrounding  inflammatory changes  to suggest an acute cholecystitis at this time. Pancreas: No definite pancreatic mass appreciated on today's noncontrast examination. Increased T2 signal intensity adjacent to the head and proximal body of the pancreas, concerning for an acute pancreatitis. No pancreatic ductal dilatation noted on MRCP images. No well-defined peripancreatic fluid collections are noted. Spleen:  Unremarkable. Adrenals/Urinary Tract: Unenhanced appearance of the kidneys and bilateral adrenal glands is unremarkable. Stomach/Bowel: No hydroureteronephrosis in the visualized portions of the abdomen. Vascular/Lymphatic: Fusiform aneurysmal dilatation of the infrarenal abdominal aorta measuring 2.9 x 3.5 cm. No lymphadenopathy noted in the abdomen. Other: Increased fluid signal intensity throughout the right side of the retroperitoneum, presumably related to acute pancreatitis. Trace volume of ascites in the right side of the abdomen. Musculoskeletal: No aggressive appearing osseous lesions are noted in the visualized portions of the skeleton. IMPRESSION: 1. No signs of biliary tract obstruction. 2. Cholelithiasis without evidence of acute cholecystitis at this time. 3. Inflammatory changes adjacent to the head and proximal body of the pancreas concerning for an acute pancreatitis. 4. Moderate right pleural effusion lying dependently with some associated passive subsegmental atelectasis in the right lower lobe. 5. Hepatic steatosis. Electronically Signed   By: Trudie Reedaniel  Entrikin M.D.   On: 05/04/2017 12:19     Principal Problem:   Pancreatitis Active Problems:   Leukocytosis   Hypertensive urgency   Abdominal aortic aneurysm (AAA) (HCC)   Hyperglycemia     LOS: 3 days   Willette ClusterPaula Guenther ,NP 05/05/2017, 8:57 AM  Pager number [phone]    Attending physician's note   I have taken an interval history, reviewed the chart and examined the patient. I agree with the Advanced Practitioner's note, impression  and recommendations.  Acute pancreatitis likely biliary pancreatitis, has multiple small gallstones. No cbd stone or sludge and no dilation Surgery consulted for possible cholecystectomy to prevent recurrent episode Will follow up as outpatient and discuss EGD (CT read of posterior wall thickening likely was an over read and is related to pancreatitis) and also overdue for colorectal cancer screening.  Please call with any questions  K Scherry RanVeena Nandigam, MD [phone] Mon-Fri 8a-5p [phone] after 5p, weekends, holidays

## 2017-05-05 NOTE — Consult Note (Signed)
[facility]  [address] [place], [place] [address] Phone: [phone] FAX: [phone]     Imari Sivertsen  [birth year] [id number]  CARE TEAM:  PCP: Patient, No Pcp Per  Outpatient Care Team: Patient Care Team: Patient, No Pcp Per as PCP - General (General Practice)  Inpatient Treatment Team: Treatment Team: Attending Provider: Hosie Poisson, MD; Registered Nurse: Mliss Sax, RN; Rounding Team: Fatima Blank, MD; Registered Nurse: Virginia Rochester, RN; Consulting Physician: Mauri Pole, MD; Consulting Physician: Edison Pace, Md, MD   This patient is a 72 y.o.male who presents today for surgical evaluation at the request of Tye Savoy, PA, [facility].   Chief complaint / Reason for evaluation: Pancreatitis presumed to be due to gallstones.  72 year old active male who had sharp epigastric pain with severe nausea and vomiting.  Persisted.  Was concerned.  Loose bowel movements.  Happened a few hours after dinner.  Had chicken with some vegetables.  Not particularly greasy.  Usually from [place].  Relocated to [facility].  Has avoided medications and doctors for the past 2 years.  Was evaluated emergency room.  Abdominal pain with elevated lipase suspicious for pancreatitis.  Ultrasound with no obvious stones.  Admitted and hydrated.  Gastrology consulted.  Because of mildly increased liver function test MRCP ordered.  Confirmed gallstones not seen on ultrasound.  No choledocholithiasis.  Gastroenterology requested surgical consultation given his absence of alcohol or other etiologies of pancreatitis and known gallstones.  No personal nor family history of GI/colon cancer, inflammatory bowel disease, irritable bowel syndrome, allergy such as Celiac Sprue, dietary/dairy problems, colitis, ulcers nor gastritis.  No recent sick contacts/gastroenteritis.  No travel outside the country.  No changes in diet.  No dysphagia to solids or  liquids.  No significant heartburn or reflux.  No hematochezia, hematemesis, coffee ground emesis.  No evidence of prior gastric/peptic ulceration.  He recalls having a colonoscopy many years ago with one small polyp removed.  Does not know if it was adenomatous or hyperplastic.  Recalls being told he needed no further colonoscopy since he was in his 80s.  He had 2 glasses of wine around Christmas time but has not drunk since.  Rarely drinks alcohol.  No change in medications.  Not on diuretics.  No antibiotics.   He has not had abdominal surgery before to his knowledge   Assessment  Brayton Caves  72 y.o. male       Problem List:  Principal Problem:   Gallstone pancreatitis Active Problems:   Leukocytosis   Hypertensive urgency   Abdominal aortic aneurysm (AAA) (HCC)   Hyperglycemia   Chronic cholecystitis with calculus   Epigastric pain nausea vomiting and elevated lipase consistent with gallstone pancreatitis.  Rest of the differential diagnosis seems like an unlikely cause for pancreatitis  Plan:  I think he would benefit from cholecystectomy this admission.  His pain is down.  His liver function tests have stabilized.  His lipase has resolved.  Reasonable laparoscopic approach.  Will add on for tomorrow since we have many cases already today.  He had many questions.  I spent extra time going over them in detail to his satisfaction for now.  We will have the operating surgeon meet up with him again tomorrow prior to surgery to confirm he is okay with proceeding.  The anatomy & physiology of hepatobiliary & pancreatic function was discussed.  The pathophysiology of gallbladder dysfunction was discussed.  Natural history  risks without surgery was discussed.   I feel the risks of no intervention will lead to serious problems that outweigh the operative risks; therefore, I recommended cholecystectomy to remove the pathology.  I explained laparoscopic techniques with possible need for an open  approach.  Probable cholangiogram to evaluate the bilary tract was explained as well.    Risks such as bleeding, infection, abscess, leak, injury to other organs, need for repair of tissues / organs, need for further treatment, stroke, heart attack, death, and other risks were discussed.  I noted a good likelihood this will help address the problem.  Possibility that this will not correct all abdominal symptoms was explained.  Goals of post-operative recovery were discussed as well.  We will work to minimize complications.  An educational handout further explaining the pathology and treatment options was given as well.  Questions were answered.  The patient expresses understanding & wishes to proceed with surgery.    -VTE prophylaxis- SCDs, etc -mobilize as tolerated to help recovery  35 minutes spent in review, evaluation, examination, counseling, and coordination of care.  More than 50% of that time was spent in counseling.  Adin Hector, M.D., F.A.C.S. Gastrointestinal and Minimally Invasive Surgery [facility] [address], [place], [place] [address] [phone] Main / Paging   05/05/2017      Past Medical History:  Diagnosis Date  . HLD (hyperlipidemia)   . HTN (hypertension)   . Prediabetes     History reviewed. No pertinent surgical history.  Social History   Socioeconomic History  . Marital status: Single    Spouse name: Not on file  . Number of children: Not on file  . Years of education: Not on file  . Highest education level: Not on file  Social Needs  . Financial resource strain: Not on file  . Food insecurity - worry: Not on file  . Food insecurity - inability: Not on file  . Transportation needs - medical: Not on file  . Transportation needs - non-medical: Not on file  Occupational History  . Not on file  Tobacco Use  . Smoking status: Former Research scientist (life sciences)  . Smokeless tobacco: Never Used  Substance and Sexual Activity   . Alcohol use: Yes    Frequency: Never  . Drug use: No  . Sexual activity: Not on file  Other Topics Concern  . Not on file  Social History Narrative  . Not on file    Family History  Problem Relation Age of Onset  . Breast cancer Mother   . Hypertension Father   . Hypertension Sister     Current Facility-Administered Medications  Medication Dose Route Frequency Provider Last Rate Last Dose  . 0.9 %  sodium chloride infusion   Intravenous Continuous Orlie Dakin, MD 125 mL/hr at 05/04/17 2152    . amLODipine (NORVASC) tablet 5 mg  5 mg Oral Daily Hosie Poisson, MD   5 mg at 05/04/17 1655  . enoxaparin (LOVENOX) injection 40 mg  40 mg Subcutaneous Q24H Fuller Plan A, MD   40 mg at 05/04/17 2010  . hydrALAZINE (APRESOLINE) injection 10 mg  10 mg Intravenous Q4H PRN Fuller Plan A, MD   10 mg at 05/04/17 1519  . insulin aspart (novoLOG) injection 0-7 Units  0-7 Units Subcutaneous Q6H Fuller Plan A, MD   3 Units at 05/04/17 1818  . ipratropium-albuterol (DUONEB) 0.5-2.5 (3) MG/3ML nebulizer solution 3 mL  3 mL Nebulization Q4H PRN Tamala Julian,  Rondell A, MD      . morphine 4 MG/ML injection 3 mg  3 mg Intravenous Q3H PRN Bodenheimer, Charles A, NP   3 mg at 05/05/17 0650  . ondansetron (ZOFRAN) injection 4 mg  4 mg Intravenous Q6H PRN Bodenheimer, Charles A, NP   4 mg at 05/04/17 1519  . pantoprazole (PROTONIX) injection 40 mg  40 mg Intravenous Q24H Hosie Poisson, MD   40 mg at 05/04/17 0941  . simethicone (MYLICON) chewable tablet 80 mg  80 mg Oral Q6H PRN Hosie Poisson, MD   80 mg at 05/04/17 2010     Allergies  Allergen Reactions  . Sulfa Antibiotics     ROS:   All other systems reviewed & are negative except per HPI or as noted below: Constitutional:  No fevers, chills, sweats.  Weight stable Eyes:  No vision changes, No discharge HENT:  No sore throats, nasal drainage Lymph: No neck swelling, No bruising easily Pulmonary:  No cough, productive sputum CV: No  orthopnea, PND  Patient walks 60 minutes for about 2 miles without difficulty.  No exertional chest/neck/shoulder/arm pain. GI:  No personal nor family history of GI/colon cancer, inflammatory bowel disease, irritable bowel syndrome, allergy such as Celiac Sprue, dietary/dairy problems, colitis, ulcers nor gastritis.  No recent sick contacts/gastroenteritis.  No travel outside the country.  No changes in diet. Renal: No UTIs, No hematuria Genital:  No drainage, bleeding, masses Musculoskeletal: No severe joint pain.  Good ROM major joints Skin:  No sores or lesions.  No rashes Heme/Lymph:  No easy bleeding.  No swollen lymph nodes Neuro: No focal weakness/numbness.  No seizures Psych: No suicidal ideation.  No hallucinations  BP (!) 169/80 (BP Location: Left Arm)   Pulse 76   Temp 98.6 F (37 C) (Oral)   Resp 18   Ht 6' (1.829 m)   Wt 110.7 kg (244 lb)   SpO2 96%   BMI 33.09 kg/m   Physical Exam: General: Pt awake/alert/oriented x4 in no major acute distress Eyes: PERRL, normal EOM. Sclera nonicteric Neuro: CN II-XII intact w/o focal sensory/motor deficits. Lymph: No head/neck/groin lymphadenopathy Psych:  No delerium/psychosis/paranoia HENT: Normocephalic, Mucus membranes moist.  No thrush Neck: Supple, No tracheal deviation Chest: No pain.  Good respiratory excursion. CV:  Pulses intact.  Regular rhythm Abdomen: Soft, Nondistended.  Obese.  Mild epigastric discomfort but no Murphy sign.  No peritonitis.  No guarding.  No rebound tenderness.  No incarcerated hernias. Gen:  No inguinal hernias.  No inguinal lymphadenopathy.   Ext:  SCDs BLE.  No significant edema.  No cyanosis Skin: No petechiae / purpurea.  No major sores Musculoskeletal: No severe joint pain.  Good ROM major joints   Results:   Labs: Results for orders placed or performed during the hospital encounter of 05/02/17 (from the past 48 hour(s))  Glucose, capillary     Status: Abnormal   Collection Time:  05/03/17 12:10 PM  Result Value Ref Range   Glucose-Capillary 154 (H) 65 - 99 mg/dL  Glucose, capillary     Status: Abnormal   Collection Time: 05/03/17  6:06 PM  Result Value Ref Range   Glucose-Capillary 141 (H) 65 - 99 mg/dL  Glucose, capillary     Status: Abnormal   Collection Time: 05/04/17 12:11 AM  Result Value Ref Range   Glucose-Capillary 127 (H) 65 - 99 mg/dL  Glucose, capillary     Status: Abnormal   Collection Time: 05/04/17  6:17 AM  Result Value  Ref Range   Glucose-Capillary 139 (H) 65 - 99 mg/dL  Comprehensive metabolic panel Once     Status: Abnormal   Collection Time: 05/04/17 10:06 AM  Result Value Ref Range   Sodium 140 135 - 145 mmol/L   Potassium 3.5 3.5 - 5.1 mmol/L   Chloride 108 101 - 111 mmol/L   CO2 24 22 - 32 mmol/L   Glucose, Bld 147 (H) 65 - 99 mg/dL   BUN 16 6 - 20 mg/dL   Creatinine, Ser 0.78 0.61 - 1.24 mg/dL   Calcium 8.2 (L) 8.9 - 10.3 mg/dL   Total Protein 5.9 (L) 6.5 - 8.1 g/dL   Albumin 3.2 (L) 3.5 - 5.0 g/dL   AST 71 (H) 15 - 41 U/L   ALT 92 (H) 17 - 63 U/L   Alkaline Phosphatase 59 38 - 126 U/L   Total Bilirubin 1.9 (H) 0.3 - 1.2 mg/dL   GFR calc non Af Amer >60 >60 mL/min   GFR calc Af Amer >60 >60 mL/min    Comment: (NOTE) The eGFR has been calculated using the CKD EPI equation. This calculation has not been validated in all clinical situations. eGFR's persistently <60 mL/min signify possible Chronic Kidney Disease.    Anion gap 8 5 - 15    Comment: Performed at [facility], [place] [address], [place], [place] [address]  Glucose, capillary     Status: Abnormal   Collection Time: 05/04/17 12:19 PM  Result Value Ref Range   Glucose-Capillary 143 (H) 65 - 99 mg/dL   Comment 1 Notify RN   Glucose, capillary     Status: Abnormal   Collection Time: 05/04/17  6:09 PM  Result Value Ref Range   Glucose-Capillary 218 (H) 65 - 99 mg/dL   Comment 1 Notify RN   Glucose, capillary     Status: Abnormal   Collection Time:  05/05/17 12:07 AM  Result Value Ref Range   Glucose-Capillary 148 (H) 65 - 99 mg/dL  Glucose, capillary     Status: Abnormal   Collection Time: 05/05/17  5:31 AM  Result Value Ref Range   Glucose-Capillary 141 (H) 65 - 99 mg/dL  CBC     Status: Abnormal   Collection Time: 05/05/17  5:43 AM  Result Value Ref Range   WBC 13.9 (H) 4.0 - 10.5 K/uL   RBC 4.36 4.22 - 5.81 MIL/uL   Hemoglobin 12.7 (L) 13.0 - 17.0 g/dL   HCT 38.6 (L) 39.0 - 52.0 %   MCV 88.5 78.0 - 100.0 fL   MCH 29.1 26.0 - 34.0 pg   MCHC 32.9 30.0 - 36.0 g/dL   RDW 13.8 11.5 - 15.5 %   Platelets 215 150 - 400 K/uL    Comment: Performed at [facility], [place] [address], [place], [place] [address]    Imaging / Studies: US Abdomen Complete  Result Date: 05/03/2017 CLINICAL DATA:  72 year old male with history of pancreatitis. EXAM: ABDOMEN ULTRASOUND COMPLETE COMPARISON:  No prior abdominal ultrasound. CT the abdomen and pelvis 05/02/2017. FINDINGS: Gallbladder: No gallstones or wall thickening visualized. No sonographic Murphy sign noted by sonographer. Common bile duct: Diameter: 4.8 mm Liver: No focal lesion identified. Within normal limits in parenchymal echogenicity. Portal vein is patent on color Doppler imaging with normal direction of blood flow towards the liver. IVC: No abnormality visualized. Pancreas: Poorly visualized. Spleen: Size and appearance within normal limits. Right Kidney: Length: 12.1 cm. Echogenicity within normal limits. No mass or  hydronephrosis visualized. Left Kidney: Length: 12.2 cm. Echogenicity within normal limits. No mass or hydronephrosis visualized. Abdominal aorta: Fusiform dilatation of the infrarenal abdominal aorta measuring up to 3.1 cm in diameter. Other findings: None. IMPRESSION: 1. Pancreas is poorly visualized on today's examination secondary to overlying bowel gas. 2. No acute findings. 3. Small infrarenal abdominal aortic aneurysm measuring 3.1 cm in diameter. Recommend  followup by ultrasound in 3 years. This recommendation follows ACR consensus guidelines: White Paper of the ACR Incidental Findings Committee II on Vascular Findings. J Am Coll Radiol 2013; 10:789-794. Electronically Signed   By: Vinnie Langton M.D.   On: 05/03/2017 15:38   Ct Abdomen Pelvis W Contrast  Result Date: 05/02/2017 CLINICAL DATA:  Acute generalized abdominal pain. EXAM: CT ABDOMEN AND PELVIS WITH CONTRAST TECHNIQUE: Multidetector CT imaging of the abdomen and pelvis was performed using the standard protocol following bolus administration of intravenous contrast. CONTRAST:  130m ISOVUE-300 IOPAMIDOL (ISOVUE-300) INJECTION 61% COMPARISON:  None. FINDINGS: Lower chest: No acute abnormality. Hepatobiliary: No focal liver abnormality is seen. No gallstones, gallbladder wall thickening, or biliary dilatation. Pancreas: Unremarkable. No pancreatic ductal dilatation or surrounding inflammatory changes. Spleen: Normal in size without focal abnormality. Adrenals/Urinary Tract: Adrenal glands are unremarkable. Kidneys are normal, without renal calculi, focal lesion, or hydronephrosis. Bladder is unremarkable. Stomach/Bowel: The appendix appears normal. There is no evidence of bowel obstruction. Focal wall thickening and inflammatory changes are seen involving the posterior wall of the gastric body, which may represent peptic ulcer disease. Small amount of fluid is also seen posterior to second portion of duodenum. Vascular/Lymphatic: 3.3 cm infrarenal abdominal aortic aneurysm is noted. Atherosclerosis of abdominal aorta is noted. No adenopathy is noted. Reproductive: Prostate is unremarkable. Other: Small fat containing periumbilical hernia is noted. Musculoskeletal: No acute or significant osseous findings. IMPRESSION: Focal wall thickening and other inflammatory changes are seen along the posterior wall of gastric body concerning for possible peptic ulcer disease. Small amount of fluid is seen posterior  to the second portion of duodenum which may be related to peptic ulcer disease is well. 3.3 cm infrarenal abdominal aortic aneurysm. Recommend followup by ultrasound in 3 years. This recommendation follows ACR consensus guidelines: White Paper of the ACR Incidental Findings Committee II on Vascular Findings. J Am Coll Radiol 2013; 10:789-794. Aortic Atherosclerosis (ICD10-I70.0). Electronically Signed   By: JMarijo Conception M.D.   On: 05/02/2017 13:20   Mr Abdomen Mrcp Wo Contrast  Result Date: 05/04/2017 CLINICAL DATA:  72year old male with history of abnormal liver function tests. EXAM: MRI ABDOMEN WITHOUT CONTRAST  (INCLUDING MRCP) TECHNIQUE: Multiplanar multisequence MR imaging of the abdomen was performed. Heavily T2-weighted images of the biliary and pancreatic ducts were obtained, and three-dimensional MRCP images were rendered by post processing. COMPARISON:  No prior abdominal MRI. CT the abdomen and pelvis 05/02/2017. FINDINGS: Lower chest: Moderate right pleural effusion lying dependently. Increased signal intensity in portions of the right lower lobe dependently. Hepatobiliary: Diffuse loss of signal intensity throughout the hepatic parenchyma, indicative of hepatic steatosis. No suspicious cystic or solid hepatic lesions noted on today's noncontrast examination. No intra or extrahepatic biliary ductal dilatation on MRCP images. Common bile duct measures 6 mm in the porta hepatis. No filling defects along the common bile duct to suggest choledocholithiasis. Several tiny filling defects within the gallbladder, compatible with small gallstones. No surrounding inflammatory changes to suggest an acute cholecystitis at this time. Pancreas: No definite pancreatic mass appreciated on today's noncontrast examination. Increased T2 signal intensity adjacent to  the head and proximal body of the pancreas, concerning for an acute pancreatitis. No pancreatic ductal dilatation noted on MRCP images. No well-defined  peripancreatic fluid collections are noted. Spleen:  Unremarkable. Adrenals/Urinary Tract: Unenhanced appearance of the kidneys and bilateral adrenal glands is unremarkable. Stomach/Bowel: No hydroureteronephrosis in the visualized portions of the abdomen. Vascular/Lymphatic: Fusiform aneurysmal dilatation of the infrarenal abdominal aorta measuring 2.9 x 3.5 cm. No lymphadenopathy noted in the abdomen. Other: Increased fluid signal intensity throughout the right side of the retroperitoneum, presumably related to acute pancreatitis. Trace volume of ascites in the right side of the abdomen. Musculoskeletal: No aggressive appearing osseous lesions are noted in the visualized portions of the skeleton. IMPRESSION: 1. No signs of biliary tract obstruction. 2. Cholelithiasis without evidence of acute cholecystitis at this time. 3. Inflammatory changes adjacent to the head and proximal body of the pancreas concerning for an acute pancreatitis. 4. Moderate right pleural effusion lying dependently with some associated passive subsegmental atelectasis in the right lower lobe. 5. Hepatic steatosis. Electronically Signed   By: Vinnie Langton M.D.   On: 05/04/2017 12:19   Mr 3d Recon At Scanner  Result Date: 05/04/2017 CLINICAL DATA:  72 year old male with history of abnormal liver function tests. EXAM: MRI ABDOMEN WITHOUT CONTRAST  (INCLUDING MRCP) TECHNIQUE: Multiplanar multisequence MR imaging of the abdomen was performed. Heavily T2-weighted images of the biliary and pancreatic ducts were obtained, and three-dimensional MRCP images were rendered by post processing. COMPARISON:  No prior abdominal MRI. CT the abdomen and pelvis 05/02/2017. FINDINGS: Lower chest: Moderate right pleural effusion lying dependently. Increased signal intensity in portions of the right lower lobe dependently. Hepatobiliary: Diffuse loss of signal intensity throughout the hepatic parenchyma, indicative of hepatic steatosis. No suspicious cystic  or solid hepatic lesions noted on today's noncontrast examination. No intra or extrahepatic biliary ductal dilatation on MRCP images. Common bile duct measures 6 mm in the porta hepatis. No filling defects along the common bile duct to suggest choledocholithiasis. Several tiny filling defects within the gallbladder, compatible with small gallstones. No surrounding inflammatory changes to suggest an acute cholecystitis at this time. Pancreas: No definite pancreatic mass appreciated on today's noncontrast examination. Increased T2 signal intensity adjacent to the head and proximal body of the pancreas, concerning for an acute pancreatitis. No pancreatic ductal dilatation noted on MRCP images. No well-defined peripancreatic fluid collections are noted. Spleen:  Unremarkable. Adrenals/Urinary Tract: Unenhanced appearance of the kidneys and bilateral adrenal glands is unremarkable. Stomach/Bowel: No hydroureteronephrosis in the visualized portions of the abdomen. Vascular/Lymphatic: Fusiform aneurysmal dilatation of the infrarenal abdominal aorta measuring 2.9 x 3.5 cm. No lymphadenopathy noted in the abdomen. Other: Increased fluid signal intensity throughout the right side of the retroperitoneum, presumably related to acute pancreatitis. Trace volume of ascites in the right side of the abdomen. Musculoskeletal: No aggressive appearing osseous lesions are noted in the visualized portions of the skeleton. IMPRESSION: 1. No signs of biliary tract obstruction. 2. Cholelithiasis without evidence of acute cholecystitis at this time. 3. Inflammatory changes adjacent to the head and proximal body of the pancreas concerning for an acute pancreatitis. 4. Moderate right pleural effusion lying dependently with some associated passive subsegmental atelectasis in the right lower lobe. 5. Hepatic steatosis. Electronically Signed   By: Vinnie Langton M.D.   On: 05/04/2017 12:19    Medications / Allergies: per  chart  Antibiotics: Anti-infectives (From admission, onward)   None        Note: Portions of this report may have been  transcribed using voice recognition software. Every effort was made to ensure accuracy; however, inadvertent computerized transcription errors may be present.   Any transcriptional errors that result from this process are unintentional.    Adin Hector, M.D., F.A.C.S. Gastrointestinal and Minimally Invasive Surgery [facility] [address], [place], [place] [address] [phone] Main / Paging   05/05/2017

## 2017-05-05 NOTE — H&P (View-Only) (Signed)
[facility]  [address] [place], [place] [address] Phone: [phone] FAX: [phone]     Nathan Baxter  [birth year] [id number]  CARE TEAM:  PCP: Patient, No Pcp Per  Outpatient Care Team: Patient Care Team: Patient, No Pcp Per as PCP - General (General Practice)  Inpatient Treatment Team: Treatment Team: Attending Provider: Hosie Poisson, MD; Registered Nurse: Mliss Sax, RN; Rounding Team: Fatima Blank, MD; Registered Nurse: Virginia Rochester, RN; Consulting Physician: Mauri Pole, MD; Consulting Physician: Edison Pace, Md, MD   This patient is a 72 y.o.male who presents today for surgical evaluation at the request of Tye Savoy, PA, [facility].   Chief complaint / Reason for evaluation: Pancreatitis presumed to be due to gallstones.  72 year old active male who had sharp epigastric pain with severe nausea and vomiting.  Persisted.  Was concerned.  Loose bowel movements.  Happened a few hours after dinner.  Had chicken with some vegetables.  Not particularly greasy.  Usually from [place].  Relocated to [facility].  Has avoided medications and doctors for the past 2 years.  Was evaluated emergency room.  Abdominal pain with elevated lipase suspicious for pancreatitis.  Ultrasound with no obvious stones.  Admitted and hydrated.  Gastrology consulted.  Because of mildly increased liver function test MRCP ordered.  Confirmed gallstones not seen on ultrasound.  No choledocholithiasis.  Gastroenterology requested surgical consultation given his absence of alcohol or other etiologies of pancreatitis and known gallstones.  No personal nor family history of GI/colon cancer, inflammatory bowel disease, irritable bowel syndrome, allergy such as Celiac Sprue, dietary/dairy problems, colitis, ulcers nor gastritis.  No recent sick contacts/gastroenteritis.  No travel outside the country.  No changes in diet.  No dysphagia to solids or  liquids.  No significant heartburn or reflux.  No hematochezia, hematemesis, coffee ground emesis.  No evidence of prior gastric/peptic ulceration.  He recalls having a colonoscopy many years ago with one small polyp removed.  Does not know if it was adenomatous or hyperplastic.  Recalls being told he needed no further colonoscopy since he was in his 80s.  He had 2 glasses of wine around Christmas time but has not drunk since.  Rarely drinks alcohol.  No change in medications.  Not on diuretics.  No antibiotics.   He has not had abdominal surgery before to his knowledge   Assessment  Nathan Baxter  72 y.o. male       Problem List:  Principal Problem:   Gallstone pancreatitis Active Problems:   Leukocytosis   Hypertensive urgency   Abdominal aortic aneurysm (AAA) (HCC)   Hyperglycemia   Chronic cholecystitis with calculus   Epigastric pain nausea vomiting and elevated lipase consistent with gallstone pancreatitis.  Rest of the differential diagnosis seems like an unlikely cause for pancreatitis  Plan:  I think he would benefit from cholecystectomy this admission.  His pain is down.  His liver function tests have stabilized.  His lipase has resolved.  Reasonable laparoscopic approach.  Will add on for tomorrow since we have many cases already today.  He had many questions.  I spent extra time going over them in detail to his satisfaction for now.  We will have the operating surgeon meet up with him again tomorrow prior to surgery to confirm he is okay with proceeding.  The anatomy & physiology of hepatobiliary & pancreatic function was discussed.  The pathophysiology of gallbladder dysfunction was discussed.  Natural history  risks without surgery was discussed.   I feel the risks of no intervention will lead to serious problems that outweigh the operative risks; therefore, I recommended cholecystectomy to remove the pathology.  I explained laparoscopic techniques with possible need for an open  approach.  Probable cholangiogram to evaluate the bilary tract was explained as well.    Risks such as bleeding, infection, abscess, leak, injury to other organs, need for repair of tissues / organs, need for further treatment, stroke, heart attack, death, and other risks were discussed.  I noted a good likelihood this will help address the problem.  Possibility that this will not correct all abdominal symptoms was explained.  Goals of post-operative recovery were discussed as well.  We will work to minimize complications.  An educational handout further explaining the pathology and treatment options was given as well.  Questions were answered.  The patient expresses understanding & wishes to proceed with surgery.    -VTE prophylaxis- SCDs, etc -mobilize as tolerated to help recovery  35 minutes spent in review, evaluation, examination, counseling, and coordination of care.  More than 50% of that time was spent in counseling.  Nathan Baxter, M.D., F.A.C.S. Gastrointestinal and Minimally Invasive Surgery [facility] [address], [place], [place] [address] [phone] Main / Paging   05/05/2017      Past Medical History:  Diagnosis Date  . HLD (hyperlipidemia)   . HTN (hypertension)   . Prediabetes     History reviewed. No pertinent surgical history.  Social History   Socioeconomic History  . Marital status: Single    Spouse name: Not on file  . Number of children: Not on file  . Years of education: Not on file  . Highest education level: Not on file  Social Needs  . Financial resource strain: Not on file  . Food insecurity - worry: Not on file  . Food insecurity - inability: Not on file  . Transportation needs - medical: Not on file  . Transportation needs - non-medical: Not on file  Occupational History  . Not on file  Tobacco Use  . Smoking status: Former Research scientist (life sciences)  . Smokeless tobacco: Never Used  Substance and Sexual Activity   . Alcohol use: Yes    Frequency: Never  . Drug use: No  . Sexual activity: Not on file  Other Topics Concern  . Not on file  Social History Narrative  . Not on file    Family History  Problem Relation Age of Onset  . Breast cancer Mother   . Hypertension Father   . Hypertension Sister     Current Facility-Administered Medications  Medication Dose Route Frequency Provider Last Rate Last Dose  . 0.9 %  sodium chloride infusion   Intravenous Continuous Orlie Dakin, MD 125 mL/hr at 05/04/17 2152    . amLODipine (NORVASC) tablet 5 mg  5 mg Oral Daily Hosie Poisson, MD   5 mg at 05/04/17 1655  . enoxaparin (LOVENOX) injection 40 mg  40 mg Subcutaneous Q24H Fuller Plan A, MD   40 mg at 05/04/17 2010  . hydrALAZINE (APRESOLINE) injection 10 mg  10 mg Intravenous Q4H PRN Fuller Plan A, MD   10 mg at 05/04/17 1519  . insulin aspart (novoLOG) injection 0-7 Units  0-7 Units Subcutaneous Q6H Fuller Plan A, MD   3 Units at 05/04/17 1818  . ipratropium-albuterol (DUONEB) 0.5-2.5 (3) MG/3ML nebulizer solution 3 mL  3 mL Nebulization Q4H PRN Tamala Julian,  Rondell A, MD      . morphine 4 MG/ML injection 3 mg  3 mg Intravenous Q3H PRN Bodenheimer, Charles A, NP   3 mg at 05/05/17 0650  . ondansetron (ZOFRAN) injection 4 mg  4 mg Intravenous Q6H PRN Bodenheimer, Charles A, NP   4 mg at 05/04/17 1519  . pantoprazole (PROTONIX) injection 40 mg  40 mg Intravenous Q24H Hosie Poisson, MD   40 mg at 05/04/17 0941  . simethicone (MYLICON) chewable tablet 80 mg  80 mg Oral Q6H PRN Hosie Poisson, MD   80 mg at 05/04/17 2010     Allergies  Allergen Reactions  . Sulfa Antibiotics     ROS:   All other systems reviewed & are negative except per HPI or as noted below: Constitutional:  No fevers, chills, sweats.  Weight stable Eyes:  No vision changes, No discharge HENT:  No sore throats, nasal drainage Lymph: No neck swelling, No bruising easily Pulmonary:  No cough, productive sputum CV: No  orthopnea, PND  Patient walks 60 minutes for about 2 miles without difficulty.  No exertional chest/neck/shoulder/arm pain. GI:  No personal nor family history of GI/colon cancer, inflammatory bowel disease, irritable bowel syndrome, allergy such as Celiac Sprue, dietary/dairy problems, colitis, ulcers nor gastritis.  No recent sick contacts/gastroenteritis.  No travel outside the country.  No changes in diet. Renal: No UTIs, No hematuria Genital:  No drainage, bleeding, masses Musculoskeletal: No severe joint pain.  Good ROM major joints Skin:  No sores or lesions.  No rashes Heme/Lymph:  No easy bleeding.  No swollen lymph nodes Neuro: No focal weakness/numbness.  No seizures Psych: No suicidal ideation.  No hallucinations  BP (!) 169/80 (BP Location: Left Arm)   Pulse 76   Temp 98.6 F (37 C) (Oral)   Resp 18   Ht 6' (1.829 m)   Wt 110.7 kg (244 lb)   SpO2 96%   BMI 33.09 kg/m   Physical Exam: General: Pt awake/alert/oriented x4 in no major acute distress Eyes: PERRL, normal EOM. Sclera nonicteric Neuro: CN II-XII intact w/o focal sensory/motor deficits. Lymph: No head/neck/groin lymphadenopathy Psych:  No delerium/psychosis/paranoia HENT: Normocephalic, Mucus membranes moist.  No thrush Neck: Supple, No tracheal deviation Chest: No pain.  Good respiratory excursion. CV:  Pulses intact.  Regular rhythm Abdomen: Soft, Nondistended.  Obese.  Mild epigastric discomfort but no Murphy sign.  No peritonitis.  No guarding.  No rebound tenderness.  No incarcerated hernias. Gen:  No inguinal hernias.  No inguinal lymphadenopathy.   Ext:  SCDs BLE.  No significant edema.  No cyanosis Skin: No petechiae / purpurea.  No major sores Musculoskeletal: No severe joint pain.  Good ROM major joints   Results:   Labs: Results for orders placed or performed during the hospital encounter of 05/02/17 (from the past 48 hour(s))  Glucose, capillary     Status: Abnormal   Collection Time:  05/03/17 12:10 PM  Result Value Ref Range   Glucose-Capillary 154 (H) 65 - 99 mg/dL  Glucose, capillary     Status: Abnormal   Collection Time: 05/03/17  6:06 PM  Result Value Ref Range   Glucose-Capillary 141 (H) 65 - 99 mg/dL  Glucose, capillary     Status: Abnormal   Collection Time: 05/04/17 12:11 AM  Result Value Ref Range   Glucose-Capillary 127 (H) 65 - 99 mg/dL  Glucose, capillary     Status: Abnormal   Collection Time: 05/04/17  6:17 AM  Result Value  Ref Range   Glucose-Capillary 139 (H) 65 - 99 mg/dL  Comprehensive metabolic panel Once     Status: Abnormal   Collection Time: 05/04/17 10:06 AM  Result Value Ref Range   Sodium 140 135 - 145 mmol/L   Potassium 3.5 3.5 - 5.1 mmol/L   Chloride 108 101 - 111 mmol/L   CO2 24 22 - 32 mmol/L   Glucose, Bld 147 (H) 65 - 99 mg/dL   BUN 16 6 - 20 mg/dL   Creatinine, Ser 0.78 0.61 - 1.24 mg/dL   Calcium 8.2 (L) 8.9 - 10.3 mg/dL   Total Protein 5.9 (L) 6.5 - 8.1 g/dL   Albumin 3.2 (L) 3.5 - 5.0 g/dL   AST 71 (H) 15 - 41 U/L   ALT 92 (H) 17 - 63 U/L   Alkaline Phosphatase 59 38 - 126 U/L   Total Bilirubin 1.9 (H) 0.3 - 1.2 mg/dL   GFR calc non Af Amer >60 >60 mL/min   GFR calc Af Amer >60 >60 mL/min    Comment: (NOTE) The eGFR has been calculated using the CKD EPI equation. This calculation has not been validated in all clinical situations. eGFR's persistently <60 mL/min signify possible Chronic Kidney Disease.    Anion gap 8 5 - 15    Comment: Performed at [facility], [place] [address], [place], [place] [address]  Glucose, capillary     Status: Abnormal   Collection Time: 05/04/17 12:19 PM  Result Value Ref Range   Glucose-Capillary 143 (H) 65 - 99 mg/dL   Comment 1 Notify RN   Glucose, capillary     Status: Abnormal   Collection Time: 05/04/17  6:09 PM  Result Value Ref Range   Glucose-Capillary 218 (H) 65 - 99 mg/dL   Comment 1 Notify RN   Glucose, capillary     Status: Abnormal   Collection Time:  05/05/17 12:07 AM  Result Value Ref Range   Glucose-Capillary 148 (H) 65 - 99 mg/dL  Glucose, capillary     Status: Abnormal   Collection Time: 05/05/17  5:31 AM  Result Value Ref Range   Glucose-Capillary 141 (H) 65 - 99 mg/dL  CBC     Status: Abnormal   Collection Time: 05/05/17  5:43 AM  Result Value Ref Range   WBC 13.9 (H) 4.0 - 10.5 K/uL   RBC 4.36 4.22 - 5.81 MIL/uL   Hemoglobin 12.7 (L) 13.0 - 17.0 g/dL   HCT 38.6 (L) 39.0 - 52.0 %   MCV 88.5 78.0 - 100.0 fL   MCH 29.1 26.0 - 34.0 pg   MCHC 32.9 30.0 - 36.0 g/dL   RDW 13.8 11.5 - 15.5 %   Platelets 215 150 - 400 K/uL    Comment: Performed at [facility], [place] [address], [place], [place] [address]    Imaging / Studies: US Abdomen Complete  Result Date: 05/03/2017 CLINICAL DATA:  72 year old male with history of pancreatitis. EXAM: ABDOMEN ULTRASOUND COMPLETE COMPARISON:  No prior abdominal ultrasound. CT the abdomen and pelvis 05/02/2017. FINDINGS: Gallbladder: No gallstones or wall thickening visualized. No sonographic Murphy sign noted by sonographer. Common bile duct: Diameter: 4.8 mm Liver: No focal lesion identified. Within normal limits in parenchymal echogenicity. Portal vein is patent on color Doppler imaging with normal direction of blood flow towards the liver. IVC: No abnormality visualized. Pancreas: Poorly visualized. Spleen: Size and appearance within normal limits. Right Kidney: Length: 12.1 cm. Echogenicity within normal limits. No mass or  hydronephrosis visualized. Left Kidney: Length: 12.2 cm. Echogenicity within normal limits. No mass or hydronephrosis visualized. Abdominal aorta: Fusiform dilatation of the infrarenal abdominal aorta measuring up to 3.1 cm in diameter. Other findings: None. IMPRESSION: 1. Pancreas is poorly visualized on today's examination secondary to overlying bowel gas. 2. No acute findings. 3. Small infrarenal abdominal aortic aneurysm measuring 3.1 cm in diameter. Recommend  followup by ultrasound in 3 years. This recommendation follows ACR consensus guidelines: White Paper of the ACR Incidental Findings Committee II on Vascular Findings. J Am Coll Radiol 2013; 10:789-794. Electronically Signed   By: Vinnie Langton M.D.   On: 05/03/2017 15:38   Ct Abdomen Pelvis W Contrast  Result Date: 05/02/2017 CLINICAL DATA:  Acute generalized abdominal pain. EXAM: CT ABDOMEN AND PELVIS WITH CONTRAST TECHNIQUE: Multidetector CT imaging of the abdomen and pelvis was performed using the standard protocol following bolus administration of intravenous contrast. CONTRAST:  130m ISOVUE-300 IOPAMIDOL (ISOVUE-300) INJECTION 61% COMPARISON:  None. FINDINGS: Lower chest: No acute abnormality. Hepatobiliary: No focal liver abnormality is seen. No gallstones, gallbladder wall thickening, or biliary dilatation. Pancreas: Unremarkable. No pancreatic ductal dilatation or surrounding inflammatory changes. Spleen: Normal in size without focal abnormality. Adrenals/Urinary Tract: Adrenal glands are unremarkable. Kidneys are normal, without renal calculi, focal lesion, or hydronephrosis. Bladder is unremarkable. Stomach/Bowel: The appendix appears normal. There is no evidence of bowel obstruction. Focal wall thickening and inflammatory changes are seen involving the posterior wall of the gastric body, which may represent peptic ulcer disease. Small amount of fluid is also seen posterior to second portion of duodenum. Vascular/Lymphatic: 3.3 cm infrarenal abdominal aortic aneurysm is noted. Atherosclerosis of abdominal aorta is noted. No adenopathy is noted. Reproductive: Prostate is unremarkable. Other: Small fat containing periumbilical hernia is noted. Musculoskeletal: No acute or significant osseous findings. IMPRESSION: Focal wall thickening and other inflammatory changes are seen along the posterior wall of gastric body concerning for possible peptic ulcer disease. Small amount of fluid is seen posterior  to the second portion of duodenum which may be related to peptic ulcer disease is well. 3.3 cm infrarenal abdominal aortic aneurysm. Recommend followup by ultrasound in 3 years. This recommendation follows ACR consensus guidelines: White Paper of the ACR Incidental Findings Committee II on Vascular Findings. J Am Coll Radiol 2013; 10:789-794. Aortic Atherosclerosis (ICD10-I70.0). Electronically Signed   By: JMarijo Conception M.D.   On: 05/02/2017 13:20   Mr Abdomen Mrcp Wo Contrast  Result Date: 05/04/2017 CLINICAL DATA:  72year old male with history of abnormal liver function tests. EXAM: MRI ABDOMEN WITHOUT CONTRAST  (INCLUDING MRCP) TECHNIQUE: Multiplanar multisequence MR imaging of the abdomen was performed. Heavily T2-weighted images of the biliary and pancreatic ducts were obtained, and three-dimensional MRCP images were rendered by post processing. COMPARISON:  No prior abdominal MRI. CT the abdomen and pelvis 05/02/2017. FINDINGS: Lower chest: Moderate right pleural effusion lying dependently. Increased signal intensity in portions of the right lower lobe dependently. Hepatobiliary: Diffuse loss of signal intensity throughout the hepatic parenchyma, indicative of hepatic steatosis. No suspicious cystic or solid hepatic lesions noted on today's noncontrast examination. No intra or extrahepatic biliary ductal dilatation on MRCP images. Common bile duct measures 6 mm in the porta hepatis. No filling defects along the common bile duct to suggest choledocholithiasis. Several tiny filling defects within the gallbladder, compatible with small gallstones. No surrounding inflammatory changes to suggest an acute cholecystitis at this time. Pancreas: No definite pancreatic mass appreciated on today's noncontrast examination. Increased T2 signal intensity adjacent to  the head and proximal body of the pancreas, concerning for an acute pancreatitis. No pancreatic ductal dilatation noted on MRCP images. No well-defined  peripancreatic fluid collections are noted. Spleen:  Unremarkable. Adrenals/Urinary Tract: Unenhanced appearance of the kidneys and bilateral adrenal glands is unremarkable. Stomach/Bowel: No hydroureteronephrosis in the visualized portions of the abdomen. Vascular/Lymphatic: Fusiform aneurysmal dilatation of the infrarenal abdominal aorta measuring 2.9 x 3.5 cm. No lymphadenopathy noted in the abdomen. Other: Increased fluid signal intensity throughout the right side of the retroperitoneum, presumably related to acute pancreatitis. Trace volume of ascites in the right side of the abdomen. Musculoskeletal: No aggressive appearing osseous lesions are noted in the visualized portions of the skeleton. IMPRESSION: 1. No signs of biliary tract obstruction. 2. Cholelithiasis without evidence of acute cholecystitis at this time. 3. Inflammatory changes adjacent to the head and proximal body of the pancreas concerning for an acute pancreatitis. 4. Moderate right pleural effusion lying dependently with some associated passive subsegmental atelectasis in the right lower lobe. 5. Hepatic steatosis. Electronically Signed   By: Vinnie Langton M.D.   On: 05/04/2017 12:19   Mr 3d Recon At Scanner  Result Date: 05/04/2017 CLINICAL DATA:  72 year old male with history of abnormal liver function tests. EXAM: MRI ABDOMEN WITHOUT CONTRAST  (INCLUDING MRCP) TECHNIQUE: Multiplanar multisequence MR imaging of the abdomen was performed. Heavily T2-weighted images of the biliary and pancreatic ducts were obtained, and three-dimensional MRCP images were rendered by post processing. COMPARISON:  No prior abdominal MRI. CT the abdomen and pelvis 05/02/2017. FINDINGS: Lower chest: Moderate right pleural effusion lying dependently. Increased signal intensity in portions of the right lower lobe dependently. Hepatobiliary: Diffuse loss of signal intensity throughout the hepatic parenchyma, indicative of hepatic steatosis. No suspicious cystic  or solid hepatic lesions noted on today's noncontrast examination. No intra or extrahepatic biliary ductal dilatation on MRCP images. Common bile duct measures 6 mm in the porta hepatis. No filling defects along the common bile duct to suggest choledocholithiasis. Several tiny filling defects within the gallbladder, compatible with small gallstones. No surrounding inflammatory changes to suggest an acute cholecystitis at this time. Pancreas: No definite pancreatic mass appreciated on today's noncontrast examination. Increased T2 signal intensity adjacent to the head and proximal body of the pancreas, concerning for an acute pancreatitis. No pancreatic ductal dilatation noted on MRCP images. No well-defined peripancreatic fluid collections are noted. Spleen:  Unremarkable. Adrenals/Urinary Tract: Unenhanced appearance of the kidneys and bilateral adrenal glands is unremarkable. Stomach/Bowel: No hydroureteronephrosis in the visualized portions of the abdomen. Vascular/Lymphatic: Fusiform aneurysmal dilatation of the infrarenal abdominal aorta measuring 2.9 x 3.5 cm. No lymphadenopathy noted in the abdomen. Other: Increased fluid signal intensity throughout the right side of the retroperitoneum, presumably related to acute pancreatitis. Trace volume of ascites in the right side of the abdomen. Musculoskeletal: No aggressive appearing osseous lesions are noted in the visualized portions of the skeleton. IMPRESSION: 1. No signs of biliary tract obstruction. 2. Cholelithiasis without evidence of acute cholecystitis at this time. 3. Inflammatory changes adjacent to the head and proximal body of the pancreas concerning for an acute pancreatitis. 4. Moderate right pleural effusion lying dependently with some associated passive subsegmental atelectasis in the right lower lobe. 5. Hepatic steatosis. Electronically Signed   By: Vinnie Langton M.D.   On: 05/04/2017 12:19    Medications / Allergies: per  chart  Antibiotics: Anti-infectives (From admission, onward)   None        Note: Portions of this report may have been  transcribed using voice recognition software. Every effort was made to ensure accuracy; however, inadvertent computerized transcription errors may be present.   Any transcriptional errors that result from this process are unintentional.    Nathan Baxter, M.D., F.A.C.S. Gastrointestinal and Minimally Invasive Surgery [facility] [address], [place], [place] [address] [phone] Main / Paging   05/05/2017

## 2017-05-06 ENCOUNTER — Other Ambulatory Visit: Payer: Self-pay | Admitting: General Surgery

## 2017-05-06 ENCOUNTER — Encounter (HOSPITAL_COMMUNITY): Payer: Self-pay | Admitting: Registered Nurse

## 2017-05-06 ENCOUNTER — Encounter (HOSPITAL_COMMUNITY)
Admission: EM | Disposition: A | Discharge status: Home / Self Care | Payer: Self-pay | Source: Home / Self Care | Attending: Internal Medicine

## 2017-05-06 ENCOUNTER — Inpatient Hospital Stay (HOSPITAL_COMMUNITY): Payer: Medicare HMO | Admitting: Registered Nurse

## 2017-05-06 DIAGNOSIS — K811 Chronic cholecystitis: Secondary | ICD-10-CM | POA: Diagnosis not present

## 2017-05-06 HISTORY — PX: CHOLECYSTECTOMY: SHX55

## 2017-05-06 LAB — GLUCOSE, CAPILLARY
Glucose-Capillary: 121 mg/dL — ABNORMAL HIGH (ref 65–99)
Glucose-Capillary: 125 mg/dL — ABNORMAL HIGH (ref 65–99)
Glucose-Capillary: 134 mg/dL — ABNORMAL HIGH (ref 65–99)

## 2017-05-06 LAB — HEPATIC FUNCTION PANEL
ALK PHOS: 71 U/L (ref 38–126)
ALT: 102 U/L — AB (ref 17–63)
AST: 56 U/L — ABNORMAL HIGH (ref 15–41)
Albumin: 2.7 g/dL — ABNORMAL LOW (ref 3.5–5.0)
BILIRUBIN DIRECT: 0.9 mg/dL — AB (ref 0.1–0.5)
BILIRUBIN INDIRECT: 1.4 mg/dL — AB (ref 0.3–0.9)
Total Bilirubin: 2.3 mg/dL — ABNORMAL HIGH (ref 0.3–1.2)
Total Protein: 5.9 g/dL — ABNORMAL LOW (ref 6.5–8.1)

## 2017-05-06 LAB — LIPASE, BLOOD: LIPASE: 29 U/L (ref 11–51)

## 2017-05-06 SURGERY — LAPAROSCOPIC CHOLECYSTECTOMY
Anesthesia: General

## 2017-05-06 MED ORDER — LIDOCAINE HCL (PF) 1 % IJ SOLN
INTRAMUSCULAR | Status: AC
  Filled 2017-05-06: qty 30

## 2017-05-06 MED ORDER — ONDANSETRON HCL 4 MG/2ML IJ SOLN
INTRAMUSCULAR | Status: AC
  Filled 2017-05-06: qty 2

## 2017-05-06 MED ORDER — ONDANSETRON HCL 4 MG/2ML IJ SOLN
INTRAMUSCULAR | Status: DC | PRN
  Administered 2017-05-06: 4 mg via INTRAVENOUS

## 2017-05-06 MED ORDER — FENTANYL CITRATE (PF) 100 MCG/2ML IJ SOLN
INTRAMUSCULAR | Status: AC
  Filled 2017-05-06: qty 2

## 2017-05-06 MED ORDER — BUPIVACAINE-EPINEPHRINE (PF) 0.5% -1:200000 IJ SOLN
INTRAMUSCULAR | Status: DC | PRN
  Administered 2017-05-06: 10 mL

## 2017-05-06 MED ORDER — PROPOFOL 10 MG/ML IV BOLUS
INTRAVENOUS | Status: AC
  Filled 2017-05-06: qty 20

## 2017-05-06 MED ORDER — BUPIVACAINE-EPINEPHRINE (PF) 0.5% -1:200000 IJ SOLN
INTRAMUSCULAR | Status: AC
  Filled 2017-05-06: qty 30

## 2017-05-06 MED ORDER — ADULT MULTIVITAMIN W/MINERALS CH
1.0000 | ORAL_TABLET | Freq: Every day | ORAL | Status: DC
  Administered 2017-05-06: 1 via ORAL
  Filled 2017-05-06: qty 1

## 2017-05-06 MED ORDER — PROPOFOL 10 MG/ML IV BOLUS
INTRAVENOUS | Status: DC | PRN
  Administered 2017-05-06: 150 mg via INTRAVENOUS

## 2017-05-06 MED ORDER — LACTATED RINGERS IV SOLN
INTRAVENOUS | Status: DC
  Administered 2017-05-06: 11:00:00 via INTRAVENOUS

## 2017-05-06 MED ORDER — DEXAMETHASONE SODIUM PHOSPHATE 10 MG/ML IJ SOLN
INTRAMUSCULAR | Status: DC | PRN
  Administered 2017-05-06: 10 mg via INTRAVENOUS

## 2017-05-06 MED ORDER — LIDOCAINE 2% (20 MG/ML) 5 ML SYRINGE
INTRAMUSCULAR | Status: AC
  Filled 2017-05-06: qty 5

## 2017-05-06 MED ORDER — FENTANYL CITRATE (PF) 250 MCG/5ML IJ SOLN
INTRAMUSCULAR | Status: AC
  Filled 2017-05-06: qty 5

## 2017-05-06 MED ORDER — LACTATED RINGERS IR SOLN
Status: DC | PRN
  Administered 2017-05-06: 1

## 2017-05-06 MED ORDER — PHENYLEPHRINE 40 MCG/ML (10ML) SYRINGE FOR IV PUSH (FOR BLOOD PRESSURE SUPPORT)
PREFILLED_SYRINGE | INTRAVENOUS | Status: DC | PRN
  Administered 2017-05-06: 80 ug via INTRAVENOUS

## 2017-05-06 MED ORDER — SUGAMMADEX SODIUM 500 MG/5ML IV SOLN
INTRAVENOUS | Status: DC | PRN
  Administered 2017-05-06: 450 mg via INTRAVENOUS

## 2017-05-06 MED ORDER — OXYCODONE HCL 5 MG PO TABS
5.0000 mg | ORAL_TABLET | ORAL | Status: DC | PRN
  Administered 2017-05-06: 5 mg via ORAL
  Administered 2017-05-07: 10 mg via ORAL
  Administered 2017-05-07: 5 mg via ORAL
  Filled 2017-05-06: qty 2
  Filled 2017-05-06 (×2): qty 1

## 2017-05-06 MED ORDER — KETOROLAC TROMETHAMINE 30 MG/ML IJ SOLN: 15.0000 mg | Freq: Once | INTRAMUSCULAR | Status: DC | PRN

## 2017-05-06 MED ORDER — SIMETHICONE 80 MG PO CHEW
160.0000 mg | CHEWABLE_TABLET | Freq: Four times a day (QID) | ORAL | Status: DC | PRN
  Administered 2017-05-06 – 2017-05-08 (×4): 160 mg via ORAL
  Administered 2017-05-08: 80 mg via ORAL
  Filled 2017-05-06 (×6): qty 2

## 2017-05-06 MED ORDER — HYDROMORPHONE HCL 1 MG/ML IJ SOLN
INTRAMUSCULAR | Status: AC
  Administered 2017-05-06: 0.5 mg via INTRAVENOUS
  Filled 2017-05-06: qty 1

## 2017-05-06 MED ORDER — PHENYLEPHRINE 40 MCG/ML (10ML) SYRINGE FOR IV PUSH (FOR BLOOD PRESSURE SUPPORT)
PREFILLED_SYRINGE | INTRAVENOUS | Status: AC
  Filled 2017-05-06: qty 10

## 2017-05-06 MED ORDER — SUGAMMADEX SODIUM 500 MG/5ML IV SOLN
INTRAVENOUS | Status: AC
  Filled 2017-05-06: qty 5

## 2017-05-06 MED ORDER — PROMETHAZINE HCL 25 MG/ML IJ SOLN: 6.2500 mg | INTRAMUSCULAR | Status: DC | PRN

## 2017-05-06 MED ORDER — HYDROMORPHONE HCL 1 MG/ML IJ SOLN
0.2500 mg | INTRAMUSCULAR | Status: DC | PRN
  Administered 2017-05-06 (×3): 0.5 mg via INTRAVENOUS

## 2017-05-06 MED ORDER — DEXAMETHASONE SODIUM PHOSPHATE 10 MG/ML IJ SOLN
INTRAMUSCULAR | Status: AC
Start: 2017-05-06 — End: ?
  Filled 2017-05-06: qty 1

## 2017-05-06 MED ORDER — LIDOCAINE HCL (PF) 1 % IJ SOLN
INTRAMUSCULAR | Status: DC | PRN
  Administered 2017-05-06: 10 mL

## 2017-05-06 MED ORDER — ROCURONIUM BROMIDE 10 MG/ML (PF) SYRINGE
PREFILLED_SYRINGE | INTRAVENOUS | Status: AC
  Filled 2017-05-06: qty 5

## 2017-05-06 MED ORDER — FENTANYL CITRATE (PF) 250 MCG/5ML IJ SOLN
INTRAMUSCULAR | Status: DC | PRN
  Administered 2017-05-06: 100 ug via INTRAVENOUS
  Administered 2017-05-06: 50 ug via INTRAVENOUS

## 2017-05-06 MED ORDER — LIDOCAINE 2% (20 MG/ML) 5 ML SYRINGE
INTRAMUSCULAR | Status: DC | PRN
  Administered 2017-05-06: 60 mg via INTRAVENOUS

## 2017-05-06 MED ORDER — ROCURONIUM BROMIDE 10 MG/ML (PF) SYRINGE
PREFILLED_SYRINGE | INTRAVENOUS | Status: DC | PRN
  Administered 2017-05-06: 60 mg via INTRAVENOUS

## 2017-05-06 MED ORDER — IOPAMIDOL (ISOVUE-300) INJECTION 61%
INTRAVENOUS | Status: AC
  Filled 2017-05-06: qty 50

## 2017-05-06 SURGICAL SUPPLY — 39 items
APPLIER CLIP ROT 10 11.4 M/L (STAPLE) ×2
CHLORAPREP W/TINT 26ML (MISCELLANEOUS) ×2 IMPLANT
CLIP APPLIE ROT 10 11.4 M/L (STAPLE) ×1 IMPLANT
CLIP VESOLOCK MED LG 6/CT (CLIP) IMPLANT
COVER MAYO STAND STRL (DRAPES) IMPLANT
COVER SURGICAL LIGHT HANDLE (MISCELLANEOUS) ×2 IMPLANT
DECANTER SPIKE VIAL GLASS SM (MISCELLANEOUS) ×2 IMPLANT
DERMABOND ADVANCED (GAUZE/BANDAGES/DRESSINGS)
DERMABOND ADVANCED .7 DNX12 (GAUZE/BANDAGES/DRESSINGS) IMPLANT
DRAPE C-ARM 42X120 X-RAY (DRAPES) IMPLANT
ELECT L-HOOK LAP 45CM DISP (ELECTROSURGICAL)
ELECT PENCIL ROCKER SW 15FT (MISCELLANEOUS) ×2 IMPLANT
ELECT REM PT RETURN 15FT ADLT (MISCELLANEOUS) ×2 IMPLANT
ELECTRODE L-HOOK LAP 45CM DISP (ELECTROSURGICAL) IMPLANT
ENDOLOOP SUT PDS II  0 18 (SUTURE) ×1
ENDOLOOP SUT PDS II 0 18 (SUTURE) ×1 IMPLANT
GLOVE BIO SURGEON STRL SZ 6 (GLOVE) ×2 IMPLANT
GLOVE INDICATOR 6.5 STRL GRN (GLOVE) ×2 IMPLANT
GOWN STRL REUS W/TWL 2XL LVL3 (GOWN DISPOSABLE) ×2 IMPLANT
GOWN STRL REUS W/TWL XL LVL3 (GOWN DISPOSABLE) ×4 IMPLANT
HEMOSTAT SNOW SURGICEL 2X4 (HEMOSTASIS) ×2 IMPLANT
KIT BASIN OR (CUSTOM PROCEDURE TRAY) ×2 IMPLANT
L-HOOK LAP DISP 36CM (ELECTROSURGICAL) ×2
LHOOK LAP DISP 36CM (ELECTROSURGICAL) ×1 IMPLANT
POSITIONER SURGICAL ARM (MISCELLANEOUS) IMPLANT
POUCH SPECIMEN RETRIEVAL 10MM (ENDOMECHANICALS) ×2 IMPLANT
SCISSORS LAP 5X35 DISP (ENDOMECHANICALS) ×2 IMPLANT
SET CHOLANGIOGRAPH MIX (MISCELLANEOUS) IMPLANT
SET IRRIG TUBING LAPAROSCOPIC (IRRIGATION / IRRIGATOR) ×2 IMPLANT
SLEEVE XCEL OPT CAN 5 100 (ENDOMECHANICALS) ×2 IMPLANT
SUT MNCRL AB 4-0 PS2 18 (SUTURE) ×2 IMPLANT
TAPE CLOTH 4X10 WHT NS (GAUZE/BANDAGES/DRESSINGS) IMPLANT
TOWEL OR 17X26 10 PK STRL BLUE (TOWEL DISPOSABLE) ×2 IMPLANT
TOWEL OR NON WOVEN STRL DISP B (DISPOSABLE) ×2 IMPLANT
TRAY LAPAROSCOPIC (CUSTOM PROCEDURE TRAY) ×2 IMPLANT
TROCAR BLADELESS OPT 5 100 (ENDOMECHANICALS) ×2 IMPLANT
TROCAR XCEL BLUNT TIP 100MML (ENDOMECHANICALS) ×2 IMPLANT
TROCAR XCEL NON-BLD 11X100MML (ENDOMECHANICALS) ×2 IMPLANT
TUBING INSUF HEATED (TUBING) IMPLANT

## 2017-05-06 NOTE — Progress Notes (Signed)
PROGRESS NOTE    Nathan SciaraRobert Baxter  RUE:[id number] DOB: 02/16/1946 DOA: 05/02/2017 PCP: Patient, No Pcp Per    Brief Narrative:  Nathan Baxter is a 72 y.o. male with medical history significant of HTN, hyperlipidemia, and prediabetes; who presents with complaints of abdominal pain.  Patient reports having acute onset of generalized crampy abdominal pain. Upon admission into the emergency department patient was noted to be afebrile, pulse in the 70s, blood pressure 138/77-, O2 saturations 200/110, and O2 saturations 89-100% on room air.  Labs reviewed significant for RBC 11.7, glucose 252,  lipase 3890, AST 469, ALT 272, and total bilirubin 2.  Urinalysis is negative for any acute abnormalities.  CT scan of the abdomen showed focal wall thickening and inflammatory changes along the posterior wall of the gastric body reported to be concerning for peptic ulcer and 3.3 cm infrarenal abdominal aortic aneurysm. He was admitted for acute pancreatitis and evaluation of PUD.       Assessment & Plan:   Principal Problem:   Gallstone pancreatitis Active Problems:   Leukocytosis   Hypertensive urgency   Abdominal aortic aneurysm (AAA) (HCC)   Hyperglycemia   Chronic cholecystitis with calculus   Acute Pancreatitis of unclear etiology, possibly biliary pancreatitis.  Improving abdominal pain and nausea, but not back to baseline.  MRCP shows cholelithiasis , no acute cholecystitis, inflammation of the head of the pancrease suggestive of acute pancreatitis.  Improving lipase and liver enzymes. Resume IV fluids, IV morphine and anti emetics.  Appreciate GI recommendations.  Start pt on clear liquid diet and advance as tolerated.  Will request surgery evaluation for elective cholecystectomy once pancreatitis improves.  Surgery consulted by GI, and pt underwent lap assisted cholecystectomy.     Hyperglycemia:  Pt is prediabetic.  hgba1c is 6.3.  Resume SSI.  CBG (last 3)  Recent Labs     05/06/17 0004 05/06/17 0605 05/06/17 1327  GLUCAP 121* 134* 125*      Leukocytosis;  Probably reactive/ pancreatitis. UA IS NEGATIVE.  Improving.   Hypertensive urgency:  Slightly better parameters.  Added norvasc for bp. Prn hydralazine    AAA:   Incidental finding on CT scan noted to be 3.3 cm.   -  Recommend outpatient follow-up and ultrasound per guidelines      DVT prophylaxis: lovenox.  Code Status: FULL CODE.  Family Communication: NONE AT BEDSIDE.  Disposition Plan: possible d/c home once pain is better and he is able to tolerate soft diet.    Consultants:   Gastroenterology.   Surgery.    Procedures: MRCP   Cholecystectomy.    Antimicrobials:none.    Subjective: Drowsy, no nausea, slight pain.    Objective: Vitals:   05/06/17 1423 05/06/17 1438 05/06/17 1506 05/06/17 1609  BP: (!) 170/88  (!) 196/94 (!) 156/75  Pulse:   73 78  Resp: 15  20 18   Temp: 98.5 F (36.9 C)   98 F (36.7 C)  TempSrc:    Oral  SpO2: 98% 96% 97% 93%  Weight:      Height:        Intake/Output Summary (Last 24 hours) at 05/06/2017 1623 Last data filed at 05/06/2017 1400 Gross per 24 hour  Intake 1115 ml  Output 25 ml  Net 1090 ml   Filed Weights   05/02/17 1058  Weight: 110.7 kg (244 lb)    Examination:  General exam: Appears uncomfortable. Not in distress.  Respiratory system: good air entry bilateral. No wheezing or rhonchi.  Cardiovascular  system: S1 & S2 heard. RRR.  Gastrointestinal system: abd is soft , mildly distended, mildly tender,  bowel sounds heard.  Central nervous system: Alert and oriented. No focal neurological deficits. Extremities: Symmetric 5 x 5 power.no pedal edema.  Skin: No rashes, lesions or ulcers Psychiatry:  Mood & affect appropriate.     Data Reviewed: I have personally reviewed following labs and imaging studies  CBC: Recent Labs  Lab 05/02/17 1114 05/03/17 0539 05/05/17 0543  WBC 11.7* 14.9* 13.9*  HGB 15.7 14.2  12.7*  HCT 45.9 42.3 38.6*  MCV 85.0 87.6 88.5  PLT 284 269 215   Basic Metabolic Panel: Recent Labs  Lab 05/02/17 1114 05/03/17 0539 05/04/17 1006  NA 136 141 140  K 4.2 3.7 3.5  CL 100* 106 108  CO2 23 26 24   GLUCOSE 252* 144* 147*  BUN 15 14 16   CREATININE 0.92 0.77 0.78  CALCIUM 9.2 8.6* 8.2*   GFR: Estimated Creatinine Clearance: 107.2 mL/min (by C-G formula based on SCr of 0.78 mg/dL). Liver Function Tests: Recent Labs  Lab 05/02/17 1114 05/03/17 0539 05/04/17 1006 05/06/17 0635  AST 469* 137* 71* 56*  ALT 272* 172* 92* 102*  ALKPHOS 72 68 59 71  BILITOT 2.0* 1.9* 1.9* 2.3*  PROT 7.8 6.4* 5.9* 5.9*  ALBUMIN 4.3 3.7 3.2* 2.7*   Recent Labs  Lab 05/02/17 1114 05/03/17 0539 05/06/17 0635  LIPASE 3,890* 305* 29   No results for input(s): AMMONIA in the last 168 hours. Coagulation Profile: No results for input(s): INR, PROTIME in the last 168 hours. Cardiac Enzymes: No results for input(s): CKTOTAL, CKMB, CKMBINDEX, TROPONINI in the last 168 hours. BNP (last 3 results) No results for input(s): PROBNP in the last 8760 hours. HbA1C: No results for input(s): HGBA1C in the last 72 hours. CBG: Recent Labs  Lab 05/05/17 1221 05/05/17 1801 05/06/17 0004 05/06/17 0605 05/06/17 1327  GLUCAP 128* 129* 121* 134* 125*   Lipid Profile: No results for input(s): CHOL, HDL, LDLCALC, TRIG, CHOLHDL, LDLDIRECT in the last 72 hours. Thyroid Function Tests: No results for input(s): TSH, T4TOTAL, FREET4, T3FREE, THYROIDAB in the last 72 hours. Anemia Panel: No results for input(s): VITAMINB12, FOLATE, FERRITIN, TIBC, IRON, RETICCTPCT in the last 72 hours. Sepsis Labs: No results for input(s): PROCALCITON, LATICACIDVEN in the last 168 hours.  Recent Results (from the past 240 hour(s))  Surgical pcr screen     Status: None   Collection Time: 05/05/17 10:03 AM  Result Value Ref Range Status   MRSA, PCR NEGATIVE NEGATIVE Final   Staphylococcus aureus NEGATIVE  NEGATIVE Final    Comment: (NOTE) The Xpert SA Assay (FDA approved for NASAL specimens in patients 72 years of age and older), is one component of a comprehensive surveillance program. It is not intended to diagnose infection nor to guide or monitor treatment. Performed at [facility], [address], [place], [place] [address]          Radiology Studies: No results found.      Scheduled Meds:  Continuous Infusions:    LOS: 4 days    Time spent: 35 min    Kathlen Mody, MD [facility] Pager [phone] If 7PM-7AM, please contact night-coverage [email or web address] Password [email or web address] 05/06/2017, 4:23 PM

## 2017-05-06 NOTE — Anesthesia Preprocedure Evaluation (Signed)
Anesthesia Evaluation  Patient identified by MRN, date of birth, ID band Patient awake  General Assessment Comment:Gallstone pancreatitis  Reviewed: Allergy & Precautions, NPO status , Patient's Chart, lab work & pertinent test results  Airway Mallampati: II  TM Distance: >3 FB Neck ROM: Full    Dental no notable dental hx.    Pulmonary neg pulmonary ROS, former smoker,    Pulmonary exam normal breath sounds clear to auscultation       Cardiovascular hypertension, + Peripheral Vascular Disease  Normal cardiovascular exam Rhythm:Regular Rate:Normal     Neuro/Psych negative neurological ROS  negative psych ROS   GI/Hepatic negative GI ROS, Neg liver ROS,   Endo/Other  negative endocrine ROS  Renal/GU negative Renal ROS  negative genitourinary   Musculoskeletal negative musculoskeletal ROS (+)   Abdominal   Peds negative pediatric ROS (+)  Hematology  (+) anemia ,   Anesthesia Other Findings   Reproductive/Obstetrics negative OB ROS                             Anesthesia Physical Anesthesia Plan  ASA: III  Anesthesia Plan: General   Post-op Pain Management:    Induction: Intravenous  PONV Risk Score and Plan: 2 and Ondansetron, Dexamethasone and Treatment may vary due to age or medical condition  Airway Management Planned: Oral ETT  Additional Equipment:   Intra-op Plan:   Post-operative Plan: Extubation in OR  Informed Consent: I have reviewed the patients History and Physical, chart, labs and discussed the procedure including the risks, benefits and alternatives for the proposed anesthesia with the patient or authorized representative who has indicated his/her understanding and acceptance.   Dental advisory given  Plan Discussed with: CRNA and Surgeon  Anesthesia Plan Comments:         Anesthesia Quick Evaluation

## 2017-05-06 NOTE — Anesthesia Postprocedure Evaluation (Signed)
Anesthesia Post Note  Patient: Nathan Baxter  Procedure(s) Performed: LAPAROSCOPIC CHOLECYSTECTOMY (N/A )     Patient location during evaluation: PACU Anesthesia Type: General Level of consciousness: awake and alert Pain management: pain level controlled Vital Signs Assessment: post-procedure vital signs reviewed and stable Respiratory status: spontaneous breathing, nonlabored ventilation, respiratory function stable and patient connected to nasal cannula oxygen Cardiovascular status: blood pressure returned to baseline and stable Postop Assessment: no apparent nausea or vomiting Anesthetic complications: no    Last Vitals:  Vitals:   05/06/17 1330 05/06/17 1345  BP: (!) 165/89 (!) 172/91  Pulse: 73 70  Resp: (!) 24 20  Temp:    SpO2: 95% 96%    Last Pain:  Vitals:   05/06/17 1345  TempSrc:   PainSc: 5                  [name] S

## 2017-05-06 NOTE — Progress Notes (Signed)
Walked in patient's room to check on him and pt had pulled his IV out and had taken a shower. Pt stated that "the IV looked like it was finished so I went ahead and pulled it out and decided to take a shower because I needed to get clean." Explained to patient the need to make his nurses aware of his plans and if anything was needed with his IV. Patient stated he understood.  Tried to access patient's other IV on his right hand and it was occluded. Removed IV. No current orders for IV fluids, so will monitor patient's vitals. VSS.

## 2017-05-06 NOTE — Transfer of Care (Signed)
Immediate Anesthesia Transfer of Care Note  Patient: Nathan Baxter  Procedure(s) Performed: LAPAROSCOPIC CHOLECYSTECTOMY (N/A )  Patient Location: PACU  Anesthesia Type:General  Level of Consciousness: sedated  Airway & Oxygen Therapy: Patient Spontanous Breathing and Patient connected to face mask oxygen  Post-op Assessment: Report given to RN and Post -op Vital signs reviewed and stable  Post vital signs: Reviewed and stable  Last Vitals:  Vitals:   05/06/17 0729 05/06/17 1113  BP: (!) 164/74   Pulse:    Resp:    Temp:  37.7 C  SpO2:      Last Pain:  Vitals:   05/06/17 1113  TempSrc: Oral  PainSc: 0-No pain      Patients Stated Pain Goal: 3 (05/06/17 0319)  Complications: No apparent anesthesia complications

## 2017-05-06 NOTE — Interval H&P Note (Signed)
History and Physical Interval Note:  05/06/2017 10:56 AM  Nathan Baxter  has presented today for surgery, with the diagnosis of cholecystitis  The various methods of treatment have been discussed with the patient and family. After consideration of risks, benefits and other options for treatment, the patient has consented to  Procedure(s): LAPAROSCOPIC CHOLECYSTECTOMY WITH INTRAOPERATIVE CHOLANGIOGRAM (N/A) as a surgical intervention .  The patient's history has been reviewed, patient examined, no change in status, stable for surgery.  I have reviewed the patient's chart and labs.  Questions were answered to the patient's satisfaction.     Almond LintFaera Bridget Westbrooks

## 2017-05-06 NOTE — Plan of Care (Signed)
  Elimination: Will not experience complications related to bowel motility 05/06/2017 1325 - Progressing by William Daltonarpenter, Lamark Schue L, RN   Pain Managment: General experience of comfort will improve 05/06/2017 1325 - Progressing by William Daltonarpenter, Tamisha Nordstrom L, RN   Safety: Ability to remain free from injury will improve 05/06/2017 1325 - Progressing by William Daltonarpenter, Jaylyne Breese L, RN

## 2017-05-06 NOTE — Op Note (Signed)
Laparoscopic Cholecystectomy  Indications: This patient presents with acute calculous cholecystitis and will undergo laparoscopic cholecystectomy.  Pre-operative Diagnosis: See above.  Post-operative Diagnosis: Same  Surgeon: Almond Lint   Assistants: Wells Guiles, PA-C  Anesthesia: General endotracheal anesthesia and local  ASA Class: 3  Procedure Details  The patient was seen again in the Holding Room. The risks, benefits, complications, treatment options, and expected outcomes were discussed with the patient. The possibilities of  bleeding, recurrent infection, damage to nearby structures, the need for additional procedures, failure to diagnose a condition, the possible need to convert to an open procedure, and creating a complication requiring transfusion or operation were discussed with the patient. The likelihood of improving the patient's symptoms with return to their baseline status is good.    The patient and/or family concurred with the proposed plan, giving informed consent. The site of surgery properly noted. The patient was taken to Operating Room, and the procedure verified as Laparoscopic Cholecystectomy with possible Intraoperative Cholangiogram. A Time Out was held and the above information confirmed.  Prior to the induction of general anesthesia, antibiotic prophylaxis was administered. General endotracheal anesthesia was then administered and tolerated well. After the induction, the abdomen was prepped with Chloraprep and draped in the sterile fashion. The patient was positioned in the supine position.  Local anesthetic agent was injected into the skin near the umbilicus and an incision made. We dissected down to the abdominal fascia with blunt dissection.  The fascia was incised vertically and we entered the peritoneal cavity bluntly.  A pursestring suture of 0-Vicryl was placed around the fascial opening.  The Hasson cannula was inserted and secured with the stay suture.   Pneumoperitoneum was then created with CO2 and tolerated well without any adverse changes in the patient's vital signs. An 11-mm port was placed in the subxiphoid position.  Two 5-mm ports were placed in the right upper quadrant. All skin incisions were infiltrated with a local anesthetic agent before making the incision and placing the trocars.   We positioned the patient in reverse Trendelenburg, tilted slightly to the patient's left.  The gallbladder was identified, the fundus grasped and retracted cephalad. The gallbladder was a bit intrahepatic.  Adhesions were lysed bluntly and with the electrocautery where indicated, taking care not to injure any adjacent organs or viscus. The infundibulum was grasped and retracted laterally, exposing the peritoneum overlying the triangle of Calot. This was then divided and exposed in a blunt fashion. The gallbladder fundus was very friable and falling apart.  The artery was also friable.  This was clipped several times with an additional branch.   A critical view of the infundibulum/cystic duct was obtained.  Due to the friability of the infundibulum, a cholangiogram was not performed.  An endoloop was placed over the infundibulum.    The gallbladder was dissected from the liver bed in retrograde fashion with the electrocautery. The gallbladder was removed and placed in an Endocatch bag.  The gallbladder and Endocatch bag were then removed through the umbilical port site.  The liver bed was irrigated and inspected. Hemostasis was achieved with the electrocautery. Copious irrigation was utilized and was repeatedly aspirated until clear.    We again inspected the right upper quadrant for hemostasis.  Pneumoperitoneum was released as we removed the trocars.   The pursestring suture was used to close the umbilical fascia.  4-0 Monocryl was used to close the skin.   The skin was cleaned and dry, and Dermabond was applied. The patient  was then extubated and brought to the  recovery room in stable condition. Instrument, sponge, and needle counts were correct at closure and at the conclusion of the case.   Findings: Very friable gallbladder and cystic duct.    Estimated Blood Loss: 50 mL         Drains: none          Specimens: Gallbladder to pathology       Complications: None; patient tolerated the procedure well.         Disposition: PACU - hemodynamically stable.         Condition: stable

## 2017-05-06 NOTE — Anesthesia Procedure Notes (Signed)
Procedure Name: Intubation Date/Time: 05/06/2017 11:36 AM Performed by: Talbot Grumbling, CRNA Pre-anesthesia Checklist: Patient identified, Emergency Drugs available, Suction available and Patient being monitored Patient Re-evaluated:Patient Re-evaluated prior to induction Oxygen Delivery Method: Circle system utilized Preoxygenation: Pre-oxygenation with 100% oxygen Induction Type: IV induction Ventilation: Mask ventilation without difficulty Laryngoscope Size: Mac and 3 Grade View: Grade I Tube type: Oral Tube size: 8.0 mm Number of attempts: 1 Airway Equipment and Method: Stylet Placement Confirmation: ETT inserted through vocal cords under direct vision,  positive ETCO2 and breath sounds checked- equal and bilateral Secured at: 23 cm Tube secured with: Tape Dental Injury: Teeth and Oropharynx as per pre-operative assessment

## 2017-05-07 LAB — CBC
HCT: 38.1 % — ABNORMAL LOW (ref 39.0–52.0)
HEMOGLOBIN: 13 g/dL (ref 13.0–17.0)
MCH: 29.3 pg (ref 26.0–34.0)
MCHC: 34.1 g/dL (ref 30.0–36.0)
MCV: 86 fL (ref 78.0–100.0)
PLATELETS: 341 10*3/uL (ref 150–400)
RBC: 4.43 MIL/uL (ref 4.22–5.81)
RDW: 13.5 % (ref 11.5–15.5)
WBC: 14.7 10*3/uL — AB (ref 4.0–10.5)

## 2017-05-07 LAB — COMPREHENSIVE METABOLIC PANEL
ALK PHOS: 71 U/L (ref 38–126)
ALT: 89 U/L — AB (ref 17–63)
ANION GAP: 11 (ref 5–15)
AST: 52 U/L — ABNORMAL HIGH (ref 15–41)
Albumin: 2.7 g/dL — ABNORMAL LOW (ref 3.5–5.0)
BUN: 16 mg/dL (ref 6–20)
CO2: 23 mmol/L (ref 22–32)
CREATININE: 0.61 mg/dL (ref 0.61–1.24)
Calcium: 8.5 mg/dL — ABNORMAL LOW (ref 8.9–10.3)
Chloride: 105 mmol/L (ref 101–111)
Glucose, Bld: 172 mg/dL — ABNORMAL HIGH (ref 65–99)
Potassium: 3.3 mmol/L — ABNORMAL LOW (ref 3.5–5.1)
SODIUM: 139 mmol/L (ref 135–145)
Total Bilirubin: 1.8 mg/dL — ABNORMAL HIGH (ref 0.3–1.2)
Total Protein: 6.2 g/dL — ABNORMAL LOW (ref 6.5–8.1)

## 2017-05-07 MED ORDER — IBUPROFEN 200 MG PO TABS: 600.0000 mg | ORAL_TABLET | Freq: Four times a day (QID) | ORAL | Status: DC | PRN

## 2017-05-07 MED ORDER — MORPHINE SULFATE (PF) 4 MG/ML IV SOLN: 2.0000 mg | INTRAVENOUS | Status: DC | PRN

## 2017-05-07 MED ORDER — ACETAMINOPHEN 325 MG PO TABS
650.0000 mg | ORAL_TABLET | ORAL | Status: DC | PRN
  Administered 2017-05-07 – 2017-05-08 (×2): 650 mg via ORAL
  Administered 2017-05-08: 325 mg via ORAL
  Filled 2017-05-07 (×3): qty 2

## 2017-05-07 MED ORDER — ENOXAPARIN SODIUM 40 MG/0.4ML ~~LOC~~ SOLN
40.0000 mg | SUBCUTANEOUS | Status: DC
  Administered 2017-05-07 – 2017-05-08 (×2): 40 mg via SUBCUTANEOUS
  Filled 2017-05-07 (×2): qty 0.4

## 2017-05-07 MED ORDER — TRAMADOL HCL 50 MG PO TABS: 50.0000 mg | ORAL_TABLET | Freq: Four times a day (QID) | ORAL | Status: DC | PRN

## 2017-05-07 MED ORDER — TRAMADOL HCL 50 MG PO TABS
50.0000 mg | ORAL_TABLET | Freq: Four times a day (QID) | ORAL | Status: DC | PRN
  Administered 2017-05-07 – 2017-05-08 (×2): 50 mg via ORAL
  Filled 2017-05-07 (×2): qty 1

## 2017-05-07 MED ORDER — AMLODIPINE BESYLATE 5 MG PO TABS
5.0000 mg | ORAL_TABLET | Freq: Every day | ORAL | Status: DC
  Administered 2017-05-07: 5 mg via ORAL
  Filled 2017-05-07: qty 1

## 2017-05-07 NOTE — Progress Notes (Addendum)
Pt is becoming anxious about the pain he is having, states its getting worse. He was given 5mg  Oxy 2 hrs ago when he could have up to 10mg  Q 4 hs so 5mg  was given. The Mylicon helped briefly decrease the gas pain. Pt had been drinking a lot of beverages and states he thought since the surgery removed the stones he should have no pain. Much time spent educating pt on laproscopic surgery, gallstones, gas pains, pain meds. IV team restarted IV access so pt can have IV morphine as needed. Encouraged to back off on drinks and restart ice chips until pain better controlled and bowel sounds become more active.

## 2017-05-07 NOTE — Discharge Instructions (Signed)

## 2017-05-07 NOTE — Care Management Important Message (Signed)
Important Message  Patient Details  Name: Dorita SciaraRobert Varnell MRN: [id number] Date of Birth: 03/23/1945   Medicare Important Message Given:  Yes    Caren MacadamFuller, Joven Mom 05/07/2017, 11:42 AMImportant Message  Patient Details  Name: Dorita SciaraRobert Bigler MRN: [id number] Date of Birth: 03/19/1945   Medicare Important Message Given:  Yes    Caren MacadamFuller, Altonio Schwertner 05/07/2017, 11:42 AM

## 2017-05-07 NOTE — Progress Notes (Addendum)
PROGRESS NOTE    Tlaloc Taddei  AVW:[id number] DOB: [birth year] DOA: 05/02/2017 PCP: Patient, No Pcp Per    Brief Narrative:  Nathan Baxter is a 72 y.o. male with medical history significant of HTN, hyperlipidemia, and prediabetes; who presents with complaints of abdominal pain.  Patient reports having acute onset of generalized crampy abdominal pain. Upon admission into the emergency department patient was noted to be afebrile, pulse in the 70s, blood pressure 138/77-, O2 saturations 200/110, and O2 saturations 89-100% on room air.  Labs reviewed significant for RBC 11.7, glucose 252,  lipase 3890, AST 469, ALT 272, and total bilirubin 2.  Urinalysis is negative for any acute abnormalities.  CT scan of the abdomen showed focal wall thickening and inflammatory changes along the posterior wall of the gastric body reported to be concerning for peptic ulcer and 3.3 cm infrarenal abdominal aortic aneurysm. He was admitted for acute pancreatitis and evaluation of PUD.       Assessment & Plan:   Principal Problem:   Gallstone pancreatitis Active Problems:   Leukocytosis   Hypertensive urgency   Abdominal aortic aneurysm (AAA) (HCC)   Hyperglycemia   Chronic cholecystitis with calculus   Acute Pancreatitis of unclear etiology, possibly Gall stone pancreatitis.  Improving abdominal pain and nausea, but not back to baseline.  MRCP shows cholelithiasis , no acute cholecystitis, inflammation of the head of the pancrease suggestive of acute pancreatitis.  Improving lipase and liver enzymes. Appreciate GI recommendations. Surgery consulted for cholecystectomy, . He underwent s/p laparoscopic cholecystectomy 3/4 Dr. Donell Beers. Today we started him on clears and advance diet slowly as tolerated.  Pain control with tylenol, tramadol and add ibuprofen if needed. Pt refusing narcotic pain meds at this time.    Hypertensive urgency:  Resolved. Resume home meds    Hypokalemia:  Replaced, repeat in am.     Hyperglycemia:  Pt is prediabetic.  hgba1c is 6.3.  Resume SSI.  CBG (last 3)  Recent Labs    05/06/17 0004 05/06/17 0605 05/06/17 1327  GLUCAP 121* 134* 125*      Leukocytosis;  Probably reactive/ pancreatitis. UA IS NEGATIVE.     AAA:   Incidental finding on CT scan noted to be 3.3 cm.   -  Recommend outpatient follow-up and ultrasound per guidelines      DVT prophylaxis: lovenox.  Code Status: FULL CODE.  Family Communication: NONE AT BEDSIDE.  Disposition Plan: possible d/c home once pain is better and he is able to tolerate soft diet.    Consultants:   Gastroenterology.   Surgery.    Procedures: MRCP   Cholecystectomy.    Antimicrobials:none.    Subjective: Nausea and abd pain are better.  Pain he rates it at 5/10, without pain meds.    Objective: Vitals:   05/06/17 1731 05/06/17 1824 05/06/17 2237 05/07/17 0353  BP: (!) 159/88 (!) 154/70 (!) 170/86 (!) 171/86  Pulse: 88 90 76 76  Resp: 18 20 20 20   Temp: 97.8 F (36.6 C) 98.2 F (36.8 C) 98.5 F (36.9 C) 98.6 F (37 C)  TempSrc: Oral Axillary Oral Oral  SpO2: 96% 95% 97% 97%  Weight:      Height:        Intake/Output Summary (Last 24 hours) at 05/07/2017 1255 Last data filed at 05/07/2017 0814 Gross per 24 hour  Intake 1155 ml  Output -  Net 1155 ml   Filed Weights   05/02/17 1058  Weight: 110.7 kg (244 lb)  Examination:  General exam: Appears uncomfortable. Not in distress.  Respiratory system: good air entry bilateral. No wheezing or rhonchi or crackles  Cardiovascular system: S1 & S2 heard. RRR.  No murmer.  Gastrointestinal system: abd is soft , mild gen tenderness, more in the lower quadrant. Bowel sounds are good.  Central nervous system: Alert and oriented. No focal neurological deficits. Extremities: Symmetric 5 x 5 power.no pedal edema.  Skin: No rashes, lesions or ulcers Psychiatry:  Mood & affect appropriate.     Data Reviewed: I have personally  reviewed following labs and imaging studies  CBC: Recent Labs  Lab 05/02/17 1114 05/03/17 0539 05/05/17 0543 05/07/17 0720  WBC 11.7* 14.9* 13.9* 14.7*  HGB 15.7 14.2 12.7* 13.0  HCT 45.9 42.3 38.6* 38.1*  MCV 85.0 87.6 88.5 86.0  PLT 284 269 215 341   Basic Metabolic Panel: Recent Labs  Lab 05/02/17 1114 05/03/17 0539 05/04/17 1006 05/07/17 0720  NA 136 141 140 139  K 4.2 3.7 3.5 3.3*  CL 100* 106 108 105  CO2 23 26 24 23   GLUCOSE 252* 144* 147* 172*  BUN 15 14 16 16   CREATININE 0.92 0.77 0.78 0.61  CALCIUM 9.2 8.6* 8.2* 8.5*   GFR: Estimated Creatinine Clearance: 107.2 mL/min (by C-G formula based on SCr of 0.61 mg/dL). Liver Function Tests: Recent Labs  Lab 05/02/17 1114 05/03/17 0539 05/04/17 1006 05/06/17 0635 05/07/17 0720  AST 469* 137* 71* 56* 52*  ALT 272* 172* 92* 102* 89*  ALKPHOS 72 68 59 71 71  BILITOT 2.0* 1.9* 1.9* 2.3* 1.8*  PROT 7.8 6.4* 5.9* 5.9* 6.2*  ALBUMIN 4.3 3.7 3.2* 2.7* 2.7*   Recent Labs  Lab 05/02/17 1114 05/03/17 0539 05/06/17 0635  LIPASE 3,890* 305* 29   No results for input(s): AMMONIA in the last 168 hours. Coagulation Profile: No results for input(s): INR, PROTIME in the last 168 hours. Cardiac Enzymes: No results for input(s): CKTOTAL, CKMB, CKMBINDEX, TROPONINI in the last 168 hours. BNP (last 3 results) No results for input(s): PROBNP in the last 8760 hours. HbA1C: No results for input(s): HGBA1C in the last 72 hours. CBG: Recent Labs  Lab 05/05/17 1221 05/05/17 1801 05/06/17 0004 05/06/17 0605 05/06/17 1327  GLUCAP 128* 129* 121* 134* 125*   Lipid Profile: No results for input(s): CHOL, HDL, LDLCALC, TRIG, CHOLHDL, LDLDIRECT in the last 72 hours. Thyroid Function Tests: No results for input(s): TSH, T4TOTAL, FREET4, T3FREE, THYROIDAB in the last 72 hours. Anemia Panel: No results for input(s): VITAMINB12, FOLATE, FERRITIN, TIBC, IRON, RETICCTPCT in the last 72 hours. Sepsis Labs: No results for  input(s): PROCALCITON, LATICACIDVEN in the last 168 hours.  Recent Results (from the past 240 hour(s))  Surgical pcr screen     Status: None   Collection Time: 05/05/17 10:03 AM  Result Value Ref Range Status   MRSA, PCR NEGATIVE NEGATIVE Final   Staphylococcus aureus NEGATIVE NEGATIVE Final    Comment: (NOTE) The Xpert SA Assay (FDA approved for NASAL specimens in patients 53 years of age and older), is one component of a comprehensive surveillance program. It is not intended to diagnose infection nor to guide or monitor treatment. Performed at [facility], [address], [place], [place] [address]          Radiology Studies: No results found.      Scheduled Meds: . amLODipine  5 mg Oral Daily  . enoxaparin (LOVENOX) injection  40 mg Subcutaneous Q24H   Continuous Infusions:  LOS: 5 days    Time spent: 35 min    Kathlen ModyVijaya Zamariah Seaborn, MD [facility] Pager [phone] If 7PM-7AM, please contact night-coverage [email or web address] Password [email or web address] 05/07/2017, 12:55 PM

## 2017-05-07 NOTE — Progress Notes (Signed)
[facility] Progress Note  1 Day Post-Op  Subjective: CC- pain Patient states that he felt great immediately after surgery and over did things; he ate and ambulated too much, then had severe abdominal pain last night. Feeling a little better this morning. Continues to have some right sided abdominal pain and bloating. He has been ambulating. Passing flatus. Tolerating few full liquids. Tried oxycodone but this made him feel lightheaded/confused so he is trying to avoid narcotics.  Objective: Vital signs in last 24 hours: Temp:  [97.8 F (36.6 C)-99.8 F (37.7 C)] 98.6 F (37 C) (03/05 0353) Pulse Rate:  [70-97] 76 (03/05 0353) Resp:  [15-25] 20 (03/05 0353) BP: (152-196)/(70-102) 171/86 (03/05 0353) SpO2:  [93 %-98 %] 97 % (03/05 0353) Last BM Date: 05/05/17  Intake/Output from previous day: 03/04 0701 - 03/05 0700 In: 1595 [P.O.:480; I.V.:1015; IV Piggyback:100] Out: 25 [Blood:25] Intake/Output this shift: Total I/O In: 360 [P.O.:360] Out: -   PE: Gen:  Alert, NAD HEENT: EOM's intact, pupils equal and round Pulm:  effort normal Abd: Soft, distended, +BS, no HSM, no hernia, lap incisions C/D/I, mild right sided abdominal TTP without rebound or guarding Psych: A&Ox3  Skin: no rashes noted, warm and dry  Lab Results:  Recent Labs    05/05/17 0543 05/07/17 0720  WBC 13.9* 14.7*  HGB 12.7* 13.0  HCT 38.6* 38.1*  PLT 215 341   BMET Recent Labs    05/07/17 0720  NA 139  K 3.3*  CL 105  CO2 23  GLUCOSE 172*  BUN 16  CREATININE 0.61  CALCIUM 8.5*   PT/INR No results for input(s): LABPROT, INR in the last 72 hours. CMP     Component Value Date/Time   NA 139 05/07/2017 0720   K 3.3 (L) 05/07/2017 0720   CL 105 05/07/2017 0720   CO2 23 05/07/2017 0720   GLUCOSE 172 (H) 05/07/2017 0720   BUN 16 05/07/2017 0720   CREATININE 0.61 05/07/2017 0720   CALCIUM 8.5 (L) 05/07/2017 0720   PROT 6.2 (L) 05/07/2017 0720   ALBUMIN 2.7 (L) 05/07/2017 0720    AST 52 (H) 05/07/2017 0720   ALT 89 (H) 05/07/2017 0720   ALKPHOS 71 05/07/2017 0720   BILITOT 1.8 (H) 05/07/2017 0720   GFRNONAA >60 05/07/2017 0720   GFRAA >60 05/07/2017 0720   Lipase     Component Value Date/Time   LIPASE 29 05/06/2017 0635       Studies/Results: No results found.  Anti-infectives: Anti-infectives (From admission, onward)   Start     Dose/Rate Route Frequency Ordered Stop   05/06/17 0600  cefTRIAXone (ROCEPHIN) 2 g in sodium chloride 0.9 % 100 mL IVPB     2 g 200 mL/hr over 30 Minutes Intravenous On call to O.R. 05/05/17 0953 05/06/17 1150   05/06/17 0600  metroNIDAZOLE (FLAGYL) IVPB 500 mg     500 mg 100 mL/hr over 60 Minutes Intravenous On call to O.R. 05/05/17 4540 05/06/17 1211       Assessment/Plan Pancreatitis  HTN AAA  Acute calculous cholecystitis S/p laparoscopic cholecystectomy 3/4 Dr. Donell Beers - POD 1 - LFTs trending down, bilirubin down 1.8 from 2.3  ID - rocephin/flagyl perioperative FEN - IVF, FLD VTE - SCDs, lovenox Foley - none Follow up - [facility]  Plan - Add NSAID for better pain control. Continue ambulating. Advance diet as tolerated. CMP in AM. Likely ready for discharge tomorrow.   LOS: 5 days    Franne Forts ,  [facility] 05/07/2017, 11:01 AM Pager: [phone] Consults: [phone] Mon-Fri 7:00 am-4:30 pm Sat-Sun 7:00 am-11:30 am

## 2017-05-08 DIAGNOSIS — K851 Biliary acute pancreatitis without necrosis or infection: Principal | ICD-10-CM

## 2017-05-08 DIAGNOSIS — K810 Acute cholecystitis: Secondary | ICD-10-CM | POA: Diagnosis present

## 2017-05-08 DIAGNOSIS — E876 Hypokalemia: Secondary | ICD-10-CM | POA: Clinically undetermined

## 2017-05-08 LAB — COMPREHENSIVE METABOLIC PANEL
ALT: 79 U/L — ABNORMAL HIGH (ref 17–63)
ANION GAP: 12 (ref 5–15)
AST: 49 U/L — ABNORMAL HIGH (ref 15–41)
Albumin: 2.7 g/dL — ABNORMAL LOW (ref 3.5–5.0)
Alkaline Phosphatase: 79 U/L (ref 38–126)
BUN: 17 mg/dL (ref 6–20)
CO2: 24 mmol/L (ref 22–32)
Calcium: 8.5 mg/dL — ABNORMAL LOW (ref 8.9–10.3)
Chloride: 106 mmol/L (ref 101–111)
Creatinine, Ser: 0.59 mg/dL — ABNORMAL LOW (ref 0.61–1.24)
Glucose, Bld: 141 mg/dL — ABNORMAL HIGH (ref 65–99)
POTASSIUM: 3.1 mmol/L — AB (ref 3.5–5.1)
Sodium: 142 mmol/L (ref 135–145)
Total Bilirubin: 1.8 mg/dL — ABNORMAL HIGH (ref 0.3–1.2)
Total Protein: 6.1 g/dL — ABNORMAL LOW (ref 6.5–8.1)

## 2017-05-08 LAB — MAGNESIUM: MAGNESIUM: 2.1 mg/dL (ref 1.7–2.4)

## 2017-05-08 MED ORDER — METOPROLOL TARTRATE 25 MG PO TABS: 12.5000 mg | ORAL_TABLET | Freq: Two times a day (BID) | ORAL | 0 refills | Status: DC

## 2017-05-08 MED ORDER — POTASSIUM CHLORIDE CRYS ER 20 MEQ PO TBCR
40.0000 meq | EXTENDED_RELEASE_TABLET | ORAL | Status: AC
  Administered 2017-05-08 (×2): 40 meq via ORAL
  Filled 2017-05-08 (×2): qty 2

## 2017-05-08 MED ORDER — AMLODIPINE BESYLATE 10 MG PO TABS
10.0000 mg | ORAL_TABLET | Freq: Every day | ORAL | Status: DC
  Administered 2017-05-08: 10 mg via ORAL
  Filled 2017-05-08: qty 1

## 2017-05-08 MED ORDER — METOPROLOL TARTRATE 12.5 MG HALF TABLET
12.5000 mg | ORAL_TABLET | Freq: Two times a day (BID) | ORAL | Status: DC
  Administered 2017-05-08: 12.5 mg via ORAL
  Filled 2017-05-08: qty 1

## 2017-05-08 MED ORDER — TRAMADOL HCL 50 MG PO TABS: 50.0000 mg | ORAL_TABLET | Freq: Four times a day (QID) | ORAL | 0 refills | Status: DC | PRN

## 2017-05-08 MED ORDER — SIMETHICONE 80 MG PO CHEW: 160.0000 mg | CHEWABLE_TABLET | Freq: Four times a day (QID) | ORAL | 0 refills | Status: DC | PRN

## 2017-05-08 MED ORDER — AMLODIPINE BESYLATE 10 MG PO TABS: 10.0000 mg | ORAL_TABLET | Freq: Every day | ORAL | 0 refills | Status: AC

## 2017-05-08 NOTE — Progress Notes (Signed)
[facility] Progress Note  2 Days Post-Op  Subjective: CC-  Patient reports a bout of increased global abdominal pain last night. He was diaphoretic. States that tylenol, simethicone, and time helped. He is feeling better this morning. Denies n/v. He is passing some flatus but still feels a little bloated. Last BM was 1 week ago. He does not want to take anything for constipation here, he wants to wait until he gets home.  Objective: Vital signs in last 24 hours: Temp:  [97.5 F (36.4 C)-98.6 F (37 C)] 97.5 F (36.4 C) (03/06 0310) Pulse Rate:  [70-79] 70 (03/06 0310) Resp:  [17-18] 18 (03/06 0310) BP: (159-175)/(86-95) 171/95 (03/06 0310) SpO2:  [97 %-98 %] 98 % (03/06 0310) Last BM Date: 05/05/17  Intake/Output from previous day: 03/05 0701 - 03/06 0700 In: 1200 [P.O.:1200] Out: -  Intake/Output this shift: No intake/output data recorded.  PE: Gen:  Alert, NAD HEENT: EOM's intact, pupils equal and round Pulm:  effort normal Abd: Soft, mild distension, +BS, no HSM, no hernia, lap incisions C/D/I, nontender Psych: A&Ox3  Skin: no rashes noted, warm and dry Lab Results:  Recent Labs    05/07/17 0720  WBC 14.7*  HGB 13.0  HCT 38.1*  PLT 341   BMET Recent Labs    05/07/17 0720 05/08/17 0601  NA 139 142  K 3.3* 3.1*  CL 105 106  CO2 23 24  GLUCOSE 172* 141*  BUN 16 17  CREATININE 0.61 0.59*  CALCIUM 8.5* 8.5*   PT/INR No results for input(s): LABPROT, INR in the last 72 hours. CMP     Component Value Date/Time   NA 142 05/08/2017 0601   K 3.1 (L) 05/08/2017 0601   CL 106 05/08/2017 0601   CO2 24 05/08/2017 0601   GLUCOSE 141 (H) 05/08/2017 0601   BUN 17 05/08/2017 0601   CREATININE 0.59 (L) 05/08/2017 0601   CALCIUM 8.5 (L) 05/08/2017 0601   PROT 6.1 (L) 05/08/2017 0601   ALBUMIN 2.7 (L) 05/08/2017 0601   AST 49 (H) 05/08/2017 0601   ALT 79 (H) 05/08/2017 0601   ALKPHOS 79 05/08/2017 0601   BILITOT 1.8 (H) 05/08/2017 0601   GFRNONAA >60 05/08/2017 0601   GFRAA >60 05/08/2017 0601   Lipase     Component Value Date/Time   LIPASE 29 05/06/2017 0635       Studies/Results: No results found.  Anti-infectives: Anti-infectives (From admission, onward)   Start     Dose/Rate Route Frequency Ordered Stop   05/06/17 0600  cefTRIAXone (ROCEPHIN) 2 g in sodium chloride 0.9 % 100 mL IVPB     2 g 200 mL/hr over 30 Minutes Intravenous On call to O.R. 05/05/17 0953 05/06/17 1150   05/06/17 0600  metroNIDAZOLE (FLAGYL) IVPB 500 mg     500 mg 100 mL/hr over 60 Minutes Intravenous On call to O.R. 05/05/17 0953 05/06/17 1211       Assessment/Plan Pancreatitis  HTN AAA  Acute calculous cholecystitis S/p laparoscopic cholecystectomy 3/4 Dr. Donell BeersByerly - POD 2 - LFTs trending down, bilirubin stable at 1.8  ID - rocephin/flagyl perioperative FEN - soft diet VTE - SCDs, lovenox Foley - none Follow up - [facility]  Plan - Patient stable for discharge from surgical standpoint. Plan to repeat CMP in about 1 week and follow up in our office. Discharge instructions and follow up appointment on AVS.   LOS: 6 days    Franne FortsBrooke A Sharronda Schweers , [facility] 05/08/2017,  9:22 AM Pager: [phone] Consults: [phone] Mon-Fri 7:00 am-4:30 pm Sat-Sun 7:00 am-11:30 am

## 2017-05-08 NOTE — Discharge Summary (Signed)
Physician Discharge Summary  Nathan Baxter UJW:[id number] DOB: October 15, 1945 DOA: 05/02/2017  PCP: Patient, No Pcp Per  Admit date: 05/02/2017 Discharge date: 05/08/2017  Time spent: 60 minutes  Recommendations for Outpatient Follow-up:  1. Follow-up with general surgery 05/16/2017 2. Follow-up with PCP in 1-2 weeks.  On follow-up patient's blood pressure will need to be reassessed.  Patient will need a basic metabolic profile done to follow-up on electrolytes and renal function.  Incidental AAA noted during this hospitalization and patient will need abdominal ultrasound done in 3 years for follow-up.   Discharge Diagnoses:  Principal Problem:   Gallstone pancreatitis Active Problems:   Acute calculous cholecystitis   Leukocytosis   Hypertensive urgency   Abdominal aortic aneurysm (AAA) (HCC)   Hyperglycemia   Hypokalemia   Discharge Condition: Stable and improved  Diet recommendation: Heart healthy  Filed Weights   05/02/17 1058  Weight: 110.7 kg (244 lb)    History of present illness:  Per Nathan Baxter is a 72 y.o. male with medical history significant of HTN, hyperlipidemia, and prediabetes; who presented with complaints of abdominal pain.  Patient reported having acute onset of generalized crampy abdominal pain early the morning of admission, around 1 AM.  Rated pain as a 10 out of 10 on the pain scale.  Thereafter he reported having 4-5 episodes of loose diarrhea and 3-4 episodes of nonbloody vomiting.  Associated symptoms included feeling clammy, belching, insomnia, and short of breath due to pain.  Nothing helped relieve symptoms.  He had never had symptoms like this past.  Denied any chest pain, recent sick contacts, significant alcohol use, NSAID use, or dysuria.   Patient previously lived in [place] and reported having issues with hypertension for which he was on lisinopril and hydrochlorothiazide, and prediabetes treated with metformin.  He reported being off blood  pressure medications for couple years now since moving to [place] as he did not have a PCP.  Reported that he self stopped metformin as it gave him significant diarrhea.  Currently he takes a aspirin and over-the-counter medications for cholesterol.  ED Course: Upon admission into the emergency department patient was noted to be afebrile, pulse in the 70s, blood pressure 138/77-, O2 saturations 200/110, and O2 saturations 89-100% on room air.  Labs reviewed significant for RBC 11.7, glucose 252,  lipase 3890, AST 469, ALT 272, and total bilirubin 2.  Urinalysis is negative for any acute abnormalities.  CT scan of the abdomen showed focal wall thickening and inflammatory changes along the posterior wall of the gastric body reported to be concerning for peptic ulcer and 3.3 cm infrarenal abdominal aortic aneurysm.  Patient was started on normal saline at 125 mL/h, Pepcid IV, antiemetics, and pain medication.  Accepted to a MedSurg bed at [facility].     Hospital Course:  #1 acute pancreatitis likely gallstone pancreatitis Patient was admitted with an acute pancreatitis episode felt secondary to a gallstone pancreatitis.  Abdominal ultrasound which was done showed a poorly visualized pancreas secondary to overlying bowel gas, no acute findings.  Infrarenal abdominal aortic aneurysm measuring 3.1 cm in diameter.  Follow-up ultrasound in 3 years.  Patient underwent CT abdomen and pelvis on admission that showed posterior wall thickening and other inflammatory changes seen along posterior wall of the gastric body concerning for possible peptic ulcer disease.  Small amount of fluid seen posterior to the second portion of the duodenum.  Lipase on admission was elevated at 3890.  She was placed  on bowel rest, IV fluids, supportive care.  GI was consulted and patient underwent a MRCP which was negative for any signs of biliary obstruction.  Cholelithiasis without evidence of acute cholecystitis.   Inflammatory changes adjacent to the head and proximal body of the pancreas concerning for acute pancreatitis.  Moderate right pleural effusion also noted.  General surgical consultation was obtained and patient subsequently underwent laparoscopic cholecystectomy 05/06/2017 with no complications by Dr. Donell Beers.  Patient was subsequently started on clear liquids which he tolerated and diet was advanced to a soft diet.  Patient did not have any further abdominal pain.  Patient was discharged home in stable and improved condition.  Patient is to follow-up with general surgery in the outpatient setting.  2.  Hypertensive urgency Resolved with resumption of home medications.  3.  Hypokalemia Repleted.  4.  Hyperglycemia Patient noted to have a hemoglobin A1c of 6.3.  Patient was maintained on sliding scale insulin.  Outpatient follow-up.  5.  Leukocytosis Felt to be likely a reactive leukocytosis.  Infectious workup was negative.  Outpatient follow-up.  6.  AAA Incidental finding noted on CT abdomen and pelvis which was 3.1 cm in diameter.  Outpatient follow-up with abdominal ultrasound in 3 years.  Procedures:  Laparoscopic cholecystectomy per Dr. Donell Beers 05/06/2017  CT abdomen and pelvis 05/02/2017  MRCP 05/04/2017  Abdominal ultrasound 05/03/2017  Consultations:  Gastroenterology: Dr. Lavon Paganini 05/03/2017  General surgery: Dr. Michaell Cowing 05/05/2017  Discharge Exam: Vitals:   05/07/17 2308 05/08/17 0310  BP: (!) 169/88 (!) 171/95  Pulse: 73 70  Resp: 18 18  Temp: 98.6 F (37 C) (!) 97.5 F (36.4 C)  SpO2: 97% 98%    General: NAD Cardiovascular: RRR Respiratory: CTAB  Discharge Instructions   Discharge Instructions    Diet - low sodium heart healthy   Complete by:  As directed    Increase activity slowly   Complete by:  As directed      Allergies as of 05/08/2017      Reactions   Sulfa Antibiotics       Medication List    TAKE these medications   amLODipine 10 MG  tablet Commonly known as:  NORVASC Take 1 tablet (10 mg total) by mouth daily. Start taking on:  05/09/2017   aspirin EC 81 MG tablet Take 81 mg by mouth daily.   cyanocobalamin 500 MCG tablet Take 500 mcg by mouth daily.   Fish Oil 1000 MG Cpdr Take by mouth.   folic acid 1 MG tablet Commonly known as:  FOLVITE Take 1 mg by mouth daily.   metoprolol tartrate 25 MG tablet Commonly known as:  LOPRESSOR Take 0.5 tablets (12.5 mg total) by mouth 2 (two) times daily.   multivitamin tablet Take 1 tablet by mouth daily.   simethicone 80 MG chewable tablet Commonly known as:  MYLICON Chew 2 tablets (160 mg total) by mouth 4 (four) times daily as needed for flatulence.   traMADol 50 MG tablet Commonly known as:  ULTRAM Take 1 tablet (50 mg total) by mouth every 6 (six) hours as needed for moderate pain.      Allergies  Allergen Reactions  . Sulfa Antibiotics    Follow-up Information    [facility], [place]. Go on 05/16/2017.   Specialty:  General Surgery Why:  Your appointment is 05/16/17 at 2:45PM. Please go to [facility] lab the day before your appointment for blood work (CMP). Please arrive 30 minutes prior to your appointment to check in  and fill out paperwork. Bring photo ID and insurance information. Contact information: [address] [place] [id number] [phone]       PCP. Schedule an appointment as soon as possible for a visit in 1 week(s).   Why:  f/u in 1-2 weeks.           The results of significant diagnostics from this hospitalization (including imaging, microbiology, ancillary and laboratory) are listed below for reference.    Significant Diagnostic Studies: [place] Abdomen Complete  Result Date: 05/03/2017 CLINICAL DATA:  72 year old male with history of pancreatitis. EXAM: ABDOMEN ULTRASOUND COMPLETE COMPARISON:  No prior abdominal ultrasound. CT the abdomen and pelvis 05/02/2017. FINDINGS: Gallbladder: No  gallstones or wall thickening visualized. No sonographic Murphy sign noted by sonographer. Common bile duct: Diameter: 4.8 mm Liver: No focal lesion identified. Within normal limits in parenchymal echogenicity. Portal vein is patent on color Doppler imaging with normal direction of blood flow towards the liver. IVC: No abnormality visualized. Pancreas: Poorly visualized. Spleen: Size and appearance within normal limits. Right Kidney: Length: 12.1 cm. Echogenicity within normal limits. No mass or hydronephrosis visualized. Left Kidney: Length: 12.2 cm. Echogenicity within normal limits. No mass or hydronephrosis visualized. Abdominal aorta: Fusiform dilatation of the infrarenal abdominal aorta measuring up to 3.1 cm in diameter. Other findings: None. IMPRESSION: 1. Pancreas is poorly visualized on today's examination secondary to overlying bowel gas. 2. No acute findings. 3. Small infrarenal abdominal aortic aneurysm measuring 3.1 cm in diameter. Recommend followup by ultrasound in 3 years. This recommendation follows ACR consensus guidelines: White Paper of the ACR Incidental Findings Committee II on Vascular Findings. J Am Coll Radiol 2013; 10:789-794. Electronically Signed   By: Trudie Reedaniel  Entrikin M.D.   On: 05/03/2017 15:38   Ct Abdomen Pelvis W Contrast  Result Date: 05/02/2017 CLINICAL DATA:  Acute generalized abdominal pain. EXAM: CT ABDOMEN AND PELVIS WITH CONTRAST TECHNIQUE: Multidetector CT imaging of the abdomen and pelvis was performed using the standard protocol following bolus administration of intravenous contrast. CONTRAST:  100mL ISOVUE-300 IOPAMIDOL (ISOVUE-300) INJECTION 61% COMPARISON:  None. FINDINGS: Lower chest: No acute abnormality. Hepatobiliary: No focal liver abnormality is seen. No gallstones, gallbladder wall thickening, or biliary dilatation. Pancreas: Unremarkable. No pancreatic ductal dilatation or surrounding inflammatory changes. Spleen: Normal in size without focal abnormality.  Adrenals/Urinary Tract: Adrenal glands are unremarkable. Kidneys are normal, without renal calculi, focal lesion, or hydronephrosis. Bladder is unremarkable. Stomach/Bowel: The appendix appears normal. There is no evidence of bowel obstruction. Focal wall thickening and inflammatory changes are seen involving the posterior wall of the gastric body, which may represent peptic ulcer disease. Small amount of fluid is also seen posterior to second portion of duodenum. Vascular/Lymphatic: 3.3 cm infrarenal abdominal aortic aneurysm is noted. Atherosclerosis of abdominal aorta is noted. No adenopathy is noted. Reproductive: Prostate is unremarkable. Other: Small fat containing periumbilical hernia is noted. Musculoskeletal: No acute or significant osseous findings. IMPRESSION: Focal wall thickening and other inflammatory changes are seen along the posterior wall of gastric body concerning for possible peptic ulcer disease. Small amount of fluid is seen posterior to the second portion of duodenum which may be related to peptic ulcer disease is well. 3.3 cm infrarenal abdominal aortic aneurysm. Recommend followup by ultrasound in 3 years. This recommendation follows ACR consensus guidelines: White Paper of the ACR Incidental Findings Committee II on Vascular Findings. J Am Coll Radiol 2013; 10:789-794. Aortic Atherosclerosis (ICD10-I70.0). Electronically Signed   By: Lupita RaiderJames  Green Jr, M.D.  On: 05/02/2017 13:20   Mr Abdomen Mrcp Wo Contrast  Result Date: 05/04/2017 CLINICAL DATA:  72 year old male with history of abnormal liver function tests. EXAM: MRI ABDOMEN WITHOUT CONTRAST  (INCLUDING MRCP) TECHNIQUE: Multiplanar multisequence MR imaging of the abdomen was performed. Heavily T2-weighted images of the biliary and pancreatic ducts were obtained, and three-dimensional MRCP images were rendered by post processing. COMPARISON:  No prior abdominal MRI. CT the abdomen and pelvis 05/02/2017. FINDINGS: Lower chest: Moderate  right pleural effusion lying dependently. Increased signal intensity in portions of the right lower lobe dependently. Hepatobiliary: Diffuse loss of signal intensity throughout the hepatic parenchyma, indicative of hepatic steatosis. No suspicious cystic or solid hepatic lesions noted on today's noncontrast examination. No intra or extrahepatic biliary ductal dilatation on MRCP images. Common bile duct measures 6 mm in the porta hepatis. No filling defects along the common bile duct to suggest choledocholithiasis. Several tiny filling defects within the gallbladder, compatible with small gallstones. No surrounding inflammatory changes to suggest an acute cholecystitis at this time. Pancreas: No definite pancreatic mass appreciated on today's noncontrast examination. Increased T2 signal intensity adjacent to the head and proximal body of the pancreas, concerning for an acute pancreatitis. No pancreatic ductal dilatation noted on MRCP images. No well-defined peripancreatic fluid collections are noted. Spleen:  Unremarkable. Adrenals/Urinary Tract: Unenhanced appearance of the kidneys and bilateral adrenal glands is unremarkable. Stomach/Bowel: No hydroureteronephrosis in the visualized portions of the abdomen. Vascular/Lymphatic: Fusiform aneurysmal dilatation of the infrarenal abdominal aorta measuring 2.9 x 3.5 cm. No lymphadenopathy noted in the abdomen. Other: Increased fluid signal intensity throughout the right side of the retroperitoneum, presumably related to acute pancreatitis. Trace volume of ascites in the right side of the abdomen. Musculoskeletal: No aggressive appearing osseous lesions are noted in the visualized portions of the skeleton. IMPRESSION: 1. No signs of biliary tract obstruction. 2. Cholelithiasis without evidence of acute cholecystitis at this time. 3. Inflammatory changes adjacent to the head and proximal body of the pancreas concerning for an acute pancreatitis. 4. Moderate right pleural  effusion lying dependently with some associated passive subsegmental atelectasis in the right lower lobe. 5. Hepatic steatosis. Electronically Signed   By: Trudie Reed M.D.   On: 05/04/2017 12:19   Mr 3d Recon At Scanner  Result Date: 05/04/2017 CLINICAL DATA:  72 year old male with history of abnormal liver function tests. EXAM: MRI ABDOMEN WITHOUT CONTRAST  (INCLUDING MRCP) TECHNIQUE: Multiplanar multisequence MR imaging of the abdomen was performed. Heavily T2-weighted images of the biliary and pancreatic ducts were obtained, and three-dimensional MRCP images were rendered by post processing. COMPARISON:  No prior abdominal MRI. CT the abdomen and pelvis 05/02/2017. FINDINGS: Lower chest: Moderate right pleural effusion lying dependently. Increased signal intensity in portions of the right lower lobe dependently. Hepatobiliary: Diffuse loss of signal intensity throughout the hepatic parenchyma, indicative of hepatic steatosis. No suspicious cystic or solid hepatic lesions noted on today's noncontrast examination. No intra or extrahepatic biliary ductal dilatation on MRCP images. Common bile duct measures 6 mm in the porta hepatis. No filling defects along the common bile duct to suggest choledocholithiasis. Several tiny filling defects within the gallbladder, compatible with small gallstones. No surrounding inflammatory changes to suggest an acute cholecystitis at this time. Pancreas: No definite pancreatic mass appreciated on today's noncontrast examination. Increased T2 signal intensity adjacent to the head and proximal body of the pancreas, concerning for an acute pancreatitis. No pancreatic ductal dilatation noted on MRCP images. No well-defined peripancreatic fluid collections are noted. Spleen:  Unremarkable. Adrenals/Urinary Tract: Unenhanced appearance of the kidneys and bilateral adrenal glands is unremarkable. Stomach/Bowel: No hydroureteronephrosis in the visualized portions of the abdomen.  Vascular/Lymphatic: Fusiform aneurysmal dilatation of the infrarenal abdominal aorta measuring 2.9 x 3.5 cm. No lymphadenopathy noted in the abdomen. Other: Increased fluid signal intensity throughout the right side of the retroperitoneum, presumably related to acute pancreatitis. Trace volume of ascites in the right side of the abdomen. Musculoskeletal: No aggressive appearing osseous lesions are noted in the visualized portions of the skeleton. IMPRESSION: 1. No signs of biliary tract obstruction. 2. Cholelithiasis without evidence of acute cholecystitis at this time. 3. Inflammatory changes adjacent to the head and proximal body of the pancreas concerning for an acute pancreatitis. 4. Moderate right pleural effusion lying dependently with some associated passive subsegmental atelectasis in the right lower lobe. 5. Hepatic steatosis. Electronically Signed   By: Trudie Reed M.D.   On: 05/04/2017 12:19    Microbiology: Recent Results (from the past 240 hour(s))  Surgical pcr screen     Status: None   Collection Time: 05/05/17 10:03 AM  Result Value Ref Range Status   MRSA, PCR NEGATIVE NEGATIVE Final   Staphylococcus aureus NEGATIVE NEGATIVE Final    Comment: (NOTE) The Xpert SA Assay (FDA approved for NASAL specimens in patients 20 years of age and older), is one component of a comprehensive surveillance program. It is not intended to diagnose infection nor to guide or monitor treatment. Performed at [facility], [address] [place], [place] [address]      Labs: Basic Metabolic Panel: Recent Labs  Lab 05/02/17 1114 05/03/17 0539 05/04/17 1006 05/07/17 0720 05/08/17 0601  NA 136 141 140 139 142  K 4.2 3.7 3.5 3.3* 3.1*  CL 100* 106 108 105 106  CO2 23 26 24 23 24   GLUCOSE 252* 144* 147* 172* 141*  BUN 15 14 16 16 17   CREATININE 0.92 0.77 0.78 0.61 0.59*  CALCIUM 9.2 8.6* 8.2* 8.5* 8.5*  MG  --   --   --   --  2.1   Liver Function Tests: Recent Labs   Lab 05/03/17 0539 05/04/17 1006 05/06/17 0635 05/07/17 0720 05/08/17 0601  AST 137* 71* 56* 52* 49*  ALT 172* 92* 102* 89* 79*  ALKPHOS 68 59 71 71 79  BILITOT 1.9* 1.9* 2.3* 1.8* 1.8*  PROT 6.4* 5.9* 5.9* 6.2* 6.1*  ALBUMIN 3.7 3.2* 2.7* 2.7* 2.7*   Recent Labs  Lab 05/02/17 1114 05/03/17 0539 05/06/17 0635  LIPASE 3,890* 305* 29   No results for input(s): AMMONIA in the last 168 hours. CBC: Recent Labs  Lab 05/02/17 1114 05/03/17 0539 05/05/17 0543 05/07/17 0720  WBC 11.7* 14.9* 13.9* 14.7*  HGB 15.7 14.2 12.7* 13.0  HCT 45.9 42.3 38.6* 38.1*  MCV 85.0 87.6 88.5 86.0  PLT 284 269 215 341   Cardiac Enzymes: No results for input(s): CKTOTAL, CKMB, CKMBINDEX, TROPONINI in the last 168 hours. BNP: BNP (last 3 results) No results for input(s): BNP in the last 8760 hours.  ProBNP (last 3 results) No results for input(s): PROBNP in the last 8760 hours.  CBG: Recent Labs  Lab 05/05/17 1221 05/05/17 1801 05/06/17 0004 05/06/17 0605 05/06/17 1327  GLUCAP 128* 129* 121* 134* 125*       Signed:  Ramiro Harvest MD.  [facility] 05/08/2017, 1:23 PM

## 2017-05-13 ENCOUNTER — Telehealth: Payer: Self-pay | Admitting: Nurse Practitioner

## 2017-05-13 NOTE — Telephone Encounter (Signed)
-----   Message from Evalee JeffersonElizabeth A McKew, LPN sent at 4/09/81193/01/2018 10:39 AM EDT -----   ----- Message ----- From: Meredith PelGuenther, Paula M, NP Sent: 05/05/2017  10:04 AM To: Evalee JeffersonElizabeth A McKew, LPN  Beth, will you call patient late next week and give him a hospital follow up with me in 2-3 weeks. Thanks

## 2017-05-15 DIAGNOSIS — Z9049 Acquired absence of other specified parts of digestive tract: Secondary | ICD-10-CM | POA: Diagnosis not present

## 2017-05-16 DIAGNOSIS — Z9049 Acquired absence of other specified parts of digestive tract: Secondary | ICD-10-CM | POA: Diagnosis not present

## 2017-05-17 NOTE — Telephone Encounter (Signed)
Appointment scheduled for 05-30-17.

## 2017-05-21 ENCOUNTER — Inpatient Hospital Stay (HOSPITAL_COMMUNITY)
Admission: EM | Admit: 2017-05-21 | Discharge: 2017-05-30 | DRG: 394 | Disposition: A | Discharge status: Home / Self Care | Payer: Medicare HMO | Attending: Gastroenterology | Admitting: Gastroenterology

## 2017-05-21 ENCOUNTER — Other Ambulatory Visit: Payer: Self-pay

## 2017-05-21 ENCOUNTER — Encounter (HOSPITAL_COMMUNITY): Payer: Self-pay | Admitting: Emergency Medicine

## 2017-05-21 DIAGNOSIS — I714 Abdominal aortic aneurysm, without rupture: Secondary | ICD-10-CM | POA: Diagnosis present

## 2017-05-21 DIAGNOSIS — S37011A Minor contusion of right kidney, initial encounter: Secondary | ICD-10-CM | POA: Diagnosis present

## 2017-05-21 DIAGNOSIS — K9189 Other postprocedural complications and disorders of digestive system: Principal | ICD-10-CM | POA: Diagnosis present

## 2017-05-21 DIAGNOSIS — Z9049 Acquired absence of other specified parts of digestive tract: Secondary | ICD-10-CM | POA: Diagnosis not present

## 2017-05-21 DIAGNOSIS — I7143 Infrarenal abdominal aortic aneurysm, without rupture: Secondary | ICD-10-CM | POA: Diagnosis present

## 2017-05-21 DIAGNOSIS — E860 Dehydration: Secondary | ICD-10-CM | POA: Diagnosis present

## 2017-05-21 DIAGNOSIS — Z881 Allergy status to other antibiotic agents status: Secondary | ICD-10-CM

## 2017-05-21 DIAGNOSIS — K839 Disease of biliary tract, unspecified: Secondary | ICD-10-CM

## 2017-05-21 DIAGNOSIS — R7303 Prediabetes: Secondary | ICD-10-CM | POA: Diagnosis present

## 2017-05-21 DIAGNOSIS — K810 Acute cholecystitis: Secondary | ICD-10-CM | POA: Diagnosis present

## 2017-05-21 DIAGNOSIS — Z7982 Long term (current) use of aspirin: Secondary | ICD-10-CM

## 2017-05-21 DIAGNOSIS — Z4803 Encounter for change or removal of drains: Secondary | ICD-10-CM | POA: Diagnosis not present

## 2017-05-21 DIAGNOSIS — Z79891 Long term (current) use of opiate analgesic: Secondary | ICD-10-CM

## 2017-05-21 DIAGNOSIS — Z8249 Family history of ischemic heart disease and other diseases of the circulatory system: Secondary | ICD-10-CM

## 2017-05-21 DIAGNOSIS — R748 Abnormal levels of other serum enzymes: Secondary | ICD-10-CM | POA: Diagnosis not present

## 2017-05-21 DIAGNOSIS — K838 Other specified diseases of biliary tract: Secondary | ICD-10-CM | POA: Diagnosis not present

## 2017-05-21 DIAGNOSIS — T8143XA Infection following a procedure, organ and space surgical site, initial encounter: Secondary | ICD-10-CM | POA: Diagnosis not present

## 2017-05-21 DIAGNOSIS — R109 Unspecified abdominal pain: Secondary | ICD-10-CM | POA: Diagnosis not present

## 2017-05-21 DIAGNOSIS — Z79899 Other long term (current) drug therapy: Secondary | ICD-10-CM

## 2017-05-21 DIAGNOSIS — R1032 Left lower quadrant pain: Secondary | ICD-10-CM | POA: Diagnosis not present

## 2017-05-21 DIAGNOSIS — E43 Unspecified severe protein-calorie malnutrition: Secondary | ICD-10-CM

## 2017-05-21 DIAGNOSIS — R1011 Right upper quadrant pain: Secondary | ICD-10-CM | POA: Diagnosis not present

## 2017-05-21 DIAGNOSIS — R140 Abdominal distension (gaseous): Secondary | ICD-10-CM | POA: Diagnosis not present

## 2017-05-21 DIAGNOSIS — N179 Acute kidney failure, unspecified: Secondary | ICD-10-CM | POA: Diagnosis present

## 2017-05-21 DIAGNOSIS — R1084 Generalized abdominal pain: Secondary | ICD-10-CM | POA: Diagnosis not present

## 2017-05-21 DIAGNOSIS — R8290 Unspecified abnormal findings in urine: Secondary | ICD-10-CM | POA: Diagnosis present

## 2017-05-21 DIAGNOSIS — R188 Other ascites: Secondary | ICD-10-CM

## 2017-05-21 DIAGNOSIS — T85638A Leakage of other specified internal prosthetic devices, implants and grafts, initial encounter: Secondary | ICD-10-CM | POA: Diagnosis not present

## 2017-05-21 DIAGNOSIS — S37019A Minor contusion of unspecified kidney, initial encounter: Secondary | ICD-10-CM | POA: Diagnosis present

## 2017-05-21 DIAGNOSIS — R197 Diarrhea, unspecified: Secondary | ICD-10-CM | POA: Diagnosis present

## 2017-05-21 DIAGNOSIS — E785 Hyperlipidemia, unspecified: Secondary | ICD-10-CM | POA: Diagnosis present

## 2017-05-21 DIAGNOSIS — K859 Acute pancreatitis without necrosis or infection, unspecified: Secondary | ICD-10-CM | POA: Diagnosis not present

## 2017-05-21 DIAGNOSIS — Z87891 Personal history of nicotine dependence: Secondary | ICD-10-CM

## 2017-05-21 DIAGNOSIS — R945 Abnormal results of liver function studies: Secondary | ICD-10-CM | POA: Diagnosis not present

## 2017-05-21 DIAGNOSIS — I10 Essential (primary) hypertension: Secondary | ICD-10-CM | POA: Diagnosis present

## 2017-05-21 DIAGNOSIS — K760 Fatty (change of) liver, not elsewhere classified: Secondary | ICD-10-CM | POA: Diagnosis present

## 2017-05-21 DIAGNOSIS — E8889 Other specified metabolic disorders: Secondary | ICD-10-CM | POA: Diagnosis present

## 2017-05-21 HISTORY — DX: Infrarenal abdominal aortic aneurysm, without rupture: I71.43

## 2017-05-21 HISTORY — DX: Cholecystitis, unspecified: K81.9

## 2017-05-21 HISTORY — DX: Biliary acute pancreatitis without necrosis or infection: K85.10

## 2017-05-21 HISTORY — DX: Abdominal aortic aneurysm, without rupture: I71.4

## 2017-05-21 LAB — COMPREHENSIVE METABOLIC PANEL
ALT: 62 U/L (ref 17–63)
AST: 59 U/L — ABNORMAL HIGH (ref 15–41)
Albumin: 2.8 g/dL — ABNORMAL LOW (ref 3.5–5.0)
Alkaline Phosphatase: 308 U/L — ABNORMAL HIGH (ref 38–126)
Anion gap: 12 (ref 5–15)
BUN: 18 mg/dL (ref 6–20)
CO2: 24 mmol/L (ref 22–32)
Calcium: 9 mg/dL (ref 8.9–10.3)
Chloride: 100 mmol/L — ABNORMAL LOW (ref 101–111)
Creatinine, Ser: 1.2 mg/dL (ref 0.61–1.24)
GFR calc Af Amer: >60 mL/min (ref >60)
GFR calc non Af Amer: 59 mL/min — ABNORMAL LOW (ref >60)
Glucose, Bld: 145 mg/dL — ABNORMAL HIGH (ref 65–99)
Potassium: 4.4 mmol/L (ref 3.5–5.1)
Sodium: 136 mmol/L (ref 135–145)
Total Bilirubin: 2.3 mg/dL — ABNORMAL HIGH (ref 0.3–1.2)
Total Protein: 7.2 g/dL (ref 6.5–8.1)

## 2017-05-21 LAB — URINALYSIS, ROUTINE W REFLEX MICROSCOPIC
Bacteria, UA: NONE SEEN
Glucose, UA: 50 mg/dL — AB
Hgb urine dipstick: NEGATIVE
Ketones, ur: 20 mg/dL — AB
Leukocytes, UA: NEGATIVE
Nitrite: NEGATIVE
Protein, ur: 100 mg/dL — AB
Specific Gravity, Urine: 1.028 (ref 1.005–1.030)
pH: 5 (ref 5.0–8.0)

## 2017-05-21 LAB — CBC
HCT: 43.5 % (ref 39.0–52.0)
Hemoglobin: 14.8 g/dL (ref 13.0–17.0)
MCH: 29.1 pg (ref 26.0–34.0)
MCHC: 34 g/dL (ref 30.0–36.0)
MCV: 85.5 fL (ref 78.0–100.0)
Platelets: 648 10*3/uL — ABNORMAL HIGH (ref 150–400)
RBC: 5.09 MIL/uL (ref 4.22–5.81)
RDW: 13 % (ref 11.5–15.5)
WBC: 12.2 10*3/uL — ABNORMAL HIGH (ref 4.0–10.5)

## 2017-05-21 LAB — LIPASE, BLOOD: Lipase: 124 U/L — ABNORMAL HIGH (ref 11–51)

## 2017-05-21 MED ORDER — SODIUM CHLORIDE 0.9 % IV BOLUS (SEPSIS)
1000.0000 mL | Freq: Once | INTRAVENOUS | Status: AC
  Administered 2017-05-22: 1000 mL via INTRAVENOUS

## 2017-05-21 MED ORDER — ONDANSETRON HCL 4 MG/2ML IJ SOLN
4.0000 mg | Freq: Once | INTRAMUSCULAR | Status: AC
  Administered 2017-05-22: 4 mg via INTRAVENOUS
  Filled 2017-05-21: qty 2

## 2017-05-21 MED ORDER — HYDROMORPHONE HCL 1 MG/ML IJ SOLN
1.0000 mg | Freq: Once | INTRAMUSCULAR | Status: AC
  Administered 2017-05-22: 1 mg via INTRAVENOUS
  Filled 2017-05-21: qty 1

## 2017-05-21 NOTE — ED Notes (Signed)
RN attempted PIV x 2 without success 

## 2017-05-21 NOTE — ED Triage Notes (Signed)
Patient had gallbladder removed one week ago since then states having general abdominal pain "gas" and diarrhea. States felt very fatigue and said sent by [facility] for evaluation.

## 2017-05-21 NOTE — ED Notes (Signed)
Requested UA specimen, pt. Stated, I don't think I can do that right now.

## 2017-05-21 NOTE — ED Provider Notes (Signed)
[facility] EMERGENCY DEPARTMENT Provider Note   CSN: [id number] Arrival date & time: 05/21/17  1540     History   Chief Complaint Chief Complaint  Patient presents with  . Post-op Problem  . Abdominal Pain  . Diarrhea  . Fatigue    HPI Nathan Baxter is a 72 y.o. male.  HPI   72 year old male with abdominal pain and diarrhea.  He was admitted to the hospital February 28 - March 6 with gallstone pancreatitis.  He did have a cholecystectomy during this admission.  Is had a slow recovery since discharge.  In the past day he has had copious watery diarrhea and crampy lower abdominal pain.  He has not actually had a diarrheal stool since around 3 PM though.  Pain is across the lower abdomen and into the left flank.  Waxes and wanes but does not completely go away.  No fevers.  Nausea, no vomiting.  No urinary complaints. No blood in stool or melena.   Past Medical History:  Diagnosis Date  . HLD (hyperlipidemia)   . HTN (hypertension)   . Prediabetes     Patient Active Problem List   Diagnosis Date Noted  . Acute calculous cholecystitis 05/08/2017  . Hypokalemia 05/08/2017  . Chronic cholecystitis with calculus 05/05/2017  . Leukocytosis 05/03/2017  . Hypertensive urgency 05/03/2017  . Abdominal aortic aneurysm (AAA) (HCC) 05/03/2017  . Hyperglycemia 05/03/2017  . Gallstone pancreatitis 05/02/2017    Past Surgical History:  Procedure Laterality Date  . CHOLECYSTECTOMY N/A 05/06/2017   Procedure: LAPAROSCOPIC CHOLECYSTECTOMY;  Surgeon: Almond Lint, MD;  Location: [facility];  Service: General;  Laterality: N/A;       Home Medications    Prior to Admission medications   Medication Sig Start Date End Date Taking? Authorizing Provider  amLODipine (NORVASC) 10 MG tablet Take 1 tablet (10 mg total) by mouth daily. 05/09/17   Rodolph Bong, MD  aspirin EC 81 MG tablet Take 81 mg by mouth daily.    [provider]  cyanocobalamin 500 MCG tablet  Take 500 mcg by mouth daily.    [provider]  folic acid (FOLVITE) 1 MG tablet Take 1 mg by mouth daily.    [provider]  metoprolol tartrate (LOPRESSOR) 25 MG tablet Take 0.5 tablets (12.5 mg total) by mouth 2 (two) times daily. 05/08/17   Rodolph Bong, MD  Multiple Vitamin (MULTIVITAMIN) tablet Take 1 tablet by mouth daily.    [provider]  Omega-3 Fatty Acids (FISH OIL) 1000 MG CPDR Take by mouth.    [provider]  simethicone (MYLICON) 80 MG chewable tablet Chew 2 tablets (160 mg total) by mouth 4 (four) times daily as needed for flatulence. 05/08/17   Rodolph Bong, MD  traMADol (ULTRAM) 50 MG tablet Take 1 tablet (50 mg total) by mouth every 6 (six) hours as needed for moderate pain. 05/08/17   Rodolph Bong, MD    Family History Family History  Problem Relation Age of Onset  . Breast cancer Mother   . Hypertension Father   . Hypertension Sister     Social History Social History   Tobacco Use  . Smoking status: Former Games developer  . Smokeless tobacco: Never Used  Substance Use Topics  . Alcohol use: Yes    Frequency: Never  . Drug use: No     Allergies   Sulfa antibiotics   Review of Systems Review of Systems  All systems reviewed and  negative, other than as noted in HPI.  Physical Exam Updated Vital Signs BP 113/80 (BP Location: Right Arm)   Pulse 94   Temp 98.2 F (36.8 C) (Oral)   Resp 18   Ht 6' (1.829 m)   Wt 98.9 kg (218 lb)   SpO2 100%   BMI 29.57 kg/m   Physical Exam  Constitutional: He appears well-developed and well-nourished. No distress.  HENT:  Head: Normocephalic and atraumatic.  Eyes: Conjunctivae are normal. Right eye exhibits no discharge. Left eye exhibits no discharge.  Neck: Neck supple.  Cardiovascular: Normal rate, regular rhythm and normal heart sounds. Exam reveals no gallop and no friction rub.  No murmur heard. Pulmonary/Chest: Effort normal and breath sounds normal. No  respiratory distress.  Abdominal: Soft. He exhibits distension. There is tenderness.  Some mild to moderate distention.  Laparoscopic incisions appear to be healing appropriately.  Tenderness suprapubically and in the left lower quadrant without rebound or guarding.  Musculoskeletal: He exhibits no edema or tenderness.  Neurological: He is alert.  Skin: Skin is warm and dry.  Psychiatric: He has a normal mood and affect. His behavior is normal. Thought content normal.  Nursing note and vitals reviewed.    ED Treatments / Results  Labs (all labs ordered are listed, but only abnormal results are displayed) Labs Reviewed  LIPASE, BLOOD - Abnormal; Notable for the following components:      Result Value   Lipase 124 (*)    All other components within normal limits  COMPREHENSIVE METABOLIC PANEL - Abnormal; Notable for the following components:   Chloride 100 (*)    Glucose, Bld 145 (*)    Albumin 2.8 (*)    AST 59 (*)    Alkaline Phosphatase 308 (*)    Total Bilirubin 2.3 (*)    GFR calc non Af Amer 59 (*)    All other components within normal limits  CBC - Abnormal; Notable for the following components:   WBC 12.2 (*)    Platelets 648 (*)    All other components within normal limits  URINALYSIS, ROUTINE W REFLEX MICROSCOPIC - Abnormal; Notable for the following components:   Color, Urine AMBER (*)    APPearance CLOUDY (*)    Glucose, UA 50 (*)    Bilirubin Urine MODERATE (*)    Ketones, ur 20 (*)    Protein, ur 100 (*)    Squamous Epithelial / LPF 0-5 (*)    All other components within normal limits  C DIFFICILE QUICK SCREEN W PCR REFLEX    EKG  EKG Interpretation  Date/Time:  Tuesday May 21 2017 15:59:38 EDT Ventricular Rate:  98 PR Interval:  154 QRS Duration: 104 QT Interval:  348 QTC Calculation: 444 R Axis:   -75 Text Interpretation:  Normal sinus rhythm Left axis deviation Anterolateral infarct , age undetermined Abnormal ECG Confirmed by Raeford Razor  [id number]) on 05/21/2017 11:27:00 PM       Radiology No results found.  Procedures Procedures (including critical care time)  Medications Ordered in ED Medications  sodium chloride 0.9 % bolus 1,000 mL (not administered)  HYDROmorphone (DILAUDID) injection 1 mg (not administered)  ondansetron (ZOFRAN) injection 4 mg (not administered)     Initial Impression / Assessment and Plan / ED Course  I have reviewed the triage vital signs and the nursing notes.  Pertinent labs & imaging results that were available during my care of the patient were reviewed by me and considered in my medical decision  making (see chart for details).     72 year old male with diarrhea and lower abdominal pain.  He is afebrile.  He does have a mild leukocytosis, but this is improved from prior labs.  Status post recent cholecystectomy for gallstone pancreatitis.  Lipase is only minimally elevated.  His pain and tenderness is actually in the lower abdomen.  Suprapubic to left lower quadrant.  This may potentially be an enteritis or colitis.  Given his recent surgery though, will CT.  IV fluids and symptomatic treatment.  Final Clinical Impressions(s) / ED Diagnoses   Final diagnoses:  Diarrhea, unspecified type  Abdominal pain, unspecified abdominal location    ED Discharge Orders    None       Raeford RazorKohut, Ashawnti Tangen, MD 05/21/17 2344

## 2017-05-22 ENCOUNTER — Inpatient Hospital Stay (HOSPITAL_COMMUNITY): Payer: Medicare HMO

## 2017-05-22 ENCOUNTER — Other Ambulatory Visit: Payer: Self-pay

## 2017-05-22 ENCOUNTER — Encounter (HOSPITAL_COMMUNITY): Payer: Self-pay | Admitting: Nurse Practitioner

## 2017-05-22 ENCOUNTER — Emergency Department (HOSPITAL_COMMUNITY): Payer: Medicare HMO

## 2017-05-22 DIAGNOSIS — Z9049 Acquired absence of other specified parts of digestive tract: Secondary | ICD-10-CM

## 2017-05-22 DIAGNOSIS — R945 Abnormal results of liver function studies: Secondary | ICD-10-CM

## 2017-05-22 DIAGNOSIS — R188 Other ascites: Secondary | ICD-10-CM | POA: Diagnosis not present

## 2017-05-22 DIAGNOSIS — K9189 Other postprocedural complications and disorders of digestive system: Secondary | ICD-10-CM | POA: Diagnosis not present

## 2017-05-22 DIAGNOSIS — R748 Abnormal levels of other serum enzymes: Secondary | ICD-10-CM | POA: Diagnosis not present

## 2017-05-22 DIAGNOSIS — R1084 Generalized abdominal pain: Secondary | ICD-10-CM | POA: Diagnosis not present

## 2017-05-22 DIAGNOSIS — Z881 Allergy status to other antibiotic agents status: Secondary | ICD-10-CM | POA: Diagnosis not present

## 2017-05-22 DIAGNOSIS — E785 Hyperlipidemia, unspecified: Secondary | ICD-10-CM | POA: Diagnosis present

## 2017-05-22 DIAGNOSIS — I714 Abdominal aortic aneurysm, without rupture: Secondary | ICD-10-CM | POA: Diagnosis present

## 2017-05-22 DIAGNOSIS — Z4803 Encounter for change or removal of drains: Secondary | ICD-10-CM | POA: Diagnosis not present

## 2017-05-22 DIAGNOSIS — N179 Acute kidney failure, unspecified: Secondary | ICD-10-CM | POA: Diagnosis not present

## 2017-05-22 DIAGNOSIS — I10 Essential (primary) hypertension: Secondary | ICD-10-CM | POA: Diagnosis not present

## 2017-05-22 DIAGNOSIS — Z79899 Other long term (current) drug therapy: Secondary | ICD-10-CM | POA: Diagnosis not present

## 2017-05-22 DIAGNOSIS — I7143 Infrarenal abdominal aortic aneurysm, without rupture: Secondary | ICD-10-CM | POA: Diagnosis present

## 2017-05-22 DIAGNOSIS — K839 Disease of biliary tract, unspecified: Secondary | ICD-10-CM | POA: Diagnosis not present

## 2017-05-22 DIAGNOSIS — K760 Fatty (change of) liver, not elsewhere classified: Secondary | ICD-10-CM | POA: Diagnosis not present

## 2017-05-22 DIAGNOSIS — T85638A Leakage of other specified internal prosthetic devices, implants and grafts, initial encounter: Secondary | ICD-10-CM | POA: Diagnosis not present

## 2017-05-22 DIAGNOSIS — R7303 Prediabetes: Secondary | ICD-10-CM | POA: Diagnosis not present

## 2017-05-22 DIAGNOSIS — R140 Abdominal distension (gaseous): Secondary | ICD-10-CM | POA: Diagnosis not present

## 2017-05-22 DIAGNOSIS — K859 Acute pancreatitis without necrosis or infection, unspecified: Secondary | ICD-10-CM | POA: Diagnosis present

## 2017-05-22 DIAGNOSIS — R197 Diarrhea, unspecified: Secondary | ICD-10-CM | POA: Diagnosis not present

## 2017-05-22 DIAGNOSIS — K8012 Calculus of gallbladder with acute and chronic cholecystitis without obstruction: Secondary | ICD-10-CM | POA: Diagnosis not present

## 2017-05-22 DIAGNOSIS — T8143XA Infection following a procedure, organ and space surgical site, initial encounter: Secondary | ICD-10-CM | POA: Diagnosis not present

## 2017-05-22 DIAGNOSIS — Z79891 Long term (current) use of opiate analgesic: Secondary | ICD-10-CM | POA: Diagnosis not present

## 2017-05-22 DIAGNOSIS — S37011A Minor contusion of right kidney, initial encounter: Secondary | ICD-10-CM | POA: Diagnosis not present

## 2017-05-22 DIAGNOSIS — R1011 Right upper quadrant pain: Secondary | ICD-10-CM | POA: Diagnosis not present

## 2017-05-22 DIAGNOSIS — Z8249 Family history of ischemic heart disease and other diseases of the circulatory system: Secondary | ICD-10-CM | POA: Diagnosis not present

## 2017-05-22 DIAGNOSIS — E8889 Other specified metabolic disorders: Secondary | ICD-10-CM | POA: Diagnosis present

## 2017-05-22 DIAGNOSIS — S37019A Minor contusion of unspecified kidney, initial encounter: Secondary | ICD-10-CM | POA: Diagnosis present

## 2017-05-22 DIAGNOSIS — R8290 Unspecified abnormal findings in urine: Secondary | ICD-10-CM | POA: Diagnosis present

## 2017-05-22 DIAGNOSIS — E860 Dehydration: Secondary | ICD-10-CM | POA: Diagnosis present

## 2017-05-22 DIAGNOSIS — Z87891 Personal history of nicotine dependence: Secondary | ICD-10-CM | POA: Diagnosis not present

## 2017-05-22 DIAGNOSIS — K838 Other specified diseases of biliary tract: Secondary | ICD-10-CM | POA: Diagnosis not present

## 2017-05-22 DIAGNOSIS — R109 Unspecified abdominal pain: Secondary | ICD-10-CM | POA: Diagnosis not present

## 2017-05-22 DIAGNOSIS — Z7982 Long term (current) use of aspirin: Secondary | ICD-10-CM | POA: Diagnosis not present

## 2017-05-22 LAB — MAGNESIUM: Magnesium: 1.9 mg/dL (ref 1.7–2.4)

## 2017-05-22 LAB — COMPREHENSIVE METABOLIC PANEL
ALBUMIN: 2.2 g/dL — AB (ref 3.5–5.0)
ALT: 46 U/L (ref 17–63)
ANION GAP: 9 (ref 5–15)
AST: 49 U/L — ABNORMAL HIGH (ref 15–41)
Alkaline Phosphatase: 256 U/L — ABNORMAL HIGH (ref 38–126)
BILIRUBIN TOTAL: 2.2 mg/dL — AB (ref 0.3–1.2)
BUN: 18 mg/dL (ref 6–20)
CO2: 22 mmol/L (ref 22–32)
Calcium: 8.1 mg/dL — ABNORMAL LOW (ref 8.9–10.3)
Chloride: 104 mmol/L (ref 101–111)
Creatinine, Ser: 0.94 mg/dL (ref 0.61–1.24)
GFR calc Af Amer: >60 mL/min (ref >60)
GFR calc non Af Amer: >60 mL/min (ref >60)
GLUCOSE: 142 mg/dL — AB (ref 65–99)
POTASSIUM: 4.4 mmol/L (ref 3.5–5.1)
Sodium: 135 mmol/L (ref 135–145)
TOTAL PROTEIN: 5.8 g/dL — AB (ref 6.5–8.1)

## 2017-05-22 LAB — CBC WITH DIFFERENTIAL/PLATELET
BASOS PCT: 0 %
Basophils Absolute: 0 10*3/uL (ref 0.0–0.1)
EOS ABS: 0.6 10*3/uL (ref 0.0–0.7)
Eosinophils Relative: 5 %
HEMATOCRIT: 40.6 % (ref 39.0–52.0)
HEMOGLOBIN: 13.7 g/dL (ref 13.0–17.0)
Lymphocytes Relative: 10 %
Lymphs Abs: 1.3 10*3/uL (ref 0.7–4.0)
MCH: 29.3 pg (ref 26.0–34.0)
MCHC: 33.7 g/dL (ref 30.0–36.0)
MCV: 86.8 fL (ref 78.0–100.0)
MONOS PCT: 3 %
Monocytes Absolute: 0.4 10*3/uL (ref 0.1–1.0)
NEUTROS ABS: 10.6 10*3/uL — AB (ref 1.7–7.7)
NEUTROS PCT: 82 %
Platelets: 441 10*3/uL — ABNORMAL HIGH (ref 150–400)
RBC: 4.68 MIL/uL (ref 4.22–5.81)
RDW: 13.5 % (ref 11.5–15.5)
WBC: 12.9 10*3/uL — ABNORMAL HIGH (ref 4.0–10.5)

## 2017-05-22 LAB — PHOSPHORUS: PHOSPHORUS: 4 mg/dL (ref 2.5–4.6)

## 2017-05-22 LAB — PROTIME-INR
INR: 1.14
PROTHROMBIN TIME: 14.5 s (ref 11.4–15.2)

## 2017-05-22 LAB — LIPASE, BLOOD: Lipase: 113 U/L — ABNORMAL HIGH (ref 11–51)

## 2017-05-22 LAB — LACTATE DEHYDROGENASE: LDH: 162 U/L (ref 98–192)

## 2017-05-22 MED ORDER — SODIUM CHLORIDE 0.9 % IV SOLN: INTRAVENOUS | Status: AC

## 2017-05-22 MED ORDER — PIPERACILLIN-TAZOBACTAM 3.375 G IVPB
3.3750 g | Freq: Three times a day (TID) | INTRAVENOUS | Status: DC
  Administered 2017-05-22 – 2017-05-24 (×5): 3.375 g via INTRAVENOUS
  Filled 2017-05-22 (×7): qty 50

## 2017-05-22 MED ORDER — ONDANSETRON HCL 4 MG/2ML IJ SOLN: 4.0000 mg | Freq: Four times a day (QID) | INTRAMUSCULAR | Status: DC | PRN

## 2017-05-22 MED ORDER — ENOXAPARIN SODIUM 40 MG/0.4ML ~~LOC~~ SOLN: 40.0000 mg | Freq: Every day | SUBCUTANEOUS | Status: DC

## 2017-05-22 MED ORDER — HYDROMORPHONE HCL 1 MG/ML IJ SOLN
1.0000 mg | Freq: Once | INTRAMUSCULAR | Status: AC
  Administered 2017-05-22: 1 mg via INTRAVENOUS
  Filled 2017-05-22: qty 1

## 2017-05-22 MED ORDER — PIPERACILLIN-TAZOBACTAM 3.375 G IVPB 30 MIN
3.3750 g | Freq: Once | INTRAVENOUS | Status: AC
  Administered 2017-05-22: 3.375 g via INTRAVENOUS
  Filled 2017-05-22: qty 50

## 2017-05-22 MED ORDER — IOPAMIDOL (ISOVUE-300) INJECTION 61%
INTRAVENOUS | Status: AC
  Administered 2017-05-22: 100 mL
  Filled 2017-05-22: qty 30

## 2017-05-22 MED ORDER — PROMETHAZINE HCL 25 MG/ML IJ SOLN: 25.0000 mg | Freq: Four times a day (QID) | INTRAMUSCULAR | Status: DC | PRN

## 2017-05-22 MED ORDER — ACETAMINOPHEN 650 MG RE SUPP: 650.0000 mg | Freq: Four times a day (QID) | RECTAL | Status: DC | PRN

## 2017-05-22 MED ORDER — IOPAMIDOL (ISOVUE-300) INJECTION 61%
INTRAVENOUS | Status: AC
  Filled 2017-05-22: qty 100

## 2017-05-22 MED ORDER — PROMETHAZINE HCL 25 MG/ML IJ SOLN: 6.2500 mg | Freq: Four times a day (QID) | INTRAMUSCULAR | Status: DC | PRN

## 2017-05-22 MED ORDER — FAMOTIDINE 20 MG PO TABS
20.0000 mg | ORAL_TABLET | Freq: Two times a day (BID) | ORAL | Status: DC
  Administered 2017-05-22 – 2017-05-23 (×2): 20 mg via ORAL
  Filled 2017-05-22 (×3): qty 1

## 2017-05-22 MED ORDER — SODIUM CHLORIDE 0.9 % IV BOLUS (SEPSIS)
1000.0000 mL | Freq: Once | INTRAVENOUS | Status: AC
  Administered 2017-05-22: 1000 mL via INTRAVENOUS

## 2017-05-22 MED ORDER — TECHNETIUM TC 99M MEBROFENIN IV KIT
5.1000 | PACK | Freq: Once | INTRAVENOUS | Status: AC | PRN
  Administered 2017-05-22: 5.1 via INTRAVENOUS

## 2017-05-22 MED ORDER — SODIUM CHLORIDE 0.9% FLUSH
3.0000 mL | Freq: Two times a day (BID) | INTRAVENOUS | Status: DC
  Administered 2017-05-22 – 2017-05-29 (×8): 3 mL via INTRAVENOUS

## 2017-05-22 MED ORDER — HYDROMORPHONE HCL 1 MG/ML IJ SOLN
0.5000 mg | INTRAMUSCULAR | Status: DC | PRN
  Administered 2017-05-22 – 2017-05-25 (×8): 1 mg via INTRAVENOUS
  Filled 2017-05-22 (×8): qty 1

## 2017-05-22 MED ORDER — ACETAMINOPHEN 325 MG PO TABS
650.0000 mg | ORAL_TABLET | Freq: Four times a day (QID) | ORAL | Status: DC | PRN
  Administered 2017-05-24: 650 mg via ORAL
  Filled 2017-05-22: qty 2

## 2017-05-22 MED ORDER — POTASSIUM CHLORIDE IN NACL 20-0.9 MEQ/L-% IV SOLN: INTRAVENOUS | Status: DC

## 2017-05-22 MED ORDER — SODIUM CHLORIDE 0.9 % IV SOLN
INTRAVENOUS | Status: DC
  Administered 2017-05-22 – 2017-05-23 (×2): via INTRAVENOUS

## 2017-05-22 NOTE — ED Notes (Signed)
Pt completed PO contrast

## 2017-05-22 NOTE — H&P (Addendum)
History and Physical    Nathan Baxter ZOX:[id number] DOB: 1946/02/23 DOA: 05/21/2017  **Will admit patient based on the expectation that the patient will need hospitalization/ hospital care that crosses at least 2 midnights  PCP: Patient, No Pcp Per   Attending physician: Ophelia Charter  Patient coming from/Resides with: Private residence  Chief Complaint: Abdominal pain and watery diarrhea  HPI: Nathan Baxter is a 72 y.o. male with medical history significant for hypertension, dyslipidemia, and prediabetes.  Patient was hospitalized from 2/28-3/6 secondary to biliary pancreatitis.  Patient underwent laparoscopic cholecystectomy on 3/4 but intraoperative cholangiogram was unable to be completed secondary to friability of the infundibulum.  GI was consulted during the hospitalization as well.  MRCP was completed which revealed no signs of biliary obstruction.  On the date of discharge patient was reporting what was described as global abdominal pain associated with diaphoresis.  Symptoms were improved with Tylenol simethicone and rest.  He was passing flatus but still reported a sensation of gaseous bloating.  Last BM had been 1 week prior to that but he wanted to wait until he got home to take a laxative.  Patient reports that since arriving home he has had no improvement in his distention and developed gradual than more frequent episodes of watery diarrhea.  He did not notice any worsening of pain with eating.  He has not had any fevers chills or blood in stool.  He has not had any vomiting but has been nauseous.  Over the past 24 hours he has had high volume multiple episodes of diarrhea which stopped prior to arrival.  Reported consistent left upper quadrant as well as lower bilateral pelvic discomfort over the past 24 hours.  He reports his surgical incision pain is well controlled.  Patient presented to the ER after notifying his surgeon of his symptoms.  The ER he was hemodynamically stable, afebrile,  and not hypoxemic.  Labs revealed mild hyperglycemia, elevated alkaline phosphatase, low albumin, mildly elevated lipase at 124, AST of 59, total bilirubin 2.3, creatinine elevated 2 times greater than baseline consistent with acute kidney injury.  Patient also had elevated platelets of 648,000 concerning for hemoconcentration and dehydration.  His urinalysis was also abnormal and concerning for possible UTI although severe dehydration can give the same appearance on urinalysis.  CT of the abdomen revealed moderate volume of abdominopelvic ascites, persistent peripancreatic fat stranding about the head and body with small amount of peripancreatic fluid suspicious for pancreatitis without focal fluid collection.  There was a small fluid density adjacent to surgical clips in the gallbladder fossa that may be a cystic duct rate remnant or postoperative seroma but no definitive evidence of biloma.  There was also a new 2.9 x 2.8 cm slightly heterogeneous structure in the right kidney suggestive of a hematoma without active bleeding.  Patient will be admitted with recurrent pancreatitis after cholecystectomy.    ED Course:  Vital Signs: BP 125/86   Pulse 89   Temp 97.7 F (36.5 C) (Oral)   Resp 18   Ht 6' (1.829 m)   Wt 98.9 kg (218 lb)   SpO2 93%   BMI 29.57 kg/m  CT abdomen and pelvis: As above Lab data: Sodium 136, potassium 4.4, chloride 100, CO2 24, glucose 145, BUN 18, creatinine 1.2, calcium 9.0, anion gap 12, alkaline phosphatase 308, albumin 2.8, lipase 124, AST 59, total bilirubin 2.3, white count 12,200 differential not obtained, hemoglobin 14.8, platelets 648,000, urinalysis abnormal with cloudy appearance, moderate bilirubin, amber color,  50 glucose, 20 ketones, 100 of protein, the presence of hyaline casts and mucus, RBCs 6-30, WBC 6-30. Medications and treatments: Normal saline bolus times 2 L, Dilaudid 1 mg IV x3 doses, Zofran 4 mg IV x1  Review of Systems:  In addition to the HPI  above,  No Fever-chills, myalgias or other constitutional symptoms No Headache, changes with Vision or hearing, new weakness, tingling, numbness in any extremity, dizziness, dysarthria or word finding difficulty, gait disturbance or imbalance, tremors or seizure activity No problems swallowing food or Liquids, indigestion/reflux, choking or coughing while eating, abdominal pain with or after eating No Chest pain, Cough or Shortness of Breath, palpitations, orthopnea or DOE No emesis, melena,hematochezia, dark tarry stools No dysuria, malodorous urine, hematuria or flank pain No new skin rashes, lesions, masses or bruises, No new joint pains, aches, swelling or redness No recent unintentional weight gain or loss No polyuria, polydypsia or polyphagia   Past Medical History:  Diagnosis Date  . Acute biliary pancreatitis   . Aneurysm of infrarenal abdominal aorta (HCC)   . Cholecystitis   . HLD (hyperlipidemia)   . HTN (hypertension)   . Prediabetes     Past Surgical History:  Procedure Laterality Date  . CHOLECYSTECTOMY N/A 05/06/2017   Procedure: LAPAROSCOPIC CHOLECYSTECTOMY;  Surgeon: Almond Lint, MD;  Location: [facility];  Service: General;  Laterality: N/A;    Social History   Socioeconomic History  . Marital status: Single    Spouse name: Not on file  . Number of children: Not on file  . Years of education: Not on file  . Highest education level: Not on file  Social Needs  . Financial resource strain: Not on file  . Food insecurity - worry: Not on file  . Food insecurity - inability: Not on file  . Transportation needs - medical: Not on file  . Transportation needs - non-medical: Not on file  Occupational History  . Not on file  Tobacco Use  . Smoking status: Former Games developer  . Smokeless tobacco: Never Used  Substance and Sexual Activity  . Alcohol use: Yes    Frequency: Never  . Drug use: No  . Sexual activity: Not on file  Other Topics Concern  . Not on file    Social History Narrative  . Not on file    Mobility: Independent Work history: Not obtained   Allergies  Allergen Reactions  . Sulfa Antibiotics Rash    Family History  Problem Relation Age of Onset  . Breast cancer Mother   . Hypertension Father   . Hypertension Sister      Prior to Admission medications   Medication Sig Start Date End Date Taking? Authorizing Provider  amLODipine (NORVASC) 10 MG tablet Take 1 tablet (10 mg total) by mouth daily. 05/09/17  Yes Rodolph Bong, MD  metoprolol tartrate (LOPRESSOR) 25 MG tablet Take 0.5 tablets (12.5 mg total) by mouth 2 (two) times daily. 05/08/17  Yes Rodolph Bong, MD  simethicone (MYLICON) 80 MG chewable tablet Chew 2 tablets (160 mg total) by mouth 4 (four) times daily as needed for flatulence. 05/08/17  Yes Rodolph Bong, MD  traMADol (ULTRAM) 50 MG tablet Take 1 tablet (50 mg total) by mouth every 6 (six) hours as needed for moderate pain. 05/08/17  Yes Rodolph Bong, MD    Physical Exam: Vitals:   05/22/17 0730 05/22/17 0745 05/22/17 0800 05/22/17 0815  BP: 129/83 131/84 137/84 125/86  Pulse: 91 87 89 89  Resp: 17 (!) 28 (!) 23 18  Temp:      TempSrc:      SpO2: 91% 96% 95% 93%  Weight:      Height:          Constitutional: NAD, calm, uncomfortable 2/2 ongoing lower abdominal pain Eyes: PERRL, lids and conjunctivae normal ENMT: Mucous membranes are very dry. Posterior pharynx clear of any exudate or lesions. Normal dentition.  Neck: normal, supple, no masses, no thyromegaly Respiratory: clear to auscultation bilaterally, no wheezing, no crackles.  Diminished in the bases normal respiratory effort. No accessory muscle use. RA Cardiovascular: Regular rate and rhythm, no murmurs / rubs / gallops. No extremity edema. 2+ pedal pulses. No carotid bruits.  Abdomen: Focal tenderness left upper quadrant and somewhat in the epigastrium as well as in the suprapubic and left lower quadrant regions, no masses  palpated. No hepatosplenomegaly. Bowel sounds white hypoactive.  Abdomen soft but distended Musculoskeletal: no clubbing / cyanosis. No joint deformity upper and lower extremities. Good ROM, no contractures. Normal muscle tone.  Skin: no rashes, lesions, ulcers. No induration Neurologic: CN 2-12 grossly intact. Sensation intact, DTR normal. Strength 5/5 x all 4 extremities.  Psychiatric: Normal judgment and insight. Alert and oriented x 3. Normal mood.    Labs on Admission: I have personally reviewed following labs and imaging studies  CBC: Recent Labs  Lab 05/21/17 1611  WBC 12.2*  HGB 14.8  HCT 43.5  MCV 85.5  PLT 648*   Basic Metabolic Panel: Recent Labs  Lab 05/21/17 1611  NA 136  K 4.4  CL 100*  CO2 24  GLUCOSE 145*  BUN 18  CREATININE 1.20  CALCIUM 9.0   GFR: Estimated Creatinine Clearance: 67.8 mL/min (by C-G formula based on SCr of 1.2 mg/dL). Liver Function Tests: Recent Labs  Lab 05/21/17 1611  AST 59*  ALT 62  ALKPHOS 308*  BILITOT 2.3*  PROT 7.2  ALBUMIN 2.8*   Recent Labs  Lab 05/21/17 1611  LIPASE 124*   No results for input(s): AMMONIA in the last 168 hours. Coagulation Profile: No results for input(s): INR, PROTIME in the last 168 hours. Cardiac Enzymes: No results for input(s): CKTOTAL, CKMB, CKMBINDEX, TROPONINI in the last 168 hours. BNP (last 3 results) No results for input(s): PROBNP in the last 8760 hours. HbA1C: No results for input(s): HGBA1C in the last 72 hours. CBG: No results for input(s): GLUCAP in the last 168 hours. Lipid Profile: No results for input(s): CHOL, HDL, LDLCALC, TRIG, CHOLHDL, LDLDIRECT in the last 72 hours. Thyroid Function Tests: No results for input(s): TSH, T4TOTAL, FREET4, T3FREE, THYROIDAB in the last 72 hours. Anemia Panel: No results for input(s): VITAMINB12, FOLATE, FERRITIN, TIBC, IRON, RETICCTPCT in the last 72 hours. Urine analysis:    Component Value Date/Time   COLORURINE AMBER (A)  05/21/2017 2051   APPEARANCEUR CLOUDY (A) 05/21/2017 2051   LABSPEC 1.028 05/21/2017 2051   PHURINE 5.0 05/21/2017 2051   GLUCOSEU 50 (A) 05/21/2017 2051   HGBUR NEGATIVE 05/21/2017 2051   BILIRUBINUR MODERATE (A) 05/21/2017 2051   KETONESUR 20 (A) 05/21/2017 2051   PROTEINUR 100 (A) 05/21/2017 2051   NITRITE NEGATIVE 05/21/2017 2051   LEUKOCYTESUR NEGATIVE 05/21/2017 2051   Sepsis Labs: @LABRCNTIP (procalcitonin:4,lacticidven:4) )No results found for this or any previous visit (from the past 240 hour(s)).   Radiological Exams on Admission: Ct Abdomen Pelvis W Contrast  Result Date: 05/22/2017 CLINICAL DATA:  Abdominal pain and distension. Diarrhea. Cholecystectomy 16 days prior. EXAM: CT  ABDOMEN AND PELVIS WITH CONTRAST TECHNIQUE: Multidetector CT imaging of the abdomen and pelvis was performed using the standard protocol following bolus administration of intravenous contrast. CONTRAST:  100mL ISOVUE-300 IOPAMIDOL (ISOVUE-300) INJECTION 61% COMPARISON:  CT 05/02/2017.  MRI 05/04/2017 FINDINGS: Lower chest: Linear atelectasis in both lower lobes. Small right pleural effusion. There are coronary artery calcifications. Hepatobiliary: Decreased hepatic density consistent with steatosis. No focal hepatic lesion. Interval cholecystectomy from prior imaging. Small amount of free fluid in the gallbladder fossa image 33 series 3. More focal rounded fluid proximal to cholecystectomy clips measuring 1.4 x 2.3 cm may be a cystic duct remnant or postoperative seroma. No internal air. No biliary dilatation. Pancreas: Peripancreatic fat stranding and free fluid about the head and body of the pancreas. No ductal dilatation. No organized peripancreatic collection. Spleen: Normal in size without focal abnormality. Adrenals/Urinary Tract: Normal adrenal glands. No hydronephrosis or perinephric edema. Homogeneous renal enhancement with symmetric excretion on delayed phase imaging. Medial to the right kidney is a 2.9  x 2.8 cm heterogeneous low-density structure that is new from prior exam. This appears separate from the right kidney and is adjacent to the renal artery. No surrounding fat stranding. Urinary bladder is non distended without wall thickening. Stomach/Bowel: Stomach physiologically distended. No gastric wall thickening. Presence of intra-abdominal ascites partially limits bowel evaluation. Allowing for this, no evidence of bowel wall thickening, obstruction or inflammatory change. Administered enteric contrast reaches distal small bowel. Appendix is normal. Vascular/Lymphatic: 3.3 cm infrarenal abdominal aortic aneurysm, unchanged. Moderate aortic atherosclerosis. Limited assessment for adenopathy given presence of ascites. Reproductive: Prostate is unremarkable. Other: Moderate abdominopelvic ascites is new from prior exam. Fluid measures simple fluid density. No free air. No evidence of intra-abdominal abscess. Small fat containing umbilical hernia. Musculoskeletal: Stable degenerative change in the lumbar spine with endplate irregularity at L3-L4. There are no acute or suspicious osseous abnormalities. IMPRESSION: 1. Moderate volume abdominopelvic ascites, new from prior exam. 2. Persistent peripancreatic fat stranding about the head and body, with small amount peripancreatic fluid, suspicious for pancreatitis. No focal fluid collection. 3. Small fluid density structure adjacent to surgical clips in the gallbladder fossa may be a cystic duct remnant or postoperative seroma. No surrounding inflammation or internal air to suggest abscess. 4. A 2.9 x 2.8 cm slightly heterogeneous structure medial to the right kidney is new from prior exam, suggesting hematoma. No active bleeding. 5. Chronic findings include hepatic steatosis and infrarenal abdominal aortic aneurysm with maximal dimension 3.3 cm. Electronically Signed   By: Rubye OaksMelanie  Ehinger M.D.   On: 05/22/2017 04:05    EKG: (Independently reviewed) sinus rhythm  with a ventricular rate of 98 bpm, QTC 444 ms, abnormal R wave rotation with loss of R waves beginning at V3, left axis deviation, incomplete left bundle branch block, no definitive acute ischemic changes  Assessment/Plan Principal Problem:   Acute pancreatitis/S/P cholecystectomy -Patient presents with unresolved abdominal pain that has worsened and is associated with watery diarrhea.  Patient recently admitted with presumed biliary pancreatitis and is status post laparoscopic cholecystectomy. -Patient was unable to undergo IOC secondary to friable infundibulum so could have retained sludge and/or stones contributing to current process -There was also finding of small fluid collection in the gallbladder fossa that was felt to be seroma but could be a early biloma -Lipase only mildly elevated and will continue to trend -Total bilirubin greater than 2 with mild elevation in AST and alkaline phosphatase but these findings could also be related to profound dehydration -N.p.o. -Aggressive  volume rehydration for at least the first 12 hours with fluids at 200/ hour then decrease to 125/hour -Given uncertainty of etiology of abdominal pain (noting patient also has left lower quadrant and suprapubic pain that could be related to UTI -see below) will begin gram-negative coverage with IV Zosyn/pharmacy managing -Obtain pre-antibiotic blood cultures-he does not have any signs of sepsis but continue to follow closely -General surgery has been consulted -MRCP-if any ductal obstructive findings will need to consult [facility] -Dilaudid IV for severe pain -IV Zofran/low-dose Phenergan for recurrent nausea -Unable to utilize NSAIDs in context of acute kidney injury -Patient has an associated non-anion gap ketosis secondary to dehydration -CT severity of pancreatitis score as follows:  CT severity equals 2  Estimated mortality equals 3%  Estimated rate of complications equals 8% -Obtain LDH 162-Ranson's  criteria=1  Active Problems:   Acute kidney injury   -Creatinine on date of discharge was 0.59 -Current creatinine is nearly doubled at 1.2 suggestive of acute kidney injury -Above labs were obtained yesterday afternoon prior to treatment so we will repeat labs stat -Continue hydration as above     Renal hematoma/right -New finding since preoperative CT of the abdomen and pelvis -We will not utilize pharmacological DVT prophylaxis -Likely require follow-up CT to ensure stability    Watery diarrhea -Suspect secondary to reemergence of pancreatitis -Less likely is C. difficile colitis since no findings of colitis noted on CT the abdomen -For completeness of exam agree with checking stool for C. difficile if patient has recurrent diarrhea    HTN (hypertension) -Current blood pressure in the 120 range -Hold preadmission Norvasc in context of severe dehydration -Follow blood pressure-if begins to increase can utilize either IV Lopressor or IV hydralazine    HLD (hyperlipidemia) -Not on pharmacotherapy prior to admission    Abnormal urinalysis -Likely secondary to profound dehydration but for completeness of exam rule out UTI by obtaining concomitant urine culture    Aneurysm of infrarenal abdominal aorta  -Incidental finding on recent CT -Measures 3.3 cm -Radiologist recommended follow-up ultrasound in 3 years    **Additional lab, imaging and/or diagnostic evaluation at discretion of supervising physician  DVT prophylaxis: SCDs Code Status: Full Family Communication: No family at bedside Disposition Plan: Home Consults called: General surgeon on call/primary surgeon Dr. Darra Lis ANP-BC [facility] Pager [phone]   If 7PM-7AM, please contact night-coverage [email or web address] Password [email or web address]  05/22/2017, 8:30 AM

## 2017-05-22 NOTE — ED Notes (Signed)
Pt ambulated to restroom independently with steady gait; pt reporting worsened abd pain

## 2017-05-22 NOTE — H&P (View-Only) (Signed)
[facility], [facility]: 10:19 AM 05/22/2017  LOS: 0 days    Referring Provider: Dr Hal Hope  Primary Care Physician:  No Pcp Primary Gastroenterologist:  Seen inpt 3/1 by Dr Silverio Decamp.      Reason for Consultation: upper abdominal pain and elevated LFTs   HPI: Nathan Baxter is a 72 y.o. male.  PMH glucose intolerance.  HTN.  HLD.  Infrarenal AAA.   Colonoscopy in or before 2003 in ([place].    Admission with, presumed biliary, acute pancreatitis 2/28 - 05/08/2017.  Lipase 3800.  T bili 2.  Alk phos 72.  AST/ALT 469/272.  CT: no gallstones or ductal dil.  Normal looking liver, pancreas.  Posterior gastric wall thickening, and small fluid posterior to D2 concerning for PUD.  3.3 cm infrarenal AAA: follow with [place] up in 3 yrs.   Ultrasound: no biliary, GB pathology.  Poor vis of pancreas.  4.8 mm CBD.   MRCP:  Cholelithiasis.  No cholecystitis.  Inflammation in head and prox body of pancreas.  Hepatic steatosis.  Moderate, dependant, right pleural effusion.     Underwent lap chole 3/4 with Dr Barry Dienes.  Friable GB and cystic duct.  Friable cystic artery. Some LOA.  Due to friability of infundibulum, surgeon elected not to perform IOC.   LFTs at discharge 1.8, 79, 49/79.  Lipase 29. Has upcoming GI ROV with NP Guenther on 3/28.  New RX of lopressor and amlodipine for htn at d/c.  Never felt well after d/c.  Wave like pain in LLQ sometimes radiating across lower abdomen.  Stools watery diarrhea >> formed stools but never bloody/black.  Not much better after simethicone, Tramadol.  Bloating/distention.  All sxs >> after PO so not eating a lot. sxs >> with movement.  No nausea initially but queasy yesterday.  No fever, sweats, chills.   Not tachy, nor hypotensive in ED yesterday.  Lipase 124. T bili 2.3.  Alk phos 308.  AST/ALT 59/62.   WBCs 12.9.  CT with contrast: fatty liver, persistent inflammation about the pancreatic head and body.  New abdominal ascites.  New (hematoma?) density medial to right kidney. Stable 3.3 cm AAA.   No bleeding, no ETOH (2 glasses wine at xmas was last drink), no tobacco.  Lives alone, hasn't yet established with a PMD.  Fm Hx neg for colon cancer, GB issues, pancreatic dz.           Past Medical History:  Diagnosis Date  . Acute biliary pancreatitis   . Aneurysm of infrarenal abdominal aorta (HCC)   . Cholecystitis   . HLD (hyperlipidemia)   . HTN (hypertension)   . Prediabetes     Past Surgical History:  Procedure Laterality Date  . CHOLECYSTECTOMY N/A 05/06/2017   Procedure: LAPAROSCOPIC CHOLECYSTECTOMY;  Surgeon: Stark Klein, MD;  Location: [facility];  Service: General;  Laterality: N/A;    Prior to Admission medications   Medication Sig Start Date End Date Taking? Authorizing Provider  amLODipine (NORVASC) 10 MG tablet Take 1 tablet (10 mg total) by mouth daily. 05/09/17  Yes Eugenie Filler, MD  metoprolol tartrate (LOPRESSOR) 25 MG tablet Take 0.5 tablets (12.5 mg total) by mouth 2 (two) times daily. 05/08/17  Yes  Eugenie Filler, MD  simethicone [facility]) 80 MG chewable tablet Chew 2 tablets (160 mg total) by mouth 4 (four) times daily as needed for flatulence. 05/08/17  Yes Eugenie Filler, MD  traMADol (ULTRAM) 50 MG tablet Take 1 tablet (50 mg total) by mouth every 6 (six) hours as needed for moderate pain. 05/08/17  Yes Eugenie Filler, MD    Scheduled Meds: . iopamidol      . sodium chloride flush  3 mL Intravenous Q12H   Infusions: . sodium chloride     Followed by  . sodium chloride    . piperacillin-tazobactam    . piperacillin-tazobactam (ZOSYN)  IV     PRN Meds: acetaminophen **OR** acetaminophen, HYDROmorphone (DILAUDID) injection, ondansetron (ZOFRAN) IV **OR** promethazine   Allergies as of 05/21/2017 - Review Complete 05/21/2017  Allergen Reaction  Noted  . Sulfa antibiotics  05/02/2017    Family History  Problem Relation Age of Onset  . Breast cancer Mother   . Hypertension Father   . Hypertension Sister     Social History   Socioeconomic History  . Marital status: Single    Spouse name: Not on file  . Number of children: Not on file  . Years of education: Not on file  . Highest education level: Not on file  Social Needs  . Financial resource strain: Not on file  . Food insecurity - worry: Not on file  . Food insecurity - inability: Not on file  . Transportation needs - medical: Not on file  . Transportation needs - non-medical: Not on file  Occupational History  . Not on file  Tobacco Use  . Smoking status: Former Research scientist (life sciences)  . Smokeless tobacco: Never Used  Substance and Sexual Activity  . Alcohol use: Yes    Frequency: Never  . Drug use: No  . Sexual activity: Not on file  Other Topics Concern  . Not on file  Social History Narrative  . Not on file    REVIEW OF SYSTEMS: Constitutional:  Tired.  Has not returned to baseline activity ENT:  No nose bleeds Pulm: Denies shortness of breath and cough. CV:  No palpitations, no LE edema.  No chest pain.   GU: Only dysuria.  Urine looks darker than normal.  No hematuria, no frequency GI:  Per HPI Heme: Denies unusual bleeding or bruising.  Has never had to take iron supplements. Transfusions: No prior blood transfusions. Neuro:  No headaches, no peripheral tingling or numbness.  No presyncope. Derm:  No itching, no rash or sores.  Endocrine:  No sweats or chills.  No polyuria or dysuria Immunization: Does not believe in preventive vaccinations.  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 Travel:  None beyond local counties in last few months.    PHYSICAL EXAM: Vital signs in last 24 hours: Vitals:   05/22/17 0815 05/22/17 0959  BP: 125/86 134/84  Pulse: 89 88  Resp: 18 18  Temp:  98.7 F (37.1 C)  SpO2:  93% 97%   Wt Readings from Last 3 Encounters:  05/21/17 218 lb (98.9 kg)  05/02/17 244 lb (110.7 kg)    General: Mildly ill, mildly uncomfortable overweight WM Head: No facial asymmetry or swelling.  No signs of head trauma. Eyes: No conjunctival pallor.  No scleral icterus.  EOMI. Ears: Slightly HOH Nose: No discharge or congestion. Mouth: Slightly dry oral mucosa.  Full dentures above.  Lower teeth in fair to poor condition. Neck: No JVD, no masses, no thyromegaly. Lungs: A few fine rales in the left base Heart: RRR.  No MRG.  S1, S2 present. Abdomen: Protuberant/distended but soft.  Tender in the left lower and left mid abdomen.  No guarding or rebound.  Bowel sounds quiet and no tinkling or tympanitic sounds.  Surgical scars CDI. Rectal: Deferred Musc/Skeltl: No joint redness, swelling or significant deformity. Extremities: No CCE.  Feet are warm and well perfused. Neurologic: Alert.  Oriented x3.  Moves all 4 limbs, limb strength not tested.  No tremors.  No obvious, gross deficits. Skin: No telangiectasia, no purpura or bruising. Tattoos: None observed. Nodes: No cervical adenopathy. Psych: Calm, pleasant, cooperative.  Intake/Output from previous day: 03/19 0701 - 03/20 0700 In: 2000 [I.V.:1000; IV Piggyback:1000] Out: -  Intake/Output this shift: No intake/output data recorded.  LAB RESULTS: Recent Labs    05/21/17 1611 05/22/17 0716  WBC 12.2* 12.9*  HGB 14.8 13.7  HCT 43.5 40.6  PLT 648* 441*   BMET Lab Results  Component Value Date   NA 135 05/22/2017   NA 136 05/21/2017   NA 142 05/08/2017   K 4.4 05/22/2017   K 4.4 05/21/2017   K 3.1 (L) 05/08/2017   CL 104 05/22/2017   CL 100 (L) 05/21/2017   CL 106 05/08/2017   CO2 22 05/22/2017   CO2 24 05/21/2017   CO2 24 05/08/2017   GLUCOSE 142 (H) 05/22/2017   GLUCOSE 145 (H) 05/21/2017   GLUCOSE 141 (H) 05/08/2017   BUN 18 05/22/2017   BUN 18 05/21/2017   BUN 17 05/08/2017   CREATININE 0.94  05/22/2017   CREATININE 1.20 05/21/2017   CREATININE 0.59 (L) 05/08/2017   CALCIUM 8.1 (L) 05/22/2017   CALCIUM 9.0 05/21/2017   CALCIUM 8.5 (L) 05/08/2017   LFT Recent Labs    05/21/17 1611 05/22/17 0716  PROT 7.2 5.8*  ALBUMIN 2.8* 2.2*  AST 59* 49*  ALT 62 46  ALKPHOS 308* 256*  BILITOT 2.3* 2.2*   PT/INR No results found for: INR, PROTIME Hepatitis Panel No results for input(s): HEPBSAG, HCVAB, HEPAIGM, HEPBIGM in the last 72 hours. C-Diff No components found for: CDIFF Lipase     Component Value Date/Time   LIPASE 113 (H) 05/22/2017 0716   INR normal  Drugs of Abuse  No results found for: LABOPIA, COCAINSCRNUR, LABBENZ, AMPHETMU, THCU, LABBARB   RADIOLOGY STUDIES: Ct Abdomen Pelvis W Contrast  Result Date: 05/22/2017 CLINICAL DATA:  Abdominal pain and distension. Diarrhea. Cholecystectomy 16 days prior. EXAM: CT ABDOMEN AND PELVIS WITH CONTRAST TECHNIQUE: Multidetector CT imaging of the abdomen and pelvis was performed using the standard protocol following bolus administration of intravenous contrast. CONTRAST:  144m ISOVUE-300 IOPAMIDOL (ISOVUE-300) INJECTION 61% COMPARISON:  CT 05/02/2017.  MRI 05/04/2017 FINDINGS: Lower chest: Linear atelectasis in both lower lobes. Small right pleural effusion. There are coronary artery calcifications. Hepatobiliary: Decreased hepatic density consistent with steatosis. No focal hepatic lesion. Interval cholecystectomy from prior imaging. Small amount of free fluid in the gallbladder fossa image 33 series 3. More focal rounded fluid proximal to cholecystectomy clips measuring 1.4 x 2.3 cm may be a cystic duct remnant or postoperative seroma. No internal air. No biliary dilatation. Pancreas: Peripancreatic fat stranding and free fluid about the head and body of the pancreas. No ductal dilatation. No organized peripancreatic collection. Spleen: Normal in size without focal abnormality. Adrenals/Urinary Tract: Normal adrenal glands. No  hydronephrosis or perinephric edema. Homogeneous  renal enhancement with symmetric excretion on delayed phase imaging. Medial to the right kidney is a 2.9 x 2.8 cm heterogeneous low-density structure that is new from prior exam. This appears separate from the right kidney and is adjacent to the renal artery. No surrounding fat stranding. Urinary bladder is non distended without wall thickening. Stomach/Bowel: Stomach physiologically distended. No gastric wall thickening. Presence of intra-abdominal ascites partially limits bowel evaluation. Allowing for this, no evidence of bowel wall thickening, obstruction or inflammatory change. Administered enteric contrast reaches distal small bowel. Appendix is normal. Vascular/Lymphatic: 3.3 cm infrarenal abdominal aortic aneurysm, unchanged. Moderate aortic atherosclerosis. Limited assessment for adenopathy given presence of ascites. Reproductive: Prostate is unremarkable. Other: Moderate abdominopelvic ascites is new from prior exam. Fluid measures simple fluid density. No free air. No evidence of intra-abdominal abscess. Small fat containing umbilical hernia. Musculoskeletal: Stable degenerative change in the lumbar spine with endplate irregularity at L3-L4. There are no acute or suspicious osseous abnormalities. IMPRESSION: 1. Moderate volume abdominopelvic ascites, new from prior exam. 2. Persistent peripancreatic fat stranding about the head and body, with small amount peripancreatic fluid, suspicious for pancreatitis. No focal fluid collection. 3. Small fluid density structure adjacent to surgical clips in the gallbladder fossa may be a cystic duct remnant or postoperative seroma. No surrounding inflammation or internal air to suggest abscess. 4. A 2.9 x 2.8 cm slightly heterogeneous structure medial to the right kidney is new from prior exam, suggesting hematoma. No active bleeding. 5. Chronic findings include hepatic steatosis and infrarenal abdominal aortic aneurysm  with maximal dimension 3.3 cm. Electronically Signed   By: Jeb Levering M.D.   On: 05/22/2017 04:05    IMPRESSION:   *   Ongoing abdominal pain, bloating, diarrhea 16 days post laparoscopic cholecystectomy. Lipase again elevated, higher than at discharge, not as high as at presentation 3 weeks ago. LFTs noteable for new elevation alk phos; recurrent T bili and stable mild AST/ALT elevation. New findings on CT include abdominal pelvic ascites, possible postoperative seroma, possible right kidney hematoma.  Persistent, stable findings include changes suspicious for pancreatitis in the head and body,  infrarenal AAA, fatty liver.  These findings point to several possible etiologies of his ongoing symptoms.   Need to rule out biloma/bile leak, choledocholithiasis (though CBD not dilated on CT scan so this is less likely). MRI/MRCP ordered.   Watery diarrhea may simply be post cholecystectomy bile acid diarrhea but given that he certainly had at least 1 or 2 exposures to antibiotics during hospitalization, need to rule out C. Difficile.   *  DM2/glucose intolerance.  Used Metformin in past.        PLAN:     *   Spoke with Dr Loletha Carrow and surgical PA [place].  Consensus is to skip MRCP for now (I cancelled it) and perform HIDA to assess for bile leak.   C diff study ordered but not collected yet.  Zosyn in place. IVF of NS in place at 200 ml/hour.   *  At MD request, scheduled ERCP for 11:30 tomorrow, in case it is needed.  Ordering PT/INR for this evening to r/o coagulopathy.  Pt is wearing PAS hose, not receiving thinners.         Azucena Freed  05/22/2017, 10:19 AM Pager: [phone]  * addendum 3:15 PM:  Nuc med will be calling on-call staff to perform HIDA tonight.  Do not want to delay this until tomorrow.  PO status not an issue for bile leak study HIDA so  can have clears.   ___________________________________________________________________________  GI Attending Addendum:  I have  reviewed the entire case in detail with the above APP and discussed the plan in detail.  Therefore, I agree with the diagnoses recorded above. In addition,  I have personally interviewed the patient and have personally reviewed any abdominal/pelvic CT scan images.  My additional thoughts are as follows:  This patient has ongoing abdominal pain after his recent difficult cholecystectomy.  I read the operative note, indicating an partially intrahepatic gallbladder with only partial resection due to inflammation.  He has ongoing pain and elevated LFTs, and I agree there is some concern about possible retained common bile duct stone.  Preop MRCP last admission did not show any CBD stone(s), but that does not rule it out at this time.LFTs nearly normalized at time of recent discharge except for T bili 1.8, but now significantly elevated Alk Phos and mildly elevated T bili. I am also concerned about the significant chance of a bile leak in this case.  Although his imaging report describes only a small fluid collection in the gallbladder fossa, the patient has a large amount of ascites around the liver and in the remainder of the abdominal cavity, and there was absolutely no ascites on his CT scan about 4 weeks ago.  He has no history of liver disease or other known reason why he should have developed ascites.  As such, the HIDA scan is essential to better understand the overall scenario.  We have canceled the MRCP because I think ERCP would be the next step.  If the HIDA scan shows bile leak, then he needs the ERCP for ductal clearance and stent placement.  If the HIDA scan shows no bile leak, then he still needs the ERCP given a high clinical suspicion for retained CBD stones.  I am not certain we would be able to get a good quality MR study given the degree of fluid and inflammation around the gallbladder fossa.  I do not think he has significant pancreatitis at this point with a lipase of 114, but perhaps we  were only catching him a few days into the clinical course with the lipase on the decline.      I have spoken with Nathan Baxter at length about these plans and he understands.  HIDA scan pending and ERCP is tentatively planned for tomorrow AM. I also spoke to Dr. Georganna Skeans of the surgical service.  If the HIDA scan shows a bile leak, he does not feel that IR drain or repeat operation would be needed before ERCP.    Nelida Meuse III Pager [phone]  Mon-Fri 8a-5p [phone] after 5p, weekends, holidays

## 2017-05-22 NOTE — Consult Note (Addendum)
[facility], [facility]: 10:19 AM 05/22/2017  LOS: 0 days    Referring Provider: Dr Hal Hope  Primary Care Physician:  No Pcp Primary Gastroenterologist:  Seen inpt 3/1 by Dr Silverio Decamp.      Reason for Consultation: upper abdominal pain and elevated LFTs   HPI: Dvid Pendry is a 72 y.o. male.  PMH glucose intolerance.  HTN.  HLD.  Infrarenal AAA.   Colonoscopy in or before 2003 in ([place].    Admission with, presumed biliary, acute pancreatitis 2/28 - 05/08/2017.  Lipase 3800.  T bili 2.  Alk phos 72.  AST/ALT 469/272.  CT: no gallstones or ductal dil.  Normal looking liver, pancreas.  Posterior gastric wall thickening, and small fluid posterior to D2 concerning for PUD.  3.3 cm infrarenal AAA: follow with [place] up in 3 yrs.   Ultrasound: no biliary, GB pathology.  Poor vis of pancreas.  4.8 mm CBD.   MRCP:  Cholelithiasis.  No cholecystitis.  Inflammation in head and prox body of pancreas.  Hepatic steatosis.  Moderate, dependant, right pleural effusion.     Underwent lap chole 3/4 with Dr Barry Dienes.  Friable GB and cystic duct.  Friable cystic artery. Some LOA.  Due to friability of infundibulum, surgeon elected not to perform IOC.   LFTs at discharge 1.8, 79, 49/79.  Lipase 29. Has upcoming GI ROV with NP Guenther on 3/28.  New RX of lopressor and amlodipine for htn at d/c.  Never felt well after d/c.  Wave like pain in LLQ sometimes radiating across lower abdomen.  Stools watery diarrhea >> formed stools but never bloody/black.  Not much better after simethicone, Tramadol.  Bloating/distention.  All sxs >> after PO so not eating a lot. sxs >> with movement.  No nausea initially but queasy yesterday.  No fever, sweats, chills.   Not tachy, nor hypotensive in ED yesterday.  Lipase 124. T bili 2.3.  Alk phos 308.  AST/ALT 59/62.   WBCs 12.9.  CT with contrast: fatty liver, persistent inflammation about the pancreatic head and body.  New abdominal ascites.  New (hematoma?) density medial to right kidney. Stable 3.3 cm AAA.   No bleeding, no ETOH (2 glasses wine at xmas was last drink), no tobacco.  Lives alone, hasn't yet established with a PMD.  Fm Hx neg for colon cancer, GB issues, pancreatic dz.           Past Medical History:  Diagnosis Date  . Acute biliary pancreatitis   . Aneurysm of infrarenal abdominal aorta (HCC)   . Cholecystitis   . HLD (hyperlipidemia)   . HTN (hypertension)   . Prediabetes     Past Surgical History:  Procedure Laterality Date  . CHOLECYSTECTOMY N/A 05/06/2017   Procedure: LAPAROSCOPIC CHOLECYSTECTOMY;  Surgeon: Stark Klein, MD;  Location: [facility];  Service: General;  Laterality: N/A;    Prior to Admission medications   Medication Sig Start Date End Date Taking? Authorizing Provider  amLODipine (NORVASC) 10 MG tablet Take 1 tablet (10 mg total) by mouth daily. 05/09/17  Yes Eugenie Filler, MD  metoprolol tartrate (LOPRESSOR) 25 MG tablet Take 0.5 tablets (12.5 mg total) by mouth 2 (two) times daily. 05/08/17  Yes  Eugenie Filler, MD  simethicone [facility]) 80 MG chewable tablet Chew 2 tablets (160 mg total) by mouth 4 (four) times daily as needed for flatulence. 05/08/17  Yes Eugenie Filler, MD  traMADol (ULTRAM) 50 MG tablet Take 1 tablet (50 mg total) by mouth every 6 (six) hours as needed for moderate pain. 05/08/17  Yes Eugenie Filler, MD    Scheduled Meds: . iopamidol      . sodium chloride flush  3 mL Intravenous Q12H   Infusions: . sodium chloride     Followed by  . sodium chloride    . piperacillin-tazobactam    . piperacillin-tazobactam (ZOSYN)  IV     PRN Meds: acetaminophen **OR** acetaminophen, HYDROmorphone (DILAUDID) injection, ondansetron (ZOFRAN) IV **OR** promethazine   Allergies as of 05/21/2017 - Review Complete 05/21/2017  Allergen Reaction  Noted  . Sulfa antibiotics  05/02/2017    Family History  Problem Relation Age of Onset  . Breast cancer Mother   . Hypertension Father   . Hypertension Sister     Social History   Socioeconomic History  . Marital status: Single    Spouse name: Not on file  . Number of children: Not on file  . Years of education: Not on file  . Highest education level: Not on file  Social Needs  . Financial resource strain: Not on file  . Food insecurity - worry: Not on file  . Food insecurity - inability: Not on file  . Transportation needs - medical: Not on file  . Transportation needs - non-medical: Not on file  Occupational History  . Not on file  Tobacco Use  . Smoking status: Former Research scientist (life sciences)  . Smokeless tobacco: Never Used  Substance and Sexual Activity  . Alcohol use: Yes    Frequency: Never  . Drug use: No  . Sexual activity: Not on file  Other Topics Concern  . Not on file  Social History Narrative  . Not on file    REVIEW OF SYSTEMS: Constitutional:  Tired.  Has not returned to baseline activity ENT:  No nose bleeds Pulm: Denies shortness of breath and cough. CV:  No palpitations, no LE edema.  No chest pain.   GU: Only dysuria.  Urine looks darker than normal.  No hematuria, no frequency GI:  Per HPI Heme: Denies unusual bleeding or bruising.  Has never had to take iron supplements. Transfusions: No prior blood transfusions. Neuro:  No headaches, no peripheral tingling or numbness.  No presyncope. Derm:  No itching, no rash or sores.  Endocrine:  No sweats or chills.  No polyuria or dysuria Immunization: Does not believe in preventive vaccinations.  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 Travel:  None beyond local counties in last few months.    PHYSICAL EXAM: Vital signs in last 24 hours: Vitals:   05/22/17 0815 05/22/17 0959  BP: 125/86 134/84  Pulse: 89 88  Resp: 18 18  Temp:  98.7 F (37.1 C)  SpO2:  93% 97%   Wt Readings from Last 3 Encounters:  05/21/17 218 lb (98.9 kg)  05/02/17 244 lb (110.7 kg)    General: Mildly ill, mildly uncomfortable overweight WM Head: No facial asymmetry or swelling.  No signs of head trauma. Eyes: No conjunctival pallor.  No scleral icterus.  EOMI. Ears: Slightly HOH Nose: No discharge or congestion. Mouth: Slightly dry oral mucosa.  Full dentures above.  Lower teeth in fair to poor condition. Neck: No JVD, no masses, no thyromegaly. Lungs: A few fine rales in the left base Heart: RRR.  No MRG.  S1, S2 present. Abdomen: Protuberant/distended but soft.  Tender in the left lower and left mid abdomen.  No guarding or rebound.  Bowel sounds quiet and no tinkling or tympanitic sounds.  Surgical scars CDI. Rectal: Deferred Musc/Skeltl: No joint redness, swelling or significant deformity. Extremities: No CCE.  Feet are warm and well perfused. Neurologic: Alert.  Oriented x3.  Moves all 4 limbs, limb strength not tested.  No tremors.  No obvious, gross deficits. Skin: No telangiectasia, no purpura or bruising. Tattoos: None observed. Nodes: No cervical adenopathy. Psych: Calm, pleasant, cooperative.  Intake/Output from previous day: 03/19 0701 - 03/20 0700 In: 2000 [I.V.:1000; IV Piggyback:1000] Out: -  Intake/Output this shift: No intake/output data recorded.  LAB RESULTS: Recent Labs    05/21/17 1611 05/22/17 0716  WBC 12.2* 12.9*  HGB 14.8 13.7  HCT 43.5 40.6  PLT 648* 441*   BMET Lab Results  Component Value Date   NA 135 05/22/2017   NA 136 05/21/2017   NA 142 05/08/2017   K 4.4 05/22/2017   K 4.4 05/21/2017   K 3.1 (L) 05/08/2017   CL 104 05/22/2017   CL 100 (L) 05/21/2017   CL 106 05/08/2017   CO2 22 05/22/2017   CO2 24 05/21/2017   CO2 24 05/08/2017   GLUCOSE 142 (H) 05/22/2017   GLUCOSE 145 (H) 05/21/2017   GLUCOSE 141 (H) 05/08/2017   BUN 18 05/22/2017   BUN 18 05/21/2017   BUN 17 05/08/2017   CREATININE 0.94  05/22/2017   CREATININE 1.20 05/21/2017   CREATININE 0.59 (L) 05/08/2017   CALCIUM 8.1 (L) 05/22/2017   CALCIUM 9.0 05/21/2017   CALCIUM 8.5 (L) 05/08/2017   LFT Recent Labs    05/21/17 1611 05/22/17 0716  PROT 7.2 5.8*  ALBUMIN 2.8* 2.2*  AST 59* 49*  ALT 62 46  ALKPHOS 308* 256*  BILITOT 2.3* 2.2*   PT/INR No results found for: INR, PROTIME Hepatitis Panel No results for input(s): HEPBSAG, HCVAB, HEPAIGM, HEPBIGM in the last 72 hours. C-Diff No components found for: CDIFF Lipase     Component Value Date/Time   LIPASE 113 (H) 05/22/2017 0716   INR normal  Drugs of Abuse  No results found for: LABOPIA, COCAINSCRNUR, LABBENZ, AMPHETMU, THCU, LABBARB   RADIOLOGY STUDIES: Ct Abdomen Pelvis W Contrast  Result Date: 05/22/2017 CLINICAL DATA:  Abdominal pain and distension. Diarrhea. Cholecystectomy 16 days prior. EXAM: CT ABDOMEN AND PELVIS WITH CONTRAST TECHNIQUE: Multidetector CT imaging of the abdomen and pelvis was performed using the standard protocol following bolus administration of intravenous contrast. CONTRAST:  141m ISOVUE-300 IOPAMIDOL (ISOVUE-300) INJECTION 61% COMPARISON:  CT 05/02/2017.  MRI 05/04/2017 FINDINGS: Lower chest: Linear atelectasis in both lower lobes. Small right pleural effusion. There are coronary artery calcifications. Hepatobiliary: Decreased hepatic density consistent with steatosis. No focal hepatic lesion. Interval cholecystectomy from prior imaging. Small amount of free fluid in the gallbladder fossa image 33 series 3. More focal rounded fluid proximal to cholecystectomy clips measuring 1.4 x 2.3 cm may be a cystic duct remnant or postoperative seroma. No internal air. No biliary dilatation. Pancreas: Peripancreatic fat stranding and free fluid about the head and body of the pancreas. No ductal dilatation. No organized peripancreatic collection. Spleen: Normal in size without focal abnormality. Adrenals/Urinary Tract: Normal adrenal glands. No  hydronephrosis or perinephric edema. Homogeneous  renal enhancement with symmetric excretion on delayed phase imaging. Medial to the right kidney is a 2.9 x 2.8 cm heterogeneous low-density structure that is new from prior exam. This appears separate from the right kidney and is adjacent to the renal artery. No surrounding fat stranding. Urinary bladder is non distended without wall thickening. Stomach/Bowel: Stomach physiologically distended. No gastric wall thickening. Presence of intra-abdominal ascites partially limits bowel evaluation. Allowing for this, no evidence of bowel wall thickening, obstruction or inflammatory change. Administered enteric contrast reaches distal small bowel. Appendix is normal. Vascular/Lymphatic: 3.3 cm infrarenal abdominal aortic aneurysm, unchanged. Moderate aortic atherosclerosis. Limited assessment for adenopathy given presence of ascites. Reproductive: Prostate is unremarkable. Other: Moderate abdominopelvic ascites is new from prior exam. Fluid measures simple fluid density. No free air. No evidence of intra-abdominal abscess. Small fat containing umbilical hernia. Musculoskeletal: Stable degenerative change in the lumbar spine with endplate irregularity at L3-L4. There are no acute or suspicious osseous abnormalities. IMPRESSION: 1. Moderate volume abdominopelvic ascites, new from prior exam. 2. Persistent peripancreatic fat stranding about the head and body, with small amount peripancreatic fluid, suspicious for pancreatitis. No focal fluid collection. 3. Small fluid density structure adjacent to surgical clips in the gallbladder fossa may be a cystic duct remnant or postoperative seroma. No surrounding inflammation or internal air to suggest abscess. 4. A 2.9 x 2.8 cm slightly heterogeneous structure medial to the right kidney is new from prior exam, suggesting hematoma. No active bleeding. 5. Chronic findings include hepatic steatosis and infrarenal abdominal aortic aneurysm  with maximal dimension 3.3 cm. Electronically Signed   By: Jeb Levering M.D.   On: 05/22/2017 04:05    IMPRESSION:   *   Ongoing abdominal pain, bloating, diarrhea 16 days post laparoscopic cholecystectomy. Lipase again elevated, higher than at discharge, not as high as at presentation 3 weeks ago. LFTs noteable for new elevation alk phos; recurrent T bili and stable mild AST/ALT elevation. New findings on CT include abdominal pelvic ascites, possible postoperative seroma, possible right kidney hematoma.  Persistent, stable findings include changes suspicious for pancreatitis in the head and body,  infrarenal AAA, fatty liver.  These findings point to several possible etiologies of his ongoing symptoms.   Need to rule out biloma/bile leak, choledocholithiasis (though CBD not dilated on CT scan so this is less likely). MRI/MRCP ordered.   Watery diarrhea may simply be post cholecystectomy bile acid diarrhea but given that he certainly had at least 1 or 2 exposures to antibiotics during hospitalization, need to rule out C. Difficile.   *  DM2/glucose intolerance.  Used Metformin in past.        PLAN:     *   Spoke with Dr Loletha Carrow and surgical PA [place].  Consensus is to skip MRCP for now (I cancelled it) and perform HIDA to assess for bile leak.   C diff study ordered but not collected yet.  Zosyn in place. IVF of NS in place at 200 ml/hour.   *  At MD request, scheduled ERCP for 11:30 tomorrow, in case it is needed.  Ordering PT/INR for this evening to r/o coagulopathy.  Pt is wearing PAS hose, not receiving thinners.         Azucena Freed  05/22/2017, 10:19 AM Pager: [phone]  * addendum 3:15 PM:  Nuc med will be calling on-call staff to perform HIDA tonight.  Do not want to delay this until tomorrow.  PO status not an issue for bile leak study HIDA so  can have clears.   ___________________________________________________________________________  GI Attending Addendum:  I have  reviewed the entire case in detail with the above APP and discussed the plan in detail.  Therefore, I agree with the diagnoses recorded above. In addition,  I have personally interviewed the patient and have personally reviewed any abdominal/pelvic CT scan images.  My additional thoughts are as follows:  This patient has ongoing abdominal pain after his recent difficult cholecystectomy.  I read the operative note, indicating an partially intrahepatic gallbladder with only partial resection due to inflammation.  He has ongoing pain and elevated LFTs, and I agree there is some concern about possible retained common bile duct stone.  Preop MRCP last admission did not show any CBD stone(s), but that does not rule it out at this time.LFTs nearly normalized at time of recent discharge except for T bili 1.8, but now significantly elevated Alk Phos and mildly elevated T bili. I am also concerned about the significant chance of a bile leak in this case.  Although his imaging report describes only a small fluid collection in the gallbladder fossa, the patient has a large amount of ascites around the liver and in the remainder of the abdominal cavity, and there was absolutely no ascites on his CT scan about 4 weeks ago.  He has no history of liver disease or other known reason why he should have developed ascites.  As such, the HIDA scan is essential to better understand the overall scenario.  We have canceled the MRCP because I think ERCP would be the next step.  If the HIDA scan shows bile leak, then he needs the ERCP for ductal clearance and stent placement.  If the HIDA scan shows no bile leak, then he still needs the ERCP given a high clinical suspicion for retained CBD stones.  I am not certain we would be able to get a good quality MR study given the degree of fluid and inflammation around the gallbladder fossa.  I do not think he has significant pancreatitis at this point with a lipase of 114, but perhaps we  were only catching him a few days into the clinical course with the lipase on the decline.      I have spoken with Mr. Elison at length about these plans and he understands.  HIDA scan pending and ERCP is tentatively planned for tomorrow AM. I also spoke to Dr. Georganna Skeans of the surgical service.  If the HIDA scan shows a bile leak, he does not feel that IR drain or repeat operation would be needed before ERCP.    Nelida Meuse III Pager [phone]  Mon-Fri 8a-5p [phone] after 5p, weekends, holidays

## 2017-05-22 NOTE — ED Notes (Signed)
Patient transported to CT 

## 2017-05-22 NOTE — ED Provider Notes (Signed)
Patient signed out to me by Dr. Juleen ChinaKohut pending CT of abdomen and pelvis.  CT shows signs of pancreatitis which matches clinically with his presentation and with elevated lipase noted on labs.  Also, small perinephric hematoma which is probably not clinically significant.  He did require additional narcotics for pain relief.  I do not feel he is stable for discharge.  Case is discussed with Dr. Toniann FailKakrakandy of [facility], who agrees to admit the patient.  Results for orders placed or performed during the hospital encounter of 05/21/17  Lipase, blood  Result Value Ref Range   Lipase 124 (H) 11 - 51 U/L  Comprehensive metabolic panel  Result Value Ref Range   Sodium 136 135 - 145 mmol/L   Potassium 4.4 3.5 - 5.1 mmol/L   Chloride 100 (L) 101 - 111 mmol/L   CO2 24 22 - 32 mmol/L   Glucose, Bld 145 (H) 65 - 99 mg/dL   BUN 18 6 - 20 mg/dL   Creatinine, Ser 2.951.20 0.61 - 1.24 mg/dL   Calcium 9.0 8.9 - 62.110.3 mg/dL   Total Protein 7.2 6.5 - 8.1 g/dL   Albumin 2.8 (L) 3.5 - 5.0 g/dL   AST 59 (H) 15 - 41 U/L   ALT 62 17 - 63 U/L   Alkaline Phosphatase 308 (H) 38 - 126 U/L   Total Bilirubin 2.3 (H) 0.3 - 1.2 mg/dL   GFR calc non Af Amer 59 (L) >60 mL/min   GFR calc Af Amer >60 >60 mL/min   Anion gap 12 5 - 15  CBC  Result Value Ref Range   WBC 12.2 (H) 4.0 - 10.5 K/uL   RBC 5.09 4.22 - 5.81 MIL/uL   Hemoglobin 14.8 13.0 - 17.0 g/dL   HCT 30.843.5 65.739.0 - 84.652.0 %   MCV 85.5 78.0 - 100.0 fL   MCH 29.1 26.0 - 34.0 pg   MCHC 34.0 30.0 - 36.0 g/dL   RDW 96.213.0 95.211.5 - 84.115.5 %   Platelets 648 (H) 150 - 400 K/uL  Urinalysis, Routine w reflex microscopic  Result Value Ref Range   Color, Urine AMBER (A) YELLOW   APPearance CLOUDY (A) CLEAR   Specific Gravity, Urine 1.028 1.005 - 1.030   pH 5.0 5.0 - 8.0   Glucose, UA 50 (A) NEGATIVE mg/dL   Hgb urine dipstick NEGATIVE NEGATIVE   Bilirubin Urine MODERATE (A) NEGATIVE   Ketones, ur 20 (A) NEGATIVE mg/dL   Protein, ur 324100 (A) NEGATIVE mg/dL   Nitrite  NEGATIVE NEGATIVE   Leukocytes, UA NEGATIVE NEGATIVE   RBC / HPF 6-30 0 - 5 RBC/hpf   WBC, UA 6-30 0 - 5 WBC/hpf   Bacteria, UA NONE SEEN NONE SEEN   Squamous Epithelial / LPF 0-5 (A) NONE SEEN   Mucus PRESENT    Hyaline Casts, UA PRESENT    [place] Abdomen Complete  Result Date: 05/03/2017 CLINICAL DATA:  72 year old male with history of pancreatitis. EXAM: ABDOMEN ULTRASOUND COMPLETE COMPARISON:  No prior abdominal ultrasound. CT the abdomen and pelvis 05/02/2017. FINDINGS: Gallbladder: No gallstones or wall thickening visualized. No sonographic Murphy sign noted by sonographer. Common bile duct: Diameter: 4.8 mm Liver: No focal lesion identified. Within normal limits in parenchymal echogenicity. Portal vein is patent on color Doppler imaging with normal direction of blood flow towards the liver. IVC: No abnormality visualized. Pancreas: Poorly visualized. Spleen: Size and appearance within normal limits. Right Kidney: Length: 12.1 cm. Echogenicity within normal limits. No mass or hydronephrosis visualized.  Left Kidney: Length: 12.2 cm. Echogenicity within normal limits. No mass or hydronephrosis visualized. Abdominal aorta: Fusiform dilatation of the infrarenal abdominal aorta measuring up to 3.1 cm in diameter. Other findings: None. IMPRESSION: 1. Pancreas is poorly visualized on today's examination secondary to overlying bowel gas. 2. No acute findings. 3. Small infrarenal abdominal aortic aneurysm measuring 3.1 cm in diameter. Recommend followup by ultrasound in 3 years. This recommendation follows ACR consensus guidelines: White Paper of the ACR Incidental Findings Committee II on Vascular Findings. J Am Coll Radiol 2013; 10:789-794. Electronically Signed   By: Trudie Reed M.D.   On: 05/03/2017 15:38   Ct Abdomen Pelvis W Contrast  Result Date: 05/22/2017 CLINICAL DATA:  Abdominal pain and distension. Diarrhea. Cholecystectomy 16 days prior. EXAM: CT ABDOMEN AND PELVIS WITH CONTRAST TECHNIQUE:  Multidetector CT imaging of the abdomen and pelvis was performed using the standard protocol following bolus administration of intravenous contrast. CONTRAST:  ISOVUE-300 IOPAMIDOL (ISOVUE-300) INJECTION 61% COMPARISON:  CT 05/02/2017.  MRI 05/04/2017 FINDINGS: Lower chest: Linear atelectasis in both lower lobes. Small right pleural effusion. There are coronary artery calcifications. Hepatobiliary: Decreased hepatic density consistent with steatosis. No focal hepatic lesion. Interval cholecystectomy from prior imaging. Small amount of free fluid in the gallbladder fossa image 33 series 3. More focal rounded fluid proximal to cholecystectomy clips measuring 1.4 x 2.3 cm may be a cystic duct remnant or postoperative seroma. No internal air. No biliary dilatation. Pancreas: Peripancreatic fat stranding and free fluid about the head and body of the pancreas. No ductal dilatation. No organized peripancreatic collection. Spleen: Normal in size without focal abnormality. Adrenals/Urinary Tract: Normal adrenal glands. No hydronephrosis or perinephric edema. Homogeneous renal enhancement with symmetric excretion on delayed phase imaging. Medial to the right kidney is a 2.9 x 2.8 cm heterogeneous low-density structure that is new from prior exam. This appears separate from the right kidney and is adjacent to the renal artery. No surrounding fat stranding. Urinary bladder is non distended without wall thickening. Stomach/Bowel: Stomach physiologically distended. No gastric wall thickening. Presence of intra-abdominal ascites partially limits bowel evaluation. Allowing for this, no evidence of bowel wall thickening, obstruction or inflammatory change. Administered enteric contrast reaches distal small bowel. Appendix is normal. Vascular/Lymphatic: 3.3 cm infrarenal abdominal aortic aneurysm, unchanged. Moderate aortic atherosclerosis. Limited assessment for adenopathy given presence of ascites. Reproductive: Prostate is  unremarkable. Other: Moderate abdominopelvic ascites is new from prior exam. Fluid measures simple fluid density. No free air. No evidence of intra-abdominal abscess. Small fat containing umbilical hernia. Musculoskeletal: Stable degenerative change in the lumbar spine with endplate irregularity at L3-L4. There are no acute or suspicious osseous abnormalities. IMPRESSION: 1. Moderate volume abdominopelvic ascites, new from prior exam. 2. Persistent peripancreatic fat stranding about the head and body, with small amount peripancreatic fluid, suspicious for pancreatitis. No focal fluid collection. 3. Small fluid density structure adjacent to surgical clips in the gallbladder fossa may be a cystic duct remnant or postoperative seroma. No surrounding inflammation or internal air to suggest abscess. 4. A 2.9 x 2.8 cm slightly heterogeneous structure medial to the right kidney is new from prior exam, suggesting hematoma. No active bleeding. 5. Chronic findings include hepatic steatosis and infrarenal abdominal aortic aneurysm with maximal dimension 3.3 cm. Electronically Signed   By: Rubye Oaks M.D.   On: 05/22/2017 04:05   Ct Abdomen Pelvis W Contrast  Result Date: 05/02/2017 CLINICAL DATA:  Acute generalized abdominal pain. EXAM: CT ABDOMEN AND PELVIS WITH CONTRAST TECHNIQUE: Multidetector CT  imaging of the abdomen and pelvis was performed using the standard protocol following bolus administration of intravenous contrast. CONTRAST:  ISOVUE-300 IOPAMIDOL (ISOVUE-300) INJECTION 61% COMPARISON:  None. FINDINGS: Lower chest: No acute abnormality. Hepatobiliary: No focal liver abnormality is seen. No gallstones, gallbladder wall thickening, or biliary dilatation. Pancreas: Unremarkable. No pancreatic ductal dilatation or surrounding inflammatory changes. Spleen: Normal in size without focal abnormality. Adrenals/Urinary Tract: Adrenal glands are unremarkable. Kidneys are normal, without renal calculi, focal  lesion, or hydronephrosis. Bladder is unremarkable. Stomach/Bowel: The appendix appears normal. There is no evidence of bowel obstruction. Focal wall thickening and inflammatory changes are seen involving the posterior wall of the gastric body, which may represent peptic ulcer disease. Small amount of fluid is also seen posterior to second portion of duodenum. Vascular/Lymphatic: 3.3 cm infrarenal abdominal aortic aneurysm is noted. Atherosclerosis of abdominal aorta is noted. No adenopathy is noted. Reproductive: Prostate is unremarkable. Other: Small fat containing periumbilical hernia is noted. Musculoskeletal: No acute or significant osseous findings. IMPRESSION: Focal wall thickening and other inflammatory changes are seen along the posterior wall of gastric body concerning for possible peptic ulcer disease. Small amount of fluid is seen posterior to the second portion of duodenum which may be related to peptic ulcer disease is well. 3.3 cm infrarenal abdominal aortic aneurysm. Recommend followup by ultrasound in 3 years. This recommendation follows ACR consensus guidelines: White Paper of the ACR Incidental Findings Committee II on Vascular Findings. J Am Coll Radiol 2013; 10:789-794. Aortic Atherosclerosis (ICD10-I70.0). Electronically Signed   By: Lupita Raider, M.D.   On: 05/02/2017 13:20   Mr Abdomen Mrcp Wo Contrast  Result Date: 05/04/2017 CLINICAL DATA:  72 year old male with history of abnormal liver function tests. EXAM: MRI ABDOMEN WITHOUT CONTRAST  (INCLUDING MRCP) TECHNIQUE: Multiplanar multisequence MR imaging of the abdomen was performed. Heavily T2-weighted images of the biliary and pancreatic ducts were obtained, and three-dimensional MRCP images were rendered by post processing. COMPARISON:  No prior abdominal MRI. CT the abdomen and pelvis 05/02/2017. FINDINGS: Lower chest: Moderate right pleural effusion lying dependently. Increased signal intensity in portions of the right lower lobe  dependently. Hepatobiliary: Diffuse loss of signal intensity throughout the hepatic parenchyma, indicative of hepatic steatosis. No suspicious cystic or solid hepatic lesions noted on today's noncontrast examination. No intra or extrahepatic biliary ductal dilatation on MRCP images. Common bile duct measures 6 mm in the porta hepatis. No filling defects along the common bile duct to suggest choledocholithiasis. Several tiny filling defects within the gallbladder, compatible with small gallstones. No surrounding inflammatory changes to suggest an acute cholecystitis at this time. Pancreas: No definite pancreatic mass appreciated on today's noncontrast examination. Increased T2 signal intensity adjacent to the head and proximal body of the pancreas, concerning for an acute pancreatitis. No pancreatic ductal dilatation noted on MRCP images. No well-defined peripancreatic fluid collections are noted. Spleen:  Unremarkable. Adrenals/Urinary Tract: Unenhanced appearance of the kidneys and bilateral adrenal glands is unremarkable. Stomach/Bowel: No hydroureteronephrosis in the visualized portions of the abdomen. Vascular/Lymphatic: Fusiform aneurysmal dilatation of the infrarenal abdominal aorta measuring 2.9 x 3.5 cm. No lymphadenopathy noted in the abdomen. Other: Increased fluid signal intensity throughout the right side of the retroperitoneum, presumably related to acute pancreatitis. Trace volume of ascites in the right side of the abdomen. Musculoskeletal: No aggressive appearing osseous lesions are noted in the visualized portions of the skeleton. IMPRESSION: 1. No signs of biliary tract obstruction. 2. Cholelithiasis without evidence of acute cholecystitis at this time. 3. Inflammatory  changes adjacent to the head and proximal body of the pancreas concerning for an acute pancreatitis. 4. Moderate right pleural effusion lying dependently with some associated passive subsegmental atelectasis in the right lower lobe.  5. Hepatic steatosis. Electronically Signed   By: Trudie Reed M.D.   On: 05/04/2017 12:19   Mr 3d Recon At Scanner  Result Date: 05/04/2017 CLINICAL DATA:  72 year old male with history of abnormal liver function tests. EXAM: MRI ABDOMEN WITHOUT CONTRAST  (INCLUDING MRCP) TECHNIQUE: Multiplanar multisequence MR imaging of the abdomen was performed. Heavily T2-weighted images of the biliary and pancreatic ducts were obtained, and three-dimensional MRCP images were rendered by post processing. COMPARISON:  No prior abdominal MRI. CT the abdomen and pelvis 05/02/2017. FINDINGS: Lower chest: Moderate right pleural effusion lying dependently. Increased signal intensity in portions of the right lower lobe dependently. Hepatobiliary: Diffuse loss of signal intensity throughout the hepatic parenchyma, indicative of hepatic steatosis. No suspicious cystic or solid hepatic lesions noted on today's noncontrast examination. No intra or extrahepatic biliary ductal dilatation on MRCP images. Common bile duct measures 6 mm in the porta hepatis. No filling defects along the common bile duct to suggest choledocholithiasis. Several tiny filling defects within the gallbladder, compatible with small gallstones. No surrounding inflammatory changes to suggest an acute cholecystitis at this time. Pancreas: No definite pancreatic mass appreciated on today's noncontrast examination. Increased T2 signal intensity adjacent to the head and proximal body of the pancreas, concerning for an acute pancreatitis. No pancreatic ductal dilatation noted on MRCP images. No well-defined peripancreatic fluid collections are noted. Spleen:  Unremarkable. Adrenals/Urinary Tract: Unenhanced appearance of the kidneys and bilateral adrenal glands is unremarkable. Stomach/Bowel: No hydroureteronephrosis in the visualized portions of the abdomen. Vascular/Lymphatic: Fusiform aneurysmal dilatation of the infrarenal abdominal aorta measuring 2.9 x 3.5 cm.  No lymphadenopathy noted in the abdomen. Other: Increased fluid signal intensity throughout the right side of the retroperitoneum, presumably related to acute pancreatitis. Trace volume of ascites in the right side of the abdomen. Musculoskeletal: No aggressive appearing osseous lesions are noted in the visualized portions of the skeleton. IMPRESSION: 1. No signs of biliary tract obstruction. 2. Cholelithiasis without evidence of acute cholecystitis at this time. 3. Inflammatory changes adjacent to the head and proximal body of the pancreas concerning for an acute pancreatitis. 4. Moderate right pleural effusion lying dependently with some associated passive subsegmental atelectasis in the right lower lobe. 5. Hepatic steatosis. Electronically Signed   By: Trudie Reed M.D.   On: 05/04/2017 12:19      Dione Booze, MD 05/22/17 [phone]

## 2017-05-22 NOTE — Consult Note (Addendum)
[facility] Surgery Consult Note  Nathan Baxter 1945-12-23  [id number].    Requesting MD: Karmen Bongo Chief Complaint/Reason for Consult: pancreatitis  HPI:  Nathan Baxter is a 72yo male PMH HTN and HLD with recent admission 05/02/17 for gallstone pancreatitis during which he underwent laparoscopic cholecystectomy 05/06/17 by Dr. Barry Dienes. Unable to perform IOC during surgery due to of the infundibulum. Patient did have a preoperative MRCP that showed no signs of biliary tract obstruction. He was discharged 05/08/17 in good condition. He was seen in our office about 1 week postoperative and found to have an increased alk phos and lipase with persistently elevated bilirubin. He was advised to come to the ED but initially declined because he did not feel too bad.  Patient presented to the ED yesterday complaining of persistent abdominal pain and bloating, nausea, diarrhea, and weakness. States that the pain has been intermittent. Sometimes dull lower abdominal pain with intermittent sharp shooting pain across his abdomen. Some days he had no pain, other days it was consistent throughout the day. Pain not worse with PO intake. Denies fever or chills. He has only had liquids PO since Monday.   In the ED patient had an abdominal CT scan that showed persistent peripancreatic fat stranding about the head and body with small amount peripancreatic fluid suspicious for pancreatitis, moderate volume abdominopelvic ascites, and small fluid density structure adjacent to surgical clips in the gallbladder fossa. Lipase 124, WBC 12.2, AST 59, ALT 62, alk phos 308, total bilirubin 2.3. MRCP pending. General surgery asked to consult.   ROS: Review of Systems  Constitutional: Positive for weight loss.  HENT: Negative.   Eyes: Negative.   Respiratory: Negative.   Cardiovascular: Negative.   Gastrointestinal: Positive for abdominal pain, diarrhea and nausea. Negative for blood in stool, melena and vomiting.   Genitourinary: Negative.   Musculoskeletal: Negative.   Skin: Negative.   Neurological: Positive for weakness.   All systems reviewed and otherwise negative except for as above  Family History  Problem Relation Age of Onset  . Breast cancer Mother   . Hypertension Father   . Hypertension Sister     Past Medical History:  Diagnosis Date  . Acute biliary pancreatitis   . Aneurysm of infrarenal abdominal aorta (HCC)   . Cholecystitis   . HLD (hyperlipidemia)   . HTN (hypertension)   . Prediabetes     Past Surgical History:  Procedure Laterality Date  . CHOLECYSTECTOMY N/A 05/06/2017   Procedure: LAPAROSCOPIC CHOLECYSTECTOMY;  Surgeon: Stark Klein, MD;  Location: [facility] ORS;  Service: General;  Laterality: N/A;    Social History:  reports that he has quit smoking. he has never used smokeless tobacco. He reports that he drinks alcohol. He reports that he does not use drugs.  Allergies:  Allergies  Allergen Reactions  . Sulfa Antibiotics Rash    Medications Prior to Admission  Medication Sig Dispense Refill  . amLODipine (NORVASC) 10 MG tablet Take 1 tablet (10 mg total) by mouth daily. 30 tablet 0  . metoprolol tartrate (LOPRESSOR) 25 MG tablet Take 0.5 tablets (12.5 mg total) by mouth 2 (two) times daily. 60 tablet 0  . simethicone (MYLICON) 80 MG chewable tablet Chew 2 tablets (160 mg total) by mouth 4 (four) times daily as needed for flatulence. 30 tablet 0  . traMADol (ULTRAM) 50 MG tablet Take 1 tablet (50 mg total) by mouth every 6 (six) hours as needed for moderate pain. 20 tablet 0    Prior to Admission  medications   Medication Sig Start Date End Date Taking? Authorizing Provider  amLODipine (NORVASC) 10 MG tablet Take 1 tablet (10 mg total) by mouth daily. 05/09/17  Yes Eugenie Filler, MD  metoprolol tartrate (LOPRESSOR) 25 MG tablet Take 0.5 tablets (12.5 mg total) by mouth 2 (two) times daily. 05/08/17  Yes Eugenie Filler, MD  simethicone (MYLICON) 80 MG  chewable tablet Chew 2 tablets (160 mg total) by mouth 4 (four) times daily as needed for flatulence. 05/08/17  Yes Eugenie Filler, MD  traMADol (ULTRAM) 50 MG tablet Take 1 tablet (50 mg total) by mouth every 6 (six) hours as needed for moderate pain. 05/08/17  Yes Eugenie Filler, MD    Blood pressure 125/86, pulse 89, temperature 97.7 F (36.5 C), temperature source Oral, resp. rate 18, height 6' (1.829 m), weight 218 lb (98.9 kg), SpO2 93 %. Physical Exam: General: pleasant, WD/WN white male who is laying in bed in NAD HEENT: head is normocephalic, atraumatic.  Sclera are noninjected.  Pupils equal and round.  Ears and nose without any masses or lesions.  Mouth is pink and moist. Dentition fair Heart: regular, rate, and rhythm.  No obvious murmurs, gallops, or rubs noted.  Palpable pedal pulses bilaterally Lungs: CTAB, no wheezes, rhonchi, or rales noted.  Respiratory effort nonlabored Abd: well healed lap incisions, soft, distended, nontender, +BS, no masses, hernias, or organomegaly MS: all 4 extremities are symmetrical with no cyanosis, clubbing, or edema. Skin: warm and dry with no masses, lesions, or rashes Psych: A&Ox3 with an appropriate affect. Neuro: normal speech  Results for orders placed or performed during the hospital encounter of 05/21/17 (from the past 48 hour(s))  Lipase, blood     Status: Abnormal   Collection Time: 05/21/17  4:11 PM  Result Value Ref Range   Lipase 124 (H) 11 - 51 U/L    Comment: Performed at [facility], [place] [address], [place],  [address]  Comprehensive metabolic panel     Status: Abnormal   Collection Time: 05/21/17  4:11 PM  Result Value Ref Range   Sodium 136 135 - 145 mmol/L   Potassium 4.4 3.5 - 5.1 mmol/L   Chloride 100 (L) 101 - 111 mmol/L   CO2 24 22 - 32 mmol/L   Glucose, Bld 145 (H) 65 - 99 mg/dL   BUN 18 6 - 20 mg/dL   Creatinine, Ser 1.20 0.61 - 1.24 mg/dL   Calcium 9.0 8.9 - 10.3 mg/dL   Total Protein 7.2 6.5 -  8.1 g/dL   Albumin 2.8 (L) 3.5 - 5.0 g/dL   AST 59 (H) 15 - 41 U/L   ALT 62 17 - 63 U/L   Alkaline Phosphatase 308 (H) 38 - 126 U/L   Total Bilirubin 2.3 (H) 0.3 - 1.2 mg/dL   GFR calc non Af Amer 59 (L) >60 mL/min   GFR calc Af Amer >60 >60 mL/min    Comment: (NOTE) The eGFR has been calculated using the CKD EPI equation. This calculation has not been validated in all clinical situations. eGFR's persistently <60 mL/min signify possible Chronic Kidney Disease.    Anion gap 12 5 - 15    Comment: Performed at [facility] [address], [place] [address]  CBC     Status: Abnormal   Collection Time: 05/21/17  4:11 PM  Result Value Ref Range   WBC 12.2 (H) 4.0 - 10.5 K/uL   RBC 5.09 4.22 -  5.81 MIL/uL   Hemoglobin 14.8 13.0 - 17.0 g/dL   HCT 43.5 39.0 - 52.0 %   MCV 85.5 78.0 - 100.0 fL   MCH 29.1 26.0 - 34.0 pg   MCHC 34.0 30.0 - 36.0 g/dL   RDW 13.0 11.5 - 15.5 %   Platelets 648 (H) 150 - 400 K/uL    Comment: Performed at [facility] [address], [place], [place] [address]  Urinalysis, Routine w reflex microscopic     Status: Abnormal   Collection Time: 05/21/17  8:51 PM  Result Value Ref Range   Color, Urine AMBER (A) YELLOW    Comment: BIOCHEMICALS MAY BE AFFECTED BY COLOR   APPearance CLOUDY (A) CLEAR   Specific Gravity, Urine 1.028 1.005 - 1.030   pH 5.0 5.0 - 8.0   Glucose, UA 50 (A) NEGATIVE mg/dL   Hgb urine dipstick NEGATIVE NEGATIVE   Bilirubin Urine MODERATE (A) NEGATIVE   Ketones, ur 20 (A) NEGATIVE mg/dL   Protein, ur 100 (A) NEGATIVE mg/dL   Nitrite NEGATIVE NEGATIVE   Leukocytes, UA NEGATIVE NEGATIVE   RBC / HPF 6-30 0 - 5 RBC/hpf   WBC, UA 6-30 0 - 5 WBC/hpf   Bacteria, UA NONE SEEN NONE SEEN   Squamous Epithelial / LPF 0-5 (A) NONE SEEN   Mucus PRESENT    Hyaline Casts, UA PRESENT     Comment: Performed at [facility], [place] [address], [place], [place] [address]  CBC with Differential/Platelet     Status: Abnormal    Collection Time: 05/22/17  7:16 AM  Result Value Ref Range   WBC 12.9 (H) 4.0 - 10.5 K/uL   RBC 4.68 4.22 - 5.81 MIL/uL   Hemoglobin 13.7 13.0 - 17.0 g/dL   HCT 40.6 39.0 - 52.0 %   MCV 86.8 78.0 - 100.0 fL   MCH 29.3 26.0 - 34.0 pg   MCHC 33.7 30.0 - 36.0 g/dL   RDW 13.5 11.5 - 15.5 %   Platelets 441 (H) 150 - 400 K/uL   Neutrophils Relative % 82 %   Neutro Abs 10.6 (H) 1.7 - 7.7 K/uL   Lymphocytes Relative 10 %   Lymphs Abs 1.3 0.7 - 4.0 K/uL   Monocytes Relative 3 %   Monocytes Absolute 0.4 0.1 - 1.0 K/uL   Eosinophils Relative 5 %   Eosinophils Absolute 0.6 0.0 - 0.7 K/uL   Basophils Relative 0 %   Basophils Absolute 0.0 0.0 - 0.1 K/uL    Comment: Performed at [facility] [address], [place], [place] [address]  Comprehensive metabolic panel     Status: Abnormal   Collection Time: 05/22/17  7:16 AM  Result Value Ref Range   Sodium 135 135 - 145 mmol/L   Potassium 4.4 3.5 - 5.1 mmol/L   Chloride 104 101 - 111 mmol/L   CO2 22 22 - 32 mmol/L   Glucose, Bld 142 (H) 65 - 99 mg/dL   BUN 18 6 - 20 mg/dL   Creatinine, Ser 0.94 0.61 - 1.24 mg/dL   Calcium 8.1 (L) 8.9 - 10.3 mg/dL   Total Protein 5.8 (L) 6.5 - 8.1 g/dL   Albumin 2.2 (L) 3.5 - 5.0 g/dL   AST 49 (H) 15 - 41 U/L   ALT 46 17 - 63 U/L   Alkaline Phosphatase 256 (H) 38 - 126 U/L   Total Bilirubin 2.2 (H) 0.3 - 1.2 mg/dL   GFR calc non Af  Amer >60 >60 mL/min   GFR calc Af Amer >60 >60 mL/min    Comment: (NOTE) The eGFR has been calculated using the CKD EPI equation. This calculation has not been validated in all clinical situations. eGFR's persistently <60 mL/min signify possible Chronic Kidney Disease.    Anion gap 9 5 - 15    Comment: Performed at [facility] [address] [place], [place] [address]  Lipase, blood     Status: Abnormal   Collection Time: 05/22/17  7:16 AM  Result Value Ref Range   Lipase 113 (H) 11 - 51 U/L    Comment: Performed at [facility], [place] [address], [place], [place] [address]  Lactate dehydrogenase     Status: None   Collection Time: 05/22/17  7:16 AM  Result Value Ref Range   LDH 162 98 - 192 U/L    Comment: Performed at [facility], [place] [address], [place], [place] [address]  Magnesium     Status: None   Collection Time: 05/22/17  7:28 AM  Result Value Ref Range   Magnesium 1.9 1.7 - 2.4 mg/dL    Comment: Performed at [facility] [address], [place], [place] [address]  Phosphorus     Status: None   Collection Time: 05/22/17  7:28 AM  Result Value Ref Range   Phosphorus 4.0 2.5 - 4.6 mg/dL    Comment: Performed at [facility] [address], [place], [place] [address]   Ct Abdomen Pelvis W Contrast  Result Date: 05/22/2017 CLINICAL DATA:  Abdominal pain and distension. Diarrhea. Cholecystectomy 16 days prior. EXAM: CT ABDOMEN AND PELVIS WITH CONTRAST TECHNIQUE: Multidetector CT imaging of the abdomen and pelvis was performed using the standard protocol following bolus administration of intravenous contrast. CONTRAST:  167m ISOVUE-300 IOPAMIDOL (ISOVUE-300) INJECTION 61% COMPARISON:  CT 05/02/2017.  MRI 05/04/2017 FINDINGS: Lower chest: Linear atelectasis in both lower lobes. Small right pleural effusion. There are coronary artery calcifications. Hepatobiliary: Decreased hepatic density consistent with steatosis. No focal hepatic lesion. Interval cholecystectomy from prior imaging. Small amount of free fluid in the gallbladder fossa image 33 series 3. More focal rounded fluid proximal to cholecystectomy clips measuring 1.4 x 2.3 cm may be a cystic duct remnant or postoperative seroma. No internal air. No biliary dilatation. Pancreas: Peripancreatic fat stranding and free fluid about the head and body of the pancreas. No ductal dilatation. No organized peripancreatic collection. Spleen: Normal in size without focal abnormality. Adrenals/Urinary Tract: Normal adrenal glands. No hydronephrosis or perinephric edema.  Homogeneous renal enhancement with symmetric excretion on delayed phase imaging. Medial to the right kidney is a 2.9 x 2.8 cm heterogeneous low-density structure that is new from prior exam. This appears separate from the right kidney and is adjacent to the renal artery. No surrounding fat stranding. Urinary bladder is non distended without wall thickening. Stomach/Bowel: Stomach physiologically distended. No gastric wall thickening. Presence of intra-abdominal ascites partially limits bowel evaluation. Allowing for this, no evidence of bowel wall thickening, obstruction or inflammatory change. Administered enteric contrast reaches distal small bowel. Appendix is normal. Vascular/Lymphatic: 3.3 cm infrarenal abdominal aortic aneurysm, unchanged. Moderate aortic atherosclerosis. Limited assessment for adenopathy given presence of ascites. Reproductive: Prostate is unremarkable. Other: Moderate abdominopelvic ascites is new from prior exam. Fluid measures simple fluid density. No free air. No evidence of intra-abdominal abscess. Small fat containing umbilical hernia. Musculoskeletal: Stable degenerative change in the lumbar spine with endplate irregularity at L3-L4.  There are no acute or suspicious osseous abnormalities. IMPRESSION: 1. Moderate volume abdominopelvic ascites, new from prior exam. 2. Persistent peripancreatic fat stranding about the head and body, with small amount peripancreatic fluid, suspicious for pancreatitis. No focal fluid collection. 3. Small fluid density structure adjacent to surgical clips in the gallbladder fossa may be a cystic duct remnant or postoperative seroma. No surrounding inflammation or internal air to suggest abscess. 4. A 2.9 x 2.8 cm slightly heterogeneous structure medial to the right kidney is new from prior exam, suggesting hematoma. No active bleeding. 5. Chronic findings include hepatic steatosis and infrarenal abdominal aortic aneurysm with maximal dimension 3.3 cm.  Electronically Signed   By: Jeb Levering M.D.   On: 05/22/2017 04:05   Anti-infectives (From admission, onward)   Start     Dose/Rate Route Frequency Ordered Stop   05/22/17 1600  piperacillin-tazobactam (ZOSYN) IVPB 3.375 g     3.375 g 12.5 mL/hr over 240 Minutes Intravenous Every 8 hours 05/22/17 0816     05/22/17 0830  piperacillin-tazobactam (ZOSYN) IVPB 3.375 g     3.375 g 100 mL/hr over 30 Minutes Intravenous  Once 05/22/17 0816          Assessment/Plan HTN HLD Prediabetes  Acute pancreatitis s/p laparoscopic cholecystectomy 05/06/17 Dr. Barry Dienes - CT scan shows persistent peripancreatic fat stranding about the head and body with small amount peripancreatic fluid suspicious for pancreatitis - WBC 12.2, VSS - Lipase 124, AST 59, ALT 62, alk phos 308, total bilirubin 2.3 - MRCP pending  ID - zosyn 3/20>> VTE - SCDs FEN - IVF, NPO Foley - none  Plan - Patient with findings of acute pancreatitis on CT scan and mildly elevated lipase. MRCP pending, but due to elevated bilirubin and alk phos patient may have a retained stone causing his pancreatitis. Recommend GI consult and ERCP.  Wellington Hampshire, [facility] Surgery 05/22/2017, 9:41 AM Pager: [phone] Consults: [phone] Mon-Fri 7:00 am-4:30 pm Sat-Sun 7:00 am-11:30 am

## 2017-05-23 ENCOUNTER — Inpatient Hospital Stay (HOSPITAL_COMMUNITY): Payer: Medicare HMO | Admitting: Anesthesiology

## 2017-05-23 ENCOUNTER — Encounter (HOSPITAL_COMMUNITY): Admission: EM | Disposition: A | Discharge status: Home / Self Care | Payer: Self-pay | Source: Home / Self Care

## 2017-05-23 ENCOUNTER — Inpatient Hospital Stay (HOSPITAL_COMMUNITY): Payer: Medicare HMO

## 2017-05-23 ENCOUNTER — Encounter (HOSPITAL_COMMUNITY): Payer: Self-pay | Admitting: Anesthesiology

## 2017-05-23 DIAGNOSIS — R1011 Right upper quadrant pain: Secondary | ICD-10-CM

## 2017-05-23 DIAGNOSIS — K851 Biliary acute pancreatitis without necrosis or infection: Secondary | ICD-10-CM

## 2017-05-23 HISTORY — PX: ERCP: SHX5425

## 2017-05-23 LAB — COMPREHENSIVE METABOLIC PANEL
ALT: 36 U/L (ref 17–63)
AST: 36 U/L (ref 15–41)
Albumin: 2.1 g/dL — ABNORMAL LOW (ref 3.5–5.0)
Alkaline Phosphatase: 243 U/L — ABNORMAL HIGH (ref 38–126)
Anion gap: 11 (ref 5–15)
BILIRUBIN TOTAL: 2.4 mg/dL — AB (ref 0.3–1.2)
BUN: 18 mg/dL (ref 6–20)
CALCIUM: 8.2 mg/dL — AB (ref 8.9–10.3)
CHLORIDE: 100 mmol/L — AB (ref 101–111)
CO2: 21 mmol/L — ABNORMAL LOW (ref 22–32)
CREATININE: 1.06 mg/dL (ref 0.61–1.24)
GFR calc Af Amer: >60 mL/min (ref >60)
Glucose, Bld: 148 mg/dL — ABNORMAL HIGH (ref 65–99)
Potassium: 4.2 mmol/L (ref 3.5–5.1)
Sodium: 132 mmol/L — ABNORMAL LOW (ref 135–145)
TOTAL PROTEIN: 5.6 g/dL — AB (ref 6.5–8.1)

## 2017-05-23 LAB — CBC
HCT: 37.2 % — ABNORMAL LOW (ref 39.0–52.0)
Hemoglobin: 12.5 g/dL — ABNORMAL LOW (ref 13.0–17.0)
MCH: 28.9 pg (ref 26.0–34.0)
MCHC: 33.6 g/dL (ref 30.0–36.0)
MCV: 86.1 fL (ref 78.0–100.0)
PLATELETS: 470 10*3/uL — AB (ref 150–400)
RBC: 4.32 MIL/uL (ref 4.22–5.81)
RDW: 12.8 % (ref 11.5–15.5)
WBC: 10.1 10*3/uL (ref 4.0–10.5)

## 2017-05-23 SURGERY — ERCP, WITH INTERVENTION IF INDICATED
Anesthesia: General

## 2017-05-23 MED ORDER — INDOMETHACIN 50 MG RE SUPP
RECTAL | Status: AC
  Filled 2017-05-23: qty 2

## 2017-05-23 MED ORDER — PROPOFOL 10 MG/ML IV BOLUS
INTRAVENOUS | Status: DC | PRN
  Administered 2017-05-23: 130 mg via INTRAVENOUS

## 2017-05-23 MED ORDER — PHENYLEPHRINE HCL 10 MG/ML IJ SOLN
INTRAVENOUS | Status: DC | PRN
  Administered 2017-05-23: 50 ug/min via INTRAVENOUS

## 2017-05-23 MED ORDER — SUGAMMADEX SODIUM 200 MG/2ML IV SOLN
INTRAVENOUS | Status: DC | PRN
  Administered 2017-05-23: 400 mg via INTRAVENOUS

## 2017-05-23 MED ORDER — GLUCAGON HCL RDNA (DIAGNOSTIC) 1 MG IJ SOLR
INTRAMUSCULAR | Status: DC | PRN
  Administered 2017-05-23 (×2): .5 mg via INTRAVENOUS

## 2017-05-23 MED ORDER — GLUCAGON HCL RDNA (DIAGNOSTIC) 1 MG IJ SOLR
INTRAMUSCULAR | Status: AC
  Filled 2017-05-23: qty 1

## 2017-05-23 MED ORDER — SIMETHICONE 80 MG PO CHEW: 80.0000 mg | CHEWABLE_TABLET | Freq: Four times a day (QID) | ORAL | Status: DC | PRN

## 2017-05-23 MED ORDER — INDOMETHACIN 50 MG RE SUPP
RECTAL | Status: DC | PRN
  Administered 2017-05-23: 100 mg via RECTAL

## 2017-05-23 MED ORDER — FENTANYL CITRATE (PF) 100 MCG/2ML IJ SOLN
INTRAMUSCULAR | Status: DC | PRN
  Administered 2017-05-23: 100 ug via INTRAVENOUS

## 2017-05-23 MED ORDER — SODIUM CHLORIDE 0.9 % IV SOLN
INTRAVENOUS | Status: DC | PRN
  Administered 2017-05-23: 100 mL

## 2017-05-23 MED ORDER — INDOMETHACIN 50 MG RE SUPP: 100.0000 mg | Freq: Once | RECTAL | Status: DC

## 2017-05-23 MED ORDER — LACTATED RINGERS IV SOLN
INTRAVENOUS | Status: AC | PRN
  Administered 2017-05-23: 1000 mL via INTRAVENOUS

## 2017-05-23 MED ORDER — IOPAMIDOL (ISOVUE-300) INJECTION 61%
INTRAVENOUS | Status: AC
  Filled 2017-05-23: qty 50

## 2017-05-23 MED ORDER — ONDANSETRON HCL 4 MG/2ML IJ SOLN
INTRAMUSCULAR | Status: DC | PRN
  Administered 2017-05-23: 4 mg via INTRAVENOUS

## 2017-05-23 MED ORDER — LACTATED RINGERS IV SOLN
INTRAVENOUS | Status: DC | PRN
  Administered 2017-05-23: 11:00:00 via INTRAVENOUS

## 2017-05-23 MED ORDER — LIDOCAINE HCL (CARDIAC) 20 MG/ML IV SOLN
INTRAVENOUS | Status: DC | PRN
  Administered 2017-05-23: 40 mg via INTRAVENOUS

## 2017-05-23 MED ORDER — ROCURONIUM BROMIDE 100 MG/10ML IV SOLN
INTRAVENOUS | Status: DC | PRN
  Administered 2017-05-23: 40 mg via INTRAVENOUS
  Administered 2017-05-23 (×2): 10 mg via INTRAVENOUS

## 2017-05-23 MED ORDER — DEXAMETHASONE SODIUM PHOSPHATE 10 MG/ML IJ SOLN
INTRAMUSCULAR | Status: DC | PRN
  Administered 2017-05-23: 10 mg via INTRAVENOUS

## 2017-05-23 NOTE — Transfer of Care (Signed)
Immediate Anesthesia Transfer of Care Note  Patient: Nathan SciaraRobert Baxter  Procedure(s) Performed: ENDOSCOPIC RETROGRADE CHOLANGIOPANCREATOGRAPHY (ERCP) (N/A )  Patient Location: Endoscopy Unit  Anesthesia Type:General  Level of Consciousness: awake, alert  and oriented  Airway & Oxygen Therapy: Patient Spontanous Breathing and Patient connected to nasal cannula oxygen  Post-op Assessment: Report given to RN and Post -op Vital signs reviewed and stable  Post vital signs: Reviewed and stable  Last Vitals:  Vitals Value Taken Time  BP    Temp    Pulse    Resp    SpO2      Last Pain:  Vitals:   05/23/17 1051  TempSrc: Oral  PainSc: 0-No pain         Complications: No apparent anesthesia complications

## 2017-05-23 NOTE — Progress Notes (Signed)
IR consulted for possible aspiration and drainage of intra-abdominal fluid collection.  HIDA scan reviewed; patient with apparent bile leak.  He is currently in endoscopy for stent placement.   Reviewed case with Dr. Deanne CofferHassell.  No current indication for drain placement.   Notified ordering service.   Loyce DysKacie Patrik Turnbaugh, MS RD PA-C 11:35 AM

## 2017-05-23 NOTE — Progress Notes (Signed)
[facility] Progress Note  Day of Surgery  Subjective: CC: abdominal distention Patient reports abdomen feels distended and he is having a lot of gas pain. Passing flatus. Denies n/v.  UOP good. VSS.   Objective: Vital signs in last 24 hours: Temp:  [98.7 F (37.1 C)] 98.7 F (37.1 C) (03/21 0610) Pulse Rate:  [88-102] 90 (03/21 0610) Resp:  [16-18] 16 (03/21 0610) BP: (124-141)/(80-86) 124/80 (03/21 0610) SpO2:  [94 %-97 %] 97 % (03/21 0610) Weight:  [102.5 kg (225 lb 15.5 oz)] 102.5 kg (225 lb 15.5 oz) (03/21 0610) Last BM Date: 05/21/17  Intake/Output from previous day: 03/20 0701 - 03/21 0700 In: 240 [P.O.:240] Out: -  Intake/Output this shift: No intake/output data recorded.  PE: Gen:  Alert, NAD, pleasant Card:  Regular rate and rhythm, pedal pulses 2+ BL Pulm:  Normal effort, clear to auscultation bilaterally Abd: Soft, mildly tender, moderately distended, bowel sounds hypoactive, no HSM, incisions C/D/I Skin: warm and dry, no rashes  Psych: A&Ox3   Lab Results:  Recent Labs    05/22/17 0716 05/23/17 0616  WBC 12.9* 10.1  HGB 13.7 12.5*  HCT 40.6 37.2*  PLT 441* 470*   BMET Recent Labs    05/22/17 0716 05/23/17 0616  NA 135 132*  K 4.4 4.2  CL 104 100*  CO2 22 21*  GLUCOSE 142* 148*  BUN 18 18  CREATININE 0.94 1.06  CALCIUM 8.1* 8.2*   PT/INR Recent Labs    05/22/17 1531  LABPROT 14.5  INR 1.14   CMP     Component Value Date/Time   NA 132 (L) 05/23/2017 0616   K 4.2 05/23/2017 0616   CL 100 (L) 05/23/2017 0616   CO2 21 (L) 05/23/2017 0616   GLUCOSE 148 (H) 05/23/2017 0616   BUN 18 05/23/2017 0616   CREATININE 1.06 05/23/2017 0616   CALCIUM 8.2 (L) 05/23/2017 0616   PROT 5.6 (L) 05/23/2017 0616   ALBUMIN 2.1 (L) 05/23/2017 0616   AST 36 05/23/2017 0616   ALT 36 05/23/2017 0616   ALKPHOS 243 (H) 05/23/2017 0616   BILITOT 2.4 (H) 05/23/2017 0616   GFRNONAA >60 05/23/2017 0616   GFRAA >60 05/23/2017 0616   Lipase      Component Value Date/Time   LIPASE 113 (H) 05/22/2017 0716       Studies/Results: Nm Hepatobiliary Liver Func  Result Date: 05/22/2017 CLINICAL DATA:  History of cholecystectomy 16 days ago, now with intra-abdominal fluid worrisome for a bile leak. EXAM: NUCLEAR MEDICINE HEPATOBILIARY IMAGING TECHNIQUE: Sequential images of the abdomen were obtained out to 60 minutes following intravenous administration of radiopharmaceutical. RADIOPHARMACEUTICALS:  5.1 mCi Tc-45m Choletec IV COMPARISON:  CT abdomen pelvis-05/22/2017 FINDINGS: There is homogeneous distribution of injected radiotracer throughout the hepatic parenchyma. There is early excretion of radiotracer with opacification of the central aspect of the biliary tree. There is pooling of radiotracer along the gallbladder fossa with eventual spillage into the right hemiabdomen extending to the level right hemiabdomen to the pelvis bilaterally. There is no definitive passage of radiotracer activity into the duodenum. IMPRESSION: Examination is positive for biliary leak as detailed above. Further evaluation with ERCP could be performed as indicated. These results will be called to the ordering clinician or representative by the Radiologist Assistant, and communication documented in the PACS or zVision Dashboard. Electronically Signed   By: JSandi MariscalM.D.   On: 05/22/2017 18:17   Ct Abdomen Pelvis W Contrast  Result Date: 05/22/2017 CLINICAL DATA:  Abdominal pain and distension. Diarrhea. Cholecystectomy 16 days prior. EXAM: CT ABDOMEN AND PELVIS WITH CONTRAST TECHNIQUE: Multidetector CT imaging of the abdomen and pelvis was performed using the standard protocol following bolus administration of intravenous contrast. CONTRAST:  158m ISOVUE-300 IOPAMIDOL (ISOVUE-300) INJECTION 61% COMPARISON:  CT 05/02/2017.  MRI 05/04/2017 FINDINGS: Lower chest: Linear atelectasis in both lower lobes. Small right pleural effusion. There are coronary artery  calcifications. Hepatobiliary: Decreased hepatic density consistent with steatosis. No focal hepatic lesion. Interval cholecystectomy from prior imaging. Small amount of free fluid in the gallbladder fossa image 33 series 3. More focal rounded fluid proximal to cholecystectomy clips measuring 1.4 x 2.3 cm may be a cystic duct remnant or postoperative seroma. No internal air. No biliary dilatation. Pancreas: Peripancreatic fat stranding and free fluid about the head and body of the pancreas. No ductal dilatation. No organized peripancreatic collection. Spleen: Normal in size without focal abnormality. Adrenals/Urinary Tract: Normal adrenal glands. No hydronephrosis or perinephric edema. Homogeneous renal enhancement with symmetric excretion on delayed phase imaging. Medial to the right kidney is a 2.9 x 2.8 cm heterogeneous low-density structure that is new from prior exam. This appears separate from the right kidney and is adjacent to the renal artery. No surrounding fat stranding. Urinary bladder is non distended without wall thickening. Stomach/Bowel: Stomach physiologically distended. No gastric wall thickening. Presence of intra-abdominal ascites partially limits bowel evaluation. Allowing for this, no evidence of bowel wall thickening, obstruction or inflammatory change. Administered enteric contrast reaches distal small bowel. Appendix is normal. Vascular/Lymphatic: 3.3 cm infrarenal abdominal aortic aneurysm, unchanged. Moderate aortic atherosclerosis. Limited assessment for adenopathy given presence of ascites. Reproductive: Prostate is unremarkable. Other: Moderate abdominopelvic ascites is new from prior exam. Fluid measures simple fluid density. No free air. No evidence of intra-abdominal abscess. Small fat containing umbilical hernia. Musculoskeletal: Stable degenerative change in the lumbar spine with endplate irregularity at L3-L4. There are no acute or suspicious osseous abnormalities. IMPRESSION: 1.  Moderate volume abdominopelvic ascites, new from prior exam. 2. Persistent peripancreatic fat stranding about the head and body, with small amount peripancreatic fluid, suspicious for pancreatitis. No focal fluid collection. 3. Small fluid density structure adjacent to surgical clips in the gallbladder fossa may be a cystic duct remnant or postoperative seroma. No surrounding inflammation or internal air to suggest abscess. 4. A 2.9 x 2.8 cm slightly heterogeneous structure medial to the right kidney is new from prior exam, suggesting hematoma. No active bleeding. 5. Chronic findings include hepatic steatosis and infrarenal abdominal aortic aneurysm with maximal dimension 3.3 cm. Electronically Signed   By: MJeb LeveringM.D.   On: 05/22/2017 04:05    Anti-infectives: Anti-infectives (From admission, onward)   Start     Dose/Rate Route Frequency Ordered Stop   05/22/17 1600  piperacillin-tazobactam (ZOSYN) IVPB 3.375 g     3.375 g 12.5 mL/hr over 240 Minutes Intravenous Every 8 hours 05/22/17 0816     05/22/17 0830  piperacillin-tazobactam (ZOSYN) IVPB 3.375 g     3.375 g 100 mL/hr over 30 Minutes Intravenous  Once 05/22/17 0816 05/22/17 1138       Assessment/Plan HTN HLD Prediabetes  Acute pancreatitis s/p laparoscopic cholecystectomy 05/06/17 Dr. BBarry Dienes- CT scan shows persistent peripancreatic fat stranding about the head and body with small amount peripancreatic fluid suspicious for pancreatitis, focal rounded fluid proximal to cholecystectomy clips measuring 1.4 x 2.3 cm may be a cystic duct remnant or postoperative seroma, Small amount of free fluid in the gallbladder fossa  -  WBC 10.1, VSS - AST 36, ALT 36, alk phos 243, total bilirubin 2.4 - HIDA positive for bile leak - GI planning ERCP today - IR consulted for consideration of percutaneous drainage of fluid collection   ID - zosyn 3/20>> VTE - SCDs FEN - IVF, NPO Foley - none  Plan - ERCP today. IR consulted. Continue  current management.     LOS: 1 day    Brigid Re , [facility] Surgery 05/23/2017, 8:17 AM Pager: [phone] Consults: [phone] Mon-Fri 7:00 am-4:30 pm Sat-Sun 7:00 am-11:30 am

## 2017-05-23 NOTE — Progress Notes (Addendum)
Daily Rounding Note  05/23/2017, 9:15 AM  LOS: 1 day   SUBJECTIVE:   Chief complaint: abdominal pain. on left mostly, persists but Dilaudid is helping.  Pain increases with deeper inspiration. No N/V.     OBJECTIVE:         Vital signs in last 24 hours:    Temp:  [98.7 F (37.1 C)] 98.7 F (37.1 C) (03/21 0610) Pulse Rate:  [88-102] 90 (03/21 0610) Resp:  [16-18] 16 (03/21 0610) BP: (124-141)/(80-86) 124/80 (03/21 0610) SpO2:  [94 %-97 %] 97 % (03/21 0610) Weight:  [225 lb 15.5 oz (102.5 kg)] 225 lb 15.5 oz (102.5 kg) (03/21 0610) Last BM Date: 05/21/17 Filed Weights   05/21/17 1601 05/23/17 0610  Weight: 218 lb (98.9 kg) 225 lb 15.5 oz (102.5 kg)   General: pleasant, alert   Heart: RRR Chest: clear bil.  No dyspnea or SOB Abdomen: soft, seems less protuberant.  BS hypoactive.  Tender in left mid/lower abdomen.    Extremities: no CCE Neuro/Psych:  Oriented x 3.  Moving all limbs without obvious deficit/weakness.   Intake/Output from previous day: 03/20 0701 - 03/21 0700 In: 240 [P.O.:240] Out: -   Intake/Output this shift: No intake/output data recorded.  Lab Results: Recent Labs    05/21/17 1611 05/22/17 0716 05/23/17 0616  WBC 12.2* 12.9* 10.1  HGB 14.8 13.7 12.5*  HCT 43.5 40.6 37.2*  PLT 648* 441* 470*   BMET Recent Labs    05/21/17 1611 05/22/17 0716 05/23/17 0616  NA 136 135 132*  K 4.4 4.4 4.2  CL 100* 104 100*  CO2 24 22 21*  GLUCOSE 145* 142* 148*  BUN 18 18 18   CREATININE 1.20 0.94 1.06  CALCIUM 9.0 8.1* 8.2*   LFT Recent Labs    05/21/17 1611 05/22/17 0716 05/23/17 0616  PROT 7.2 5.8* 5.6*  ALBUMIN 2.8* 2.2* 2.1*  AST 59* 49* 36  ALT 62 46 36  ALKPHOS 308* 256* 243*  BILITOT 2.3* 2.2* 2.4*   PT/INR Recent Labs    05/22/17 1531  LABPROT 14.5  INR 1.14   Hepatitis Panel No results for input(s): HEPBSAG, HCVAB, HEPAIGM, HEPBIGM in the last 72  hours.  Studies/Results: Nm Hepatobiliary Liver Func  Result Date: 05/22/2017 CLINICAL DATA:  History of cholecystectomy 16 days ago, now with intra-abdominal fluid worrisome for a bile leak. EXAM: NUCLEAR MEDICINE HEPATOBILIARY IMAGING TECHNIQUE: Sequential images of the abdomen were obtained out to 60 minutes following intravenous administration of radiopharmaceutical. RADIOPHARMACEUTICALS:  5.1 mCi Tc-75m  Choletec IV COMPARISON:  CT abdomen pelvis-05/22/2017 FINDINGS: There is homogeneous distribution of injected radiotracer throughout the hepatic parenchyma. There is early excretion of radiotracer with opacification of the central aspect of the biliary tree. There is pooling of radiotracer along the gallbladder fossa with eventual spillage into the right hemiabdomen extending to the level right hemiabdomen to the pelvis bilaterally. There is no definitive passage of radiotracer activity into the duodenum. IMPRESSION: Examination is positive for biliary leak as detailed above. Further evaluation with ERCP could be performed as indicated. These results will be called to the ordering clinician or representative by the Radiologist Assistant, and communication documented in the PACS or zVision Dashboard. Electronically Signed   By: Simonne Come M.D.   On: 05/22/2017 18:17   Ct Abdomen Pelvis W Contrast  Result Date: 05/22/2017 CLINICAL DATA:  Abdominal pain and distension. Diarrhea. Cholecystectomy 16 days prior. EXAM: CT ABDOMEN AND PELVIS WITH CONTRAST  TECHNIQUE: Multidetector CT imaging of the abdomen and pelvis was performed using the standard protocol following bolus administration of intravenous contrast. CONTRAST:  ISOVUE-300 IOPAMIDOL (ISOVUE-300) INJECTION 61% COMPARISON:  CT 05/02/2017.  MRI 05/04/2017 FINDINGS: Lower chest: Linear atelectasis in both lower lobes. Small right pleural effusion. There are coronary artery calcifications. Hepatobiliary: Decreased hepatic density consistent with  steatosis. No focal hepatic lesion. Interval cholecystectomy from prior imaging. Small amount of free fluid in the gallbladder fossa image 33 series 3. More focal rounded fluid proximal to cholecystectomy clips measuring 1.4 x 2.3 cm may be a cystic duct remnant or postoperative seroma. No internal air. No biliary dilatation. Pancreas: Peripancreatic fat stranding and free fluid about the head and body of the pancreas. No ductal dilatation. No organized peripancreatic collection. Spleen: Normal in size without focal abnormality. Adrenals/Urinary Tract: Normal adrenal glands. No hydronephrosis or perinephric edema. Homogeneous renal enhancement with symmetric excretion on delayed phase imaging. Medial to the right kidney is a 2.9 x 2.8 cm heterogeneous low-density structure that is new from prior exam. This appears separate from the right kidney and is adjacent to the renal artery. No surrounding fat stranding. Urinary bladder is non distended without wall thickening. Stomach/Bowel: Stomach physiologically distended. No gastric wall thickening. Presence of intra-abdominal ascites partially limits bowel evaluation. Allowing for this, no evidence of bowel wall thickening, obstruction or inflammatory change. Administered enteric contrast reaches distal small bowel. Appendix is normal. Vascular/Lymphatic: 3.3 cm infrarenal abdominal aortic aneurysm, unchanged. Moderate aortic atherosclerosis. Limited assessment for adenopathy given presence of ascites. Reproductive: Prostate is unremarkable. Other: Moderate abdominopelvic ascites is new from prior exam. Fluid measures simple fluid density. No free air. No evidence of intra-abdominal abscess. Small fat containing umbilical hernia. Musculoskeletal: Stable degenerative change in the lumbar spine with endplate irregularity at L3-L4. There are no acute or suspicious osseous abnormalities. IMPRESSION: 1. Moderate volume abdominopelvic ascites, new from prior exam. 2.  Persistent peripancreatic fat stranding about the head and body, with small amount peripancreatic fluid, suspicious for pancreatitis. No focal fluid collection. 3. Small fluid density structure adjacent to surgical clips in the gallbladder fossa may be a cystic duct remnant or postoperative seroma. No surrounding inflammation or internal air to suggest abscess. 4. A 2.9 x 2.8 cm slightly heterogeneous structure medial to the right kidney is new from prior exam, suggesting hematoma. No active bleeding. 5. Chronic findings include hepatic steatosis and infrarenal abdominal aortic aneurysm with maximal dimension 3.3 cm. Electronically Signed   By: Rubye Oaks M.D.   On: 05/22/2017 04:05   Scheduled Meds: . famotidine  20 mg Oral BID  . sodium chloride flush  3 mL Intravenous Q12H   Continuous Infusions: . sodium chloride 125 mL/hr at 05/22/17 2128  . piperacillin-tazobactam (ZOSYN)  IV 3.375 g (05/23/17 0844)   PRN Meds:.acetaminophen **OR** acetaminophen, HYDROmorphone (DILAUDID) injection, ondansetron (ZOFRAN) IV **OR** promethazine, simethicone   ASSESMENT:   *  Post op bile leak.  abd pain persists but better controlled with Dilaudid.  WBCs better on Zosyn.  Interesting that pt's pain is in Left abdomen, though bile tracking into right abdomen.     PLAN   *  ERCP/stent placement slated for 11:30 this AM.  Risks of sedation, pancreatitis, bleeding, bowel inury, inablitiy to complete study due to anatomic challenges d/w pt and he is agreeable to proceed.   Has not gotten thinners.  NPO.  On zosyn.  Post op indocin ordered  *  Stop telemetry.      Maralyn Sago  [place]  05/23/2017, 9:15 AM Pager: [phone]: [phone]  I have discussed the case with the PA, and that is the plan I formulated. I personally interviewed and examined the patient.  HIDA positive, still may be stones in bile duct. ERCP today.  Explained in detail to the patient along with diagram of anatomy and description of stone removal and  stent placement.  The benefits and risks of the planned procedure were described in detail with the patient or (when appropriate) their health care proxy.  Risks were outlined as including, but not limited to, bleeding, infection, perforation, adverse medication reaction leading to cardiac or pulmonary decompensation, or pancreatitis (if ERCP).  The limitation of incomplete mucosal visualization was also discussed.  No guarantees or warranties were given.  He is agreeable.    It still seems to me he needs an external drain for the bile leak.  I will communicate ERCP findings to surgical service.    Charlie PitterHenry L Danis III Pager [phone]  Mon-Fri 8a-5p [phone] after 5p, weekends, holidays

## 2017-05-23 NOTE — Anesthesia Procedure Notes (Signed)
Procedure Name: Intubation Date/Time: 05/23/2017 11:50 AM Performed by: Neldon Newport, CRNA Patient Re-evaluated:Patient Re-evaluated prior to induction Oxygen Delivery Method: Circle system utilized Preoxygenation: Pre-oxygenation with 100% oxygen Induction Type: IV induction Ventilation: Mask ventilation without difficulty and Oral airway inserted - appropriate to patient size Laryngoscope Size: Mac and 3 Grade View: Grade I Tube type: Oral Tube size: 7.5 mm Number of attempts: 1 Placement Confirmation: breath sounds checked- equal and bilateral,  positive ETCO2 and ETT inserted through vocal cords under direct vision Secured at: 21 cm Tube secured with: Tape Dental Injury: Teeth and Oropharynx as per pre-operative assessment

## 2017-05-23 NOTE — Progress Notes (Signed)
IR requested for image-guided percutaneous drainage for intra-abdominal fluid collection, approved by Dr. Deanne CofferHassell. Patient is unconsentable due to recent sedation from ERCP. Attempted to call patient's daughter but was not able to get a hold of her. Will reassess in the AM.  Baird LyonsAlexandra M Monicia Tse, MS PA-C 05/23/2017, 4:24 PM

## 2017-05-23 NOTE — Interval H&P Note (Signed)
History and Physical Interval Note:  05/23/2017 11:41 AM  Nathan Baxter  has presented today for surgery, with the diagnosis of ? bile leak, ? CBD stone  The various methods of treatment have been discussed with the patient and family. After consideration of risks, benefits and other options for treatment, the patient has consented to  Procedure(s): ENDOSCOPIC RETROGRADE CHOLANGIOPANCREATOGRAPHY (ERCP) (N/A) as a surgical intervention .  The patient's history has been reviewed, patient examined, no change in status, stable for surgery.  I have reviewed the patient's chart and labs.  Questions were answered to the patient's satisfaction.     Charlie PitterHenry L Danis III

## 2017-05-23 NOTE — Op Note (Signed)
[facility] Patient Name: Nathan Baxter Procedure Date : 05/23/2017 MRN: [id number] Attending MD: Starr Lake. Myrtie Neither , MD Date of Birth: 1945-12-29 CSN: [id number] Age: 72 Admit Type: Inpatient Procedure:                ERCP Indications:              Treatment of bile leak, Elevated liver enzymes,                            Recent cholecystectomy (difficult removal due to                            cholecystitis) Providers:                Starr Lake. Myrtie Neither, MD, Dwain Sarna, RN, Tomma Rakers, RN, Harrington Challenger, Technician Referring MD:             Violeta Gelinas, MD Medicines:                Indomethacin 100 mg PR, General Anesthesia Complications:            No immediate complications. Estimated Blood Loss:     Estimated blood loss: none. Procedure:                Pre-Anesthesia Assessment:                           - Prior to the procedure, a History and Physical                            was performed, and patient medications and                            allergies were reviewed. The patient's tolerance of                            previous anesthesia was also reviewed. The risks                            and benefits of the procedure and the sedation                            options and risks were discussed with the patient.                            All questions were answered, and informed consent                            was obtained. Prior Anticoagulants: The patient has                            taken no previous anticoagulant or antiplatelet  agents. ASA Grade Assessment: III - A patient with                            severe systemic disease. After reviewing the risks                            and benefits, the patient was deemed in                            satisfactory condition to undergo the procedure.                           After obtaining informed consent, the scope was      passed under direct vision. Throughout the                            procedure, the patient's blood pressure, pulse, and                            oxygen saturations were monitored continuously. The                            [id number]) scope was introduced through                            the mouth, and used to inject contrast into and                            used to inject contrast into the bile duct and                            ventral pancreatic duct. The ERCP was accomplished                            without difficulty. The patient tolerated the                            procedure well. Scope In: Scope Out: Findings:      A scout film of the abdomen was obtained. Surgical clips, consistent       with a previous cholecystectomy, were seen in the area of the right       upper quadrant of the abdomen. The esophagus was successfully intubated       under direct vision. The scope was advanced to a normal major papilla in       the descending duodenum without detailed examination of the pharynx,       larynx and associated structures, and upper GI tract. The upper GI tract       was grossly normal. Wire placement into and superficial cannulation of       and contrast injection into the ventral pancreatic duct was accomplished       with the traction (standard) sphincterotome. I personally interpreted       the pancreatic duct images. Ductal flow of contrast was adequate. Image       quality was  excellent. Contrast extended to the pancreatic duct. The in       the pancreas was normal. Once it was clear that the was not the desired       wire and cannula placement, the wire and sphincterotome were removed and       re-positioned. An 0.035 inch x 260 cm straight Hydra Jagwire was then       successfully passed into the biliary tree. The traction (standard)       sphincterotome was passed over the guidewire and the bile duct was then       deeply cannulated. Contrast  was injected, and brisk flow and excellent       images were obtained of the hepatic ducts and the entire extrahepatic       biliary tree. A large amount of extravasation of contrast originating       from the gallbladder remnant was observed, creating a large contrast       pool tracking into the right hemi-abdomen. The main bile duct contained       no stones. The CBD was 5mm in greatest dimension.      An attempt was made to pass a balloon catheter into the bile duct for       greater contrast opacification, but the catheter would not pass through       the biliary sphincter. The papilla was small. That device was removed.      Given that finding. a 3-5 mm biliary sphincterotomy was made with a       traction (standard) sphincterotome using blended current. There was no       post-sphincterotomy bleeding. One 7 Fr by 7 cm plastic stent with a       single external flap and a single internal flap was placed 6 cm into the       common bile duct. Bile flowed through the stent. The stent was in good       position, with the proximal end proximal to the cystic duct takeoff. The       total fluoroscopy exposure time was 4 minutes and 36 seconds. Impression:               - A bile leak was found.                           - A biliary sphincterotomy was performed.                           - One plastic stent was placed into the common bile                            duct. Moderate Sedation:      GETA Recommendation:           - Clear liquid diet.                           - I discussed with Dr. Janee Morn of the surgical                            service having an external drain placed so we can  monitor the bile leak.                           - Return patient to hospital ward for ongoing care. Procedure Code(s):        --- Professional ---                           [phone], Endoscopic retrograde                            cholangiopancreatography (ERCP); with  placement of                            endoscopic stent into biliary or pancreatic duct,                            including pre- and post-dilation and guide wire                            passage, when performed, including sphincterotomy,                            when performed, each stent Diagnosis Code(s):        --- Professional ---                           K83.9, Disease of biliary tract, unspecified                           R74.8, Abnormal levels of other serum enzymes                           K83.8, Other specified diseases of biliary tract CPT copyright 2016 American Medical Association. All rights reserved. The codes documented in this report are preliminary and upon coder review may  be revised to meet current compliance requirements. Henry L. Myrtie Neitheranis, MD 05/23/2017 1:20:11 PM This report has been signed electronically. Number of Addenda: 0

## 2017-05-23 NOTE — Anesthesia Preprocedure Evaluation (Addendum)
Anesthesia Evaluation  Patient identified by MRN, date of birth, ID band Patient awake    Reviewed: Allergy & Precautions, H&P , NPO status , Patient's Chart, lab work & pertinent test results  Airway Mallampati: II  TM Distance: >3 FB Neck ROM: Full    Dental no notable dental hx. (+) Upper Dentures, Dental Advisory Given   Pulmonary neg pulmonary ROS, former smoker,    Pulmonary exam normal breath sounds clear to auscultation       Cardiovascular hypertension, Pt. on medications + Peripheral Vascular Disease   Rhythm:Regular Rate:Normal     Neuro/Psych negative neurological ROS  negative psych ROS   GI/Hepatic negative GI ROS, Neg liver ROS,   Endo/Other  negative endocrine ROS  Renal/GU negative Renal ROS  negative genitourinary   Musculoskeletal   Abdominal   Peds  Hematology negative hematology ROS (+)   Anesthesia Other Findings   Reproductive/Obstetrics negative OB ROS                            Anesthesia Physical Anesthesia Plan  ASA: II  Anesthesia Plan: General   Post-op Pain Management:    Induction: Intravenous  PONV Risk Score and Plan: 3 and Ondansetron, Dexamethasone and Midazolam  Airway Management Planned: Oral ETT  Additional Equipment:   Intra-op Plan:   Post-operative Plan: Extubation in OR  Informed Consent: I have reviewed the patients History and Physical, chart, labs and discussed the procedure including the risks, benefits and alternatives for the proposed anesthesia with the patient or authorized representative who has indicated his/her understanding and acceptance.   Dental advisory given  Plan Discussed with: CRNA  Anesthesia Plan Comments:        Anesthesia Quick Evaluation

## 2017-05-23 NOTE — Anesthesia Postprocedure Evaluation (Signed)
Anesthesia Post Note  Patient: Nathan SciaraRobert Baxter  Procedure(s) Performed: ENDOSCOPIC RETROGRADE CHOLANGIOPANCREATOGRAPHY (ERCP) (N/A )     Patient location during evaluation: PACU Anesthesia Type: General Level of consciousness: awake and alert Pain management: pain level controlled Vital Signs Assessment: post-procedure vital signs reviewed and stable Respiratory status: spontaneous breathing, nonlabored ventilation, respiratory function stable and patient connected to nasal cannula oxygen Cardiovascular status: blood pressure returned to baseline and stable Postop Assessment: no apparent nausea or vomiting Anesthetic complications: no    Last Vitals:  Vitals Value Taken Time  BP 148/88 05/23/2017  2:15 PM  Temp    Pulse 79 05/23/2017  2:16 PM  Resp 19 05/23/2017  2:15 PM  SpO2 99 % 05/23/2017  2:16 PM  Vitals shown include unvalidated device data.  Last Pain:  Vitals:   05/23/17 1330  TempSrc:   PainSc: 0-No pain                 Buford Bremer,W. EDMOND

## 2017-05-23 NOTE — Progress Notes (Signed)
[facility]  I have spoken with Dr Violeta GelinasBurke Thompson ([facility]) who will take over from [facility] as attending. We can be consulted if needed in the future. Thank you.  Calvert CantorSaima Treazure Nery, MD Pager on [email or web address]

## 2017-05-24 ENCOUNTER — Inpatient Hospital Stay (HOSPITAL_COMMUNITY): Payer: Medicare HMO

## 2017-05-24 DIAGNOSIS — K838 Other specified diseases of biliary tract: Secondary | ICD-10-CM

## 2017-05-24 DIAGNOSIS — R748 Abnormal levels of other serum enzymes: Secondary | ICD-10-CM

## 2017-05-24 DIAGNOSIS — K839 Disease of biliary tract, unspecified: Secondary | ICD-10-CM

## 2017-05-24 LAB — COMPREHENSIVE METABOLIC PANEL
ALK PHOS: 220 U/L — AB (ref 38–126)
ALT: 28 U/L (ref 17–63)
AST: 28 U/L (ref 15–41)
Albumin: 1.9 g/dL — ABNORMAL LOW (ref 3.5–5.0)
Anion gap: 11 (ref 5–15)
BUN: 16 mg/dL (ref 6–20)
CO2: 21 mmol/L — AB (ref 22–32)
CREATININE: 0.89 mg/dL (ref 0.61–1.24)
Calcium: 8.6 mg/dL — ABNORMAL LOW (ref 8.9–10.3)
Chloride: 102 mmol/L (ref 101–111)
GFR calc non Af Amer: >60 mL/min (ref >60)
GLUCOSE: 145 mg/dL — AB (ref 65–99)
Potassium: 4.2 mmol/L (ref 3.5–5.1)
SODIUM: 134 mmol/L — AB (ref 135–145)
Total Bilirubin: 2 mg/dL — ABNORMAL HIGH (ref 0.3–1.2)
Total Protein: 5.6 g/dL — ABNORMAL LOW (ref 6.5–8.1)

## 2017-05-24 LAB — CBC
HEMATOCRIT: 33.9 % — AB (ref 39.0–52.0)
HEMOGLOBIN: 11.3 g/dL — AB (ref 13.0–17.0)
MCH: 28.4 pg (ref 26.0–34.0)
MCHC: 33.3 g/dL (ref 30.0–36.0)
MCV: 85.2 fL (ref 78.0–100.0)
Platelets: 432 10*3/uL — ABNORMAL HIGH (ref 150–400)
RBC: 3.98 MIL/uL — ABNORMAL LOW (ref 4.22–5.81)
RDW: 12.9 % (ref 11.5–15.5)
WBC: 11.3 10*3/uL — AB (ref 4.0–10.5)

## 2017-05-24 LAB — LIPASE, BLOOD: Lipase: 40 U/L (ref 11–51)

## 2017-05-24 MED ORDER — LIDOCAINE HCL 1 % IJ SOLN
INTRAMUSCULAR | Status: AC
  Filled 2017-05-24: qty 20

## 2017-05-24 MED ORDER — MIDAZOLAM HCL 2 MG/2ML IJ SOLN
INTRAMUSCULAR | Status: AC
  Filled 2017-05-24: qty 2

## 2017-05-24 MED ORDER — MIDAZOLAM HCL 2 MG/2ML IJ SOLN
INTRAMUSCULAR | Status: AC | PRN
  Administered 2017-05-24: 1 mg via INTRAVENOUS
  Administered 2017-05-24: 0.5 mg via INTRAVENOUS
  Administered 2017-05-24: 1 mg via INTRAVENOUS

## 2017-05-24 MED ORDER — FENTANYL CITRATE (PF) 100 MCG/2ML IJ SOLN
INTRAMUSCULAR | Status: AC | PRN
  Administered 2017-05-24: 50 ug via INTRAVENOUS
  Administered 2017-05-24: 25 ug via INTRAVENOUS

## 2017-05-24 MED ORDER — FENTANYL CITRATE (PF) 100 MCG/2ML IJ SOLN
INTRAMUSCULAR | Status: AC
  Filled 2017-05-24: qty 2

## 2017-05-24 NOTE — Progress Notes (Addendum)
Patient ID: Nathan Baxter, male   DOB: 02-11-1946, 72 y.o.   MRN: [id number]    1 Day Post-Op  Subjective: Pt c/o bloating, but moving his bowels, mostly liquid.  No nausea.  Had stent placed by GI.  Objective: Vital signs in last 24 hours: Temp:  [97.5 F (36.4 C)-99.4 F (37.4 C)] 97.5 F (36.4 C) (03/22 0512) Pulse Rate:  [66-85] 66 (03/22 0512) Resp:  [16-24] 16 (03/22 0512) BP: (121-156)/(64-92) 127/64 (03/22 0512) SpO2:  [95 %-99 %] 98 % (03/22 0512) Weight:  [225 lb (102.1 kg)-226 lb 13.7 oz (102.9 kg)] 226 lb 13.7 oz (102.9 kg) (03/22 0512) Last BM Date: 05/21/17  Intake/Output from previous day: 03/21 0701 - 03/22 0700 In: 1186.8 [P.O.:240; I.V.:946.8] Out: 2 [Stool:1; Blood:1] Intake/Output this shift: No intake/output data recorded.  PE: Gen: NAD Heart: regular Lungs: CTAB Abd: soft, minimally bloated, +BS, NT, incisions are healing well.  Lab Results:  Recent Labs    05/23/17 0616 05/24/17 0606  WBC 10.1 11.3*  HGB 12.5* 11.3*  HCT 37.2* 33.9*  PLT 470* 432*   BMET Recent Labs    05/23/17 0616 05/24/17 0606  NA 132* 134*  K 4.2 4.2  CL 100* 102  CO2 21* 21*  GLUCOSE 148* 145*  BUN 18 16  CREATININE 1.06 0.89  CALCIUM 8.2* 8.6*   PT/INR Recent Labs    05/22/17 1531  LABPROT 14.5  INR 1.14   CMP     Component Value Date/Time   NA 134 (L) 05/24/2017 0606   K 4.2 05/24/2017 0606   CL 102 05/24/2017 0606   CO2 21 (L) 05/24/2017 0606   GLUCOSE 145 (H) 05/24/2017 0606   BUN 16 05/24/2017 0606   CREATININE 0.89 05/24/2017 0606   CALCIUM 8.6 (L) 05/24/2017 0606   PROT 5.6 (L) 05/24/2017 0606   ALBUMIN 1.9 (L) 05/24/2017 0606   AST 28 05/24/2017 0606   ALT 28 05/24/2017 0606   ALKPHOS 220 (H) 05/24/2017 0606   BILITOT 2.0 (H) 05/24/2017 0606   GFRNONAA >60 05/24/2017 0606   GFRAA >60 05/24/2017 0606   Lipase     Component Value Date/Time   LIPASE 40 05/24/2017 0606       Studies/Results: Nm Hepatobiliary Liver Func  Result  Date: 05/22/2017 CLINICAL DATA:  History of cholecystectomy 16 days ago, now with intra-abdominal fluid worrisome for a bile leak. EXAM: NUCLEAR MEDICINE HEPATOBILIARY IMAGING TECHNIQUE: Sequential images of the abdomen were obtained out to 60 minutes following intravenous administration of radiopharmaceutical. RADIOPHARMACEUTICALS:  5.1 mCi Tc-37m  Choletec IV COMPARISON:  CT abdomen pelvis-05/22/2017 FINDINGS: There is homogeneous distribution of injected radiotracer throughout the hepatic parenchyma. There is early excretion of radiotracer with opacification of the central aspect of the biliary tree. There is pooling of radiotracer along the gallbladder fossa with eventual spillage into the right hemiabdomen extending to the level right hemiabdomen to the pelvis bilaterally. There is no definitive passage of radiotracer activity into the duodenum. IMPRESSION: Examination is positive for biliary leak as detailed above. Further evaluation with ERCP could be performed as indicated. These results will be called to the ordering clinician or representative by the Radiologist Assistant, and communication documented in the PACS or zVision Dashboard. Electronically Signed   By: Simonne Come M.D.   On: 05/22/2017 18:17   Dg Ercp Biliary & Pancreatic Ducts  Result Date: 05/23/2017 CLINICAL DATA:  72 year old male with bile leak EXAM: ERCP TECHNIQUE: Multiple spot images obtained with the fluoroscopic device and submitted  for interpretation post-procedure. FLUOROSCOPY TIME:  Fluoroscopy Time:  4 minutes 37 seconds COMPARISON:  CT of the abdomen and pelvis 05/22/2017; nuclear medicine HIDA scan 05/22/2017 FINDINGS: A total of 12 intraoperative spot images are submitted for review. The images demonstrate a flexible endoscope in the descending duodenum with deep wire cannulation of the hepatic ducts. Subsequent cholangiogram demonstrates opacification of the biliary tree. Contrast extravasation is noted in the region of the  cystic duct. On the final images, a plastic biliary stent has been placed. IMPRESSION: 1. Bile leak from the region of the cystic duct remnant. 2. Placement of a plastic biliary stent. These images were submitted for radiologic interpretation only. Please see the procedural report for the amount of contrast and the fluoroscopy time utilized. Electronically Signed   By: Malachy MoanHeath  McCullough M.D.   On: 05/23/2017 16:00    Anti-infectives: Anti-infectives (From admission, onward)   Start     Dose/Rate Route Frequency Ordered Stop   05/22/17 1600  piperacillin-tazobactam (ZOSYN) IVPB 3.375 g     3.375 g 12.5 mL/hr over 240 Minutes Intravenous Every 8 hours 05/22/17 0816     05/22/17 0830  piperacillin-tazobactam (ZOSYN) IVPB 3.375 g     3.375 g 100 mL/hr over 30 Minutes Intravenous  Once 05/22/17 0816 05/22/17 1138       Assessment/Plan HTN HLD Prediabetes  Acute pancreatitis, bile leak, s/p laparoscopic cholecystectomy 05/06/17 Dr. Donell BeersByerly -CT scan shows persistent peripancreatic fat stranding about the head and body with small amount peripancreatic fluid suspicious for pancreatitis, focal rounded fluid proximal to cholecystectomy clips measuring 1.4 x 2.3 cm may be a cystic duct remnant or postoperative seroma, Small amount of free fluid in the gallbladder fossa  - WBC 10.1, VSS - LFTs down slightly, TB 2.0 - HIDA positive for bile leak - ERCP yesterday by GI - IR consulted for consideration of percutaneous drainage of fluid collection, likely today  ID -zosyn 3/20>> VTE -SCDs FEN -IVF, NPO Foley -none  Plan -ERCP yesterday with stent placement.  Plan for IR drain placement today.  Will continue to follow.       LOS: 2 days    Letha CapeKelly E Georgeanne Frankland , [facility] 05/24/2017, 8:30 AM Pager: [phone] Consults: [phone] Mon-Fri 7:00 am-4:30 pm Sat-Sun 7:00 am-11:30 am

## 2017-05-24 NOTE — Plan of Care (Signed)

## 2017-05-24 NOTE — Procedures (Signed)
CT guided placement of 12 french drain in right abdominal fluid collection.  Brown/orange fluid was rapidly draining into gravity bag.  Approximately 500 ml drained after a few minutes.  Sample sent for culture. No immediate complication.  Minimal blood loss.

## 2017-05-24 NOTE — Progress Notes (Addendum)
Daily Rounding Note  05/24/2017, 8:20 AM  LOS: 2 days   SUBJECTIVE:   Chief complaint: bile leak Abdominal pain improved.   No pain meds since Fentanyl given during ERCP.  Stools are loose, he percieves he is seeing the bile, now draining into intestine, in his stool.        OBJECTIVE:         Vital signs in last 24 hours:    Temp:  [97.5 F (36.4 C)-99.4 F (37.4 C)] 97.5 F (36.4 C) (03/22 0512) Pulse Rate:  [66-85] 66 (03/22 0512) Resp:  [16-24] 16 (03/22 0512) BP: (121-156)/(64-92) 127/64 (03/22 0512) SpO2:  [95 %-99 %] 98 % (03/22 0512) Weight:  [225 lb (102.1 kg)-226 lb 13.7 oz (102.9 kg)] 226 lb 13.7 oz (102.9 kg) (03/22 0512) Last BM Date: 05/21/17 Filed Weights   05/23/17 0610 05/23/17 1051 05/24/17 0512  Weight: 225 lb 15.5 oz (102.5 kg) 225 lb (102.1 kg) 226 lb 13.7 oz (102.9 kg)   General: not ill or uncomfortable   Heart: RRR Chest: clear bil.   Abdomen: soft, obese, ND.  Hypoactive BS.  Minor left sided "pressure", no guard or rebound.    Extremities: no CCE Neuro/Psych:  Oriented x 3.  No confusion.    Intake/Output from previous day: 03/21 0701 - 03/22 0700 In: 1186.8 [P.O.:240; I.V.:946.8] Out: 2 [Stool:1; Blood:1]  Intake/Output this shift: No intake/output data recorded.  Lab Results: Recent Labs    05/22/17 0716 05/23/17 0616 05/24/17 0606  WBC 12.9* 10.1 11.3*  HGB 13.7 12.5* 11.3*  HCT 40.6 37.2* 33.9*  PLT 441* 470* 432*   BMET Recent Labs    05/22/17 0716 05/23/17 0616 05/24/17 0606  NA 135 132* 134*  K 4.4 4.2 4.2  CL 104 100* 102  CO2 22 21* 21*  GLUCOSE 142* 148* 145*  BUN _0 CREATININE 0.94 1.06 0.89  CALCIUM 8.1* 8.2* 8.6*   LFT Recent Labs    05/22/17 0716 05/23/17 0616 05/24/17 0606  PROT 5.8* 5.6* 5.6*  ALBUMIN 2.2* 2.1* 1.9*  AST 49* 36 28  ALT 46 36 28  ALKPHOS 256* 243* 220*  BILITOT 2.2* 2.4* 2.0*   PT/INR Recent Labs     05/22/17 1531  LABPROT 14.5  INR 1.14   Hepatitis Panel No results for input(s): HEPBSAG, HCVAB, HEPAIGM, HEPBIGM in the last 72 hours.  Studies/Results: Nm Hepatobiliary Liver Func  Result Date: 05/22/2017 CLINICAL DATA:  History of cholecystectomy 16 days ago, now with intra-abdominal fluid worrisome for a bile leak. EXAM: NUCLEAR MEDICINE HEPATOBILIARY IMAGING TECHNIQUE: Sequential images of the abdomen were obtained out to 60 minutes following intravenous administration of radiopharmaceutical. RADIOPHARMACEUTICALS:  5.1 mCi Tc-36m Choletec IV COMPARISON:  CT abdomen pelvis-05/22/2017 FINDINGS: There is homogeneous distribution of injected radiotracer throughout the hepatic parenchyma. There is early excretion of radiotracer with opacification of the central aspect of the biliary tree. There is pooling of radiotracer along the gallbladder fossa with eventual spillage into the right hemiabdomen extending to the level right hemiabdomen to the pelvis bilaterally. There is no definitive passage of radiotracer activity into the duodenum. IMPRESSION: Examination is positive for biliary leak as detailed above. Further evaluation with ERCP could be performed as indicated. These results will be called to the ordering clinician or representative by the Radiologist Assistant, and communication documented in the PACS or zVision Dashboard. Electronically Signed   By: JSandi MariscalM.D.   On:  05/22/2017 18:17   Dg Ercp Biliary & Pancreatic Ducts  Result Date: 05/23/2017 CLINICAL DATA:  72 year old male with bile leak EXAM: ERCP TECHNIQUE: Multiple spot images obtained with the fluoroscopic device and submitted for interpretation post-procedure. FLUOROSCOPY TIME:  Fluoroscopy Time:  4 minutes 37 seconds COMPARISON:  CT of the abdomen and pelvis 05/22/2017; nuclear medicine HIDA scan 05/22/2017 FINDINGS: A total of 12 intraoperative spot images are submitted for review. The images demonstrate a flexible endoscope  in the descending duodenum with deep wire cannulation of the hepatic ducts. Subsequent cholangiogram demonstrates opacification of the biliary tree. Contrast extravasation is noted in the region of the cystic duct. On the final images, a plastic biliary stent has been placed. IMPRESSION: 1. Bile leak from the region of the cystic duct remnant. 2. Placement of a plastic biliary stent. These images were submitted for radiologic interpretation only. Please see the procedural report for the amount of contrast and the fluoroscopy time utilized. Electronically Signed   By: Jacqulynn Cadet M.D.   On: 05/23/2017 16:00   Scheduled Meds: . famotidine  20 mg Oral BID  . indomethacin  100 mg Rectal Once  . sodium chloride flush  3 mL Intravenous Q12H   Continuous Infusions: . sodium chloride 125 mL/hr at 05/23/17 1814  . piperacillin-tazobactam (ZOSYN)  IV Stopped (05/23/17 2352)   PRN Meds:.acetaminophen **OR** acetaminophen, HYDROmorphone (DILAUDID) injection, ondansetron (ZOFRAN) IV **OR** promethazine, simethicone  ASSESMENT:   *  Post op bile leak.   3/21 ERCP confirmed leak, underwent sphincterotomy and stent placed to CBD IR planning to place perc drain (due to sedation, unable to obtain consent yesterday)  LFTs and Lipase improved.     PLAN   *   Once placed, monitor biliary drain ouput.  Advance diet to carb mod after drain plaement.    *  Dr Loletha Carrow will remove stent in ~ 6 weeks, outpt procedure.  Made outpt appt for him with PA Guenther 4/3 at 2:30 PM.  No need for PPI etc, stopping Pepcid      *  Continue Zosyn for ?? Days.  So far no growth on blood clx from 3 days ago.      Azucena Freed  05/24/2017, 8:20 AM Pager: [phone]  I have discussed the case with the PA, and that is the plan I formulated. I personally interviewed and examined the patient.  His abdominal pain has improved and he generally feels better after ERCP/stent placement yesterday. Alk phos and T bili improved as  well.  For IR-guided drain to relieve bile leak collection today.  I will follow over weekend.  Nothing else to add now.  I do not think he needs ABx, so I discontinued them.  Nelida Meuse III Pager [phone]  Mon-Fri 8a-5p [phone] after 5p, weekends, holidays

## 2017-05-24 NOTE — Progress Notes (Signed)
Patient being transferred downstairs to IR for drain placement.

## 2017-05-24 NOTE — Consult Note (Signed)
Chief Complaint: Patient was seen in consultation today for intra abdominal fluid collection drain placement Chief Complaint  Patient presents with  . Post-op Problem  . Abdominal Pain  . Diarrhea  . Fatigue    Referring MD: CCS  Supervising Physician: Richarda Overlie  Patient Status: [facility] - In-pt  History of Present Illness: Nathan Baxter is a 72 y.o. male   Acute pancreatitis Post lap cholecystectomy 05/06/2017 +Bile leak  3/21: IMPRESSION: 1. Bile leak from the region of the cystic duct remnant. 2. Placement of a plastic biliary stent. Dr Myrtie Neither   Request made for intra abdominal fluid collection drain placement Approved by Dr Deanne Coffer  Now scheduled for same   Past Medical History:  Diagnosis Date  . Acute biliary pancreatitis   . Aneurysm of infrarenal abdominal aorta (HCC)   . Cholecystitis   . HLD (hyperlipidemia)   . HTN (hypertension)   . Prediabetes     Past Surgical History:  Procedure Laterality Date  . CHOLECYSTECTOMY N/A 05/06/2017   Procedure: LAPAROSCOPIC CHOLECYSTECTOMY;  Surgeon: Almond Lint, MD;  Location: [facility] ORS;  Service: General;  Laterality: N/A;  . ERCP N/A 05/23/2017   Procedure: ENDOSCOPIC RETROGRADE CHOLANGIOPANCREATOGRAPHY (ERCP);  Surgeon: Sherrilyn Rist, MD;  Location: [facility] ENDOSCOPY;  Service: Gastroenterology;  Laterality: N/A;    Allergies: Sulfa antibiotics  Medications: Prior to Admission medications   Medication Sig Start Date End Date Taking? Authorizing Provider  amLODipine (NORVASC) 10 MG tablet Take 1 tablet (10 mg total) by mouth daily. 05/09/17  Yes Rodolph Bong, MD  metoprolol tartrate (LOPRESSOR) 25 MG tablet Take 0.5 tablets (12.5 mg total) by mouth 2 (two) times daily. 05/08/17  Yes Rodolph Bong, MD  simethicone (MYLICON) 80 MG chewable tablet Chew 2 tablets (160 mg total) by mouth 4 (four) times daily as needed for flatulence. 05/08/17  Yes Rodolph Bong, MD  traMADol (ULTRAM) 50 MG tablet Take 1  tablet (50 mg total) by mouth every 6 (six) hours as needed for moderate pain. 05/08/17  Yes Rodolph Bong, MD     Family History  Problem Relation Age of Onset  . Breast cancer Mother   . Hypertension Father   . Hypertension Sister     Social History   Socioeconomic History  . Marital status: Single    Spouse name: Not on file  . Number of children: Not on file  . Years of education: Not on file  . Highest education level: Not on file  Occupational History  . Not on file  Social Needs  . Financial resource strain: Not on file  . Food insecurity:    Worry: Not on file    Inability: Not on file  . Transportation needs:    Medical: Not on file    Non-medical: Not on file  Tobacco Use  . Smoking status: Former Games developer  . Smokeless tobacco: Never Used  Substance and Sexual Activity  . Alcohol use: Yes    Frequency: Never  . Drug use: No  . Sexual activity: Not on file  Lifestyle  . Physical activity:    Days per week: Not on file    Minutes per session: Not on file  . Stress: Not on file  Relationships  . Social connections:    Talks on phone: Not on file    Gets together: Not on file    Attends religious service: Not on file    Active member of club or organization: Not on file  Attends meetings of clubs or organizations: Not on file    Relationship status: Not on file  Other Topics Concern  . Not on file  Social History Narrative  . Not on file    Review of Systems: A 12 point ROS discussed and pertinent positives are indicated in the HPI above.  All other systems are negative.  Review of Systems  Constitutional: Positive for activity change, appetite change and fatigue. Negative for fever and unexpected weight change.  Gastrointestinal: Positive for abdominal pain and nausea.  Neurological: Positive for weakness.  Psychiatric/Behavioral: Negative for behavioral problems and confusion.    Vital Signs: BP 127/64 (BP Location: Right Arm)   Pulse 66    Temp (!) 97.5 F (36.4 C) (Oral)   Resp 16   Ht 6' (1.829 m)   Wt 226 lb 13.7 oz (102.9 kg)   SpO2 98%   BMI 30.77 kg/m   Physical Exam  Constitutional: He is oriented to person, place, and time.  Cardiovascular: Normal rate and regular rhythm.  Pulmonary/Chest: Effort normal and breath sounds normal.  Abdominal: Soft. He exhibits distension. There is tenderness.  Neurological: He is alert and oriented to person, place, and time.  Skin: Skin is warm and dry.  Psychiatric: He has a normal mood and affect. His behavior is normal.  Nursing note and vitals reviewed.   Imaging: Nm Hepatobiliary Liver Func  Result Date: 05/22/2017 CLINICAL DATA:  History of cholecystectomy 16 days ago, now with intra-abdominal fluid worrisome for a bile leak. EXAM: NUCLEAR MEDICINE HEPATOBILIARY IMAGING TECHNIQUE: Sequential images of the abdomen were obtained out to 60 minutes following intravenous administration of radiopharmaceutical. RADIOPHARMACEUTICALS:  5.1 mCi Tc-45m  Choletec IV COMPARISON:  CT abdomen pelvis-05/22/2017 FINDINGS: There is homogeneous distribution of injected radiotracer throughout the hepatic parenchyma. There is early excretion of radiotracer with opacification of the central aspect of the biliary tree. There is pooling of radiotracer along the gallbladder fossa with eventual spillage into the right hemiabdomen extending to the level right hemiabdomen to the pelvis bilaterally. There is no definitive passage of radiotracer activity into the duodenum. IMPRESSION: Examination is positive for biliary leak as detailed above. Further evaluation with ERCP could be performed as indicated. These results will be called to the ordering clinician or representative by the Radiologist Assistant, and communication documented in the PACS or zVision Dashboard. Electronically Signed   By: Simonne Come M.D.   On: 05/22/2017 18:17   US Abdomen Complete  Result Date: 05/03/2017 CLINICAL DATA:  72 year old  male with history of pancreatitis. EXAM: ABDOMEN ULTRASOUND COMPLETE COMPARISON:  No prior abdominal ultrasound. CT the abdomen and pelvis 05/02/2017. FINDINGS: Gallbladder: No gallstones or wall thickening visualized. No sonographic Murphy sign noted by sonographer. Common bile duct: Diameter: 4.8 mm Liver: No focal lesion identified. Within normal limits in parenchymal echogenicity. Portal vein is patent on color Doppler imaging with normal direction of blood flow towards the liver. IVC: No abnormality visualized. Pancreas: Poorly visualized. Spleen: Size and appearance within normal limits. Right Kidney: Length: 12.1 cm. Echogenicity within normal limits. No mass or hydronephrosis visualized. Left Kidney: Length: 12.2 cm. Echogenicity within normal limits. No mass or hydronephrosis visualized. Abdominal aorta: Fusiform dilatation of the infrarenal abdominal aorta measuring up to 3.1 cm in diameter. Other findings: None. IMPRESSION: 1. Pancreas is poorly visualized on today's examination secondary to overlying bowel gas. 2. No acute findings. 3. Small infrarenal abdominal aortic aneurysm measuring 3.1 cm in diameter. Recommend followup by ultrasound in 3 years. This  recommendation follows ACR consensus guidelines: White Paper of the ACR Incidental Findings Committee II on Vascular Findings. J Am Coll Radiol 2013; 10:789-794. Electronically Signed   By: Trudie Reed M.D.   On: 05/03/2017 15:38   Ct Abdomen Pelvis W Contrast  Result Date: 05/22/2017 CLINICAL DATA:  Abdominal pain and distension. Diarrhea. Cholecystectomy 16 days prior. EXAM: CT ABDOMEN AND PELVIS WITH CONTRAST TECHNIQUE: Multidetector CT imaging of the abdomen and pelvis was performed using the standard protocol following bolus administration of intravenous contrast. CONTRAST:  ISOVUE-300 IOPAMIDOL (ISOVUE-300) INJECTION 61% COMPARISON:  CT 05/02/2017.  MRI 05/04/2017 FINDINGS: Lower chest: Linear atelectasis in both lower lobes. Small  right pleural effusion. There are coronary artery calcifications. Hepatobiliary: Decreased hepatic density consistent with steatosis. No focal hepatic lesion. Interval cholecystectomy from prior imaging. Small amount of free fluid in the gallbladder fossa image 33 series 3. More focal rounded fluid proximal to cholecystectomy clips measuring 1.4 x 2.3 cm may be a cystic duct remnant or postoperative seroma. No internal air. No biliary dilatation. Pancreas: Peripancreatic fat stranding and free fluid about the head and body of the pancreas. No ductal dilatation. No organized peripancreatic collection. Spleen: Normal in size without focal abnormality. Adrenals/Urinary Tract: Normal adrenal glands. No hydronephrosis or perinephric edema. Homogeneous renal enhancement with symmetric excretion on delayed phase imaging. Medial to the right kidney is a 2.9 x 2.8 cm heterogeneous low-density structure that is new from prior exam. This appears separate from the right kidney and is adjacent to the renal artery. No surrounding fat stranding. Urinary bladder is non distended without wall thickening. Stomach/Bowel: Stomach physiologically distended. No gastric wall thickening. Presence of intra-abdominal ascites partially limits bowel evaluation. Allowing for this, no evidence of bowel wall thickening, obstruction or inflammatory change. Administered enteric contrast reaches distal small bowel. Appendix is normal. Vascular/Lymphatic: 3.3 cm infrarenal abdominal aortic aneurysm, unchanged. Moderate aortic atherosclerosis. Limited assessment for adenopathy given presence of ascites. Reproductive: Prostate is unremarkable. Other: Moderate abdominopelvic ascites is new from prior exam. Fluid measures simple fluid density. No free air. No evidence of intra-abdominal abscess. Small fat containing umbilical hernia. Musculoskeletal: Stable degenerative change in the lumbar spine with endplate irregularity at L3-L4. There are no acute or  suspicious osseous abnormalities. IMPRESSION: 1. Moderate volume abdominopelvic ascites, new from prior exam. 2. Persistent peripancreatic fat stranding about the head and body, with small amount peripancreatic fluid, suspicious for pancreatitis. No focal fluid collection. 3. Small fluid density structure adjacent to surgical clips in the gallbladder fossa may be a cystic duct remnant or postoperative seroma. No surrounding inflammation or internal air to suggest abscess. 4. A 2.9 x 2.8 cm slightly heterogeneous structure medial to the right kidney is new from prior exam, suggesting hematoma. No active bleeding. 5. Chronic findings include hepatic steatosis and infrarenal abdominal aortic aneurysm with maximal dimension 3.3 cm. Electronically Signed   By: Rubye Oaks M.D.   On: 05/22/2017 04:05   Ct Abdomen Pelvis W Contrast  Result Date: 05/02/2017 CLINICAL DATA:  Acute generalized abdominal pain. EXAM: CT ABDOMEN AND PELVIS WITH CONTRAST TECHNIQUE: Multidetector CT imaging of the abdomen and pelvis was performed using the standard protocol following bolus administration of intravenous contrast. CONTRAST:  ISOVUE-300 IOPAMIDOL (ISOVUE-300) INJECTION 61% COMPARISON:  None. FINDINGS: Lower chest: No acute abnormality. Hepatobiliary: No focal liver abnormality is seen. No gallstones, gallbladder wall thickening, or biliary dilatation. Pancreas: Unremarkable. No pancreatic ductal dilatation or surrounding inflammatory changes. Spleen: Normal in size without focal abnormality. Adrenals/Urinary Tract: Adrenal  glands are unremarkable. Kidneys are normal, without renal calculi, focal lesion, or hydronephrosis. Bladder is unremarkable. Stomach/Bowel: The appendix appears normal. There is no evidence of bowel obstruction. Focal wall thickening and inflammatory changes are seen involving the posterior wall of the gastric body, which may represent peptic ulcer disease. Small amount of fluid is also seen  posterior to second portion of duodenum. Vascular/Lymphatic: 3.3 cm infrarenal abdominal aortic aneurysm is noted. Atherosclerosis of abdominal aorta is noted. No adenopathy is noted. Reproductive: Prostate is unremarkable. Other: Small fat containing periumbilical hernia is noted. Musculoskeletal: No acute or significant osseous findings. IMPRESSION: Focal wall thickening and other inflammatory changes are seen along the posterior wall of gastric body concerning for possible peptic ulcer disease. Small amount of fluid is seen posterior to the second portion of duodenum which may be related to peptic ulcer disease is well. 3.3 cm infrarenal abdominal aortic aneurysm. Recommend followup by ultrasound in 3 years. This recommendation follows ACR consensus guidelines: White Paper of the ACR Incidental Findings Committee II on Vascular Findings. J Am Coll Radiol 2013; 10:789-794. Aortic Atherosclerosis (ICD10-I70.0). Electronically Signed   By: Lupita RaiderJames  Green Jr, M.D.   On: 05/02/2017 13:20   Mr Abdomen Mrcp Wo Contrast  Result Date: 05/04/2017 CLINICAL DATA:  72 year old male with history of abnormal liver function tests. EXAM: MRI ABDOMEN WITHOUT CONTRAST  (INCLUDING MRCP) TECHNIQUE: Multiplanar multisequence MR imaging of the abdomen was performed. Heavily T2-weighted images of the biliary and pancreatic ducts were obtained, and three-dimensional MRCP images were rendered by post processing. COMPARISON:  No prior abdominal MRI. CT the abdomen and pelvis 05/02/2017. FINDINGS: Lower chest: Moderate right pleural effusion lying dependently. Increased signal intensity in portions of the right lower lobe dependently. Hepatobiliary: Diffuse loss of signal intensity throughout the hepatic parenchyma, indicative of hepatic steatosis. No suspicious cystic or solid hepatic lesions noted on today's noncontrast examination. No intra or extrahepatic biliary ductal dilatation on MRCP images. Common bile duct measures 6 mm in the  porta hepatis. No filling defects along the common bile duct to suggest choledocholithiasis. Several tiny filling defects within the gallbladder, compatible with small gallstones. No surrounding inflammatory changes to suggest an acute cholecystitis at this time. Pancreas: No definite pancreatic mass appreciated on today's noncontrast examination. Increased T2 signal intensity adjacent to the head and proximal body of the pancreas, concerning for an acute pancreatitis. No pancreatic ductal dilatation noted on MRCP images. No well-defined peripancreatic fluid collections are noted. Spleen:  Unremarkable. Adrenals/Urinary Tract: Unenhanced appearance of the kidneys and bilateral adrenal glands is unremarkable. Stomach/Bowel: No hydroureteronephrosis in the visualized portions of the abdomen. Vascular/Lymphatic: Fusiform aneurysmal dilatation of the infrarenal abdominal aorta measuring 2.9 x 3.5 cm. No lymphadenopathy noted in the abdomen. Other: Increased fluid signal intensity throughout the right side of the retroperitoneum, presumably related to acute pancreatitis. Trace volume of ascites in the right side of the abdomen. Musculoskeletal: No aggressive appearing osseous lesions are noted in the visualized portions of the skeleton. IMPRESSION: 1. No signs of biliary tract obstruction. 2. Cholelithiasis without evidence of acute cholecystitis at this time. 3. Inflammatory changes adjacent to the head and proximal body of the pancreas concerning for an acute pancreatitis. 4. Moderate right pleural effusion lying dependently with some associated passive subsegmental atelectasis in the right lower lobe. 5. Hepatic steatosis. Electronically Signed   By: Trudie Reedaniel  Entrikin M.D.   On: 05/04/2017 12:19   Mr 3d Recon At Scanner  Result Date: 05/04/2017 CLINICAL DATA:  72 year old male with history of  abnormal liver function tests. EXAM: MRI ABDOMEN WITHOUT CONTRAST  (INCLUDING MRCP) TECHNIQUE: Multiplanar multisequence MR  imaging of the abdomen was performed. Heavily T2-weighted images of the biliary and pancreatic ducts were obtained, and three-dimensional MRCP images were rendered by post processing. COMPARISON:  No prior abdominal MRI. CT the abdomen and pelvis 05/02/2017. FINDINGS: Lower chest: Moderate right pleural effusion lying dependently. Increased signal intensity in portions of the right lower lobe dependently. Hepatobiliary: Diffuse loss of signal intensity throughout the hepatic parenchyma, indicative of hepatic steatosis. No suspicious cystic or solid hepatic lesions noted on today's noncontrast examination. No intra or extrahepatic biliary ductal dilatation on MRCP images. Common bile duct measures 6 mm in the porta hepatis. No filling defects along the common bile duct to suggest choledocholithiasis. Several tiny filling defects within the gallbladder, compatible with small gallstones. No surrounding inflammatory changes to suggest an acute cholecystitis at this time. Pancreas: No definite pancreatic mass appreciated on today's noncontrast examination. Increased T2 signal intensity adjacent to the head and proximal body of the pancreas, concerning for an acute pancreatitis. No pancreatic ductal dilatation noted on MRCP images. No well-defined peripancreatic fluid collections are noted. Spleen:  Unremarkable. Adrenals/Urinary Tract: Unenhanced appearance of the kidneys and bilateral adrenal glands is unremarkable. Stomach/Bowel: No hydroureteronephrosis in the visualized portions of the abdomen. Vascular/Lymphatic: Fusiform aneurysmal dilatation of the infrarenal abdominal aorta measuring 2.9 x 3.5 cm. No lymphadenopathy noted in the abdomen. Other: Increased fluid signal intensity throughout the right side of the retroperitoneum, presumably related to acute pancreatitis. Trace volume of ascites in the right side of the abdomen. Musculoskeletal: No aggressive appearing osseous lesions are noted in the visualized  portions of the skeleton. IMPRESSION: 1. No signs of biliary tract obstruction. 2. Cholelithiasis without evidence of acute cholecystitis at this time. 3. Inflammatory changes adjacent to the head and proximal body of the pancreas concerning for an acute pancreatitis. 4. Moderate right pleural effusion lying dependently with some associated passive subsegmental atelectasis in the right lower lobe. 5. Hepatic steatosis. Electronically Signed   By: Trudie Reed M.D.   On: 05/04/2017 12:19   Dg Ercp Biliary & Pancreatic Ducts  Result Date: 05/23/2017 CLINICAL DATA:  72 year old male with bile leak EXAM: ERCP TECHNIQUE: Multiple spot images obtained with the fluoroscopic device and submitted for interpretation post-procedure. FLUOROSCOPY TIME:  Fluoroscopy Time:  4 minutes 37 seconds COMPARISON:  CT of the abdomen and pelvis 05/22/2017; nuclear medicine HIDA scan 05/22/2017 FINDINGS: A total of 12 intraoperative spot images are submitted for review. The images demonstrate a flexible endoscope in the descending duodenum with deep wire cannulation of the hepatic ducts. Subsequent cholangiogram demonstrates opacification of the biliary tree. Contrast extravasation is noted in the region of the cystic duct. On the final images, a plastic biliary stent has been placed. IMPRESSION: 1. Bile leak from the region of the cystic duct remnant. 2. Placement of a plastic biliary stent. These images were submitted for radiologic interpretation only. Please see the procedural report for the amount of contrast and the fluoroscopy time utilized. Electronically Signed   By: Malachy Moan M.D.   On: 05/23/2017 16:00    Labs:  CBC: Recent Labs    05/21/17 1611 05/22/17 0716 05/23/17 0616 05/24/17 0606  WBC 12.2* 12.9* 10.1 11.3*  HGB 14.8 13.7 12.5* 11.3*  HCT 43.5 40.6 37.2* 33.9*  PLT 648* 441* 470* 432*    COAGS: Recent Labs    05/22/17 1531  INR 1.14    BMP: Recent Labs  05/21/17 1611  05/22/17 0716 05/23/17 0616 05/24/17 0606  NA 136 135 132* 134*  K 4.4 4.4 4.2 4.2  CL 100* 104 100* 102  CO2 24 22 21* 21*  GLUCOSE 145* 142* 148* 145*  BUN 18 18 18 16   CALCIUM 9.0 8.1* 8.2* 8.6*  CREATININE 1.20 0.94 1.06 0.89  GFRNONAA 59* >60 >60 >60  GFRAA >60 >60 >60 >60    LIVER FUNCTION TESTS: Recent Labs    05/21/17 1611 05/22/17 0716 05/23/17 0616 05/24/17 0606  BILITOT 2.3* 2.2* 2.4* 2.0*  AST 59* 49* 36 28  ALT 62 46 36 28  ALKPHOS 308* 256* 243* 220*  PROT 7.2 5.8* 5.6* 5.6*  ALBUMIN 2.8* 2.2* 2.1* 1.9*    TUMOR MARKERS: No results for input(s): AFPTM, CEA, CA199, CHROMGRNA in the last 8760 hours.  Assessment and Plan:  Lap chole 05/06/2017 Now + bile leak Intra abdominal fluid collection Scheduled for drain placement Risks and benefits discussed with the patient including bleeding, infection, damage to adjacent structures, bowel perforation/fistula connection, and sepsis.  All of the patient's questions were answered, patient is agreeable to proceed. Consent signed and in chart.   Thank you for this interesting consult.  I greatly enjoyed meeting Nathan Baxter and look forward to participating in their care.  A copy of this report was sent to the requesting provider on this date.  Electronically Signed: Robet Leu, PA-C 05/24/2017, 8:54 AM   I spent a total of 40 Minutes    in face to face in clinical consultation, greater than 50% of which was counseling/coordinating care for intra abdominal fluid collection drain placement

## 2017-05-25 MED ORDER — PSYLLIUM 95 % PO PACK
1.0000 | PACK | Freq: Every day | ORAL | Status: DC
  Filled 2017-05-25 (×3): qty 1

## 2017-05-25 MED ORDER — DIPHENHYDRAMINE HCL 50 MG/ML IJ SOLN
12.5000 mg | Freq: Four times a day (QID) | INTRAMUSCULAR | Status: DC | PRN
Start: 2017-05-25 — End: 2017-05-27
  Administered 2017-05-26: 25 mg via INTRAVENOUS
  Filled 2017-05-25: qty 1

## 2017-05-25 MED ORDER — HYDROMORPHONE HCL 1 MG/ML IJ SOLN
0.5000 mg | INTRAMUSCULAR | Status: DC | PRN
  Administered 2017-05-25 – 2017-05-26 (×5): 0.5 mg via INTRAVENOUS
  Filled 2017-05-25 (×5): qty 1

## 2017-05-25 MED ORDER — METHOCARBAMOL 1000 MG/10ML IJ SOLN
1000.0000 mg | Freq: Four times a day (QID) | INTRAVENOUS | Status: DC | PRN
  Filled 2017-05-25: qty 10

## 2017-05-25 MED ORDER — GUAIFENESIN-DM 100-10 MG/5ML PO SYRP: 10.0000 mL | ORAL_SOLUTION | ORAL | Status: DC | PRN

## 2017-05-25 MED ORDER — POLYETHYLENE GLYCOL 3350 17 G PO PACK
17.0000 g | PACK | Freq: Every day | ORAL | Status: DC | PRN
  Filled 2017-05-25: qty 1

## 2017-05-25 MED ORDER — PROCHLORPERAZINE EDISYLATE 5 MG/ML IJ SOLN
5.0000 mg | INTRAMUSCULAR | Status: DC | PRN
  Filled 2017-05-25: qty 2

## 2017-05-25 MED ORDER — ALUM & MAG HYDROXIDE-SIMETH 200-200-20 MG/5ML PO SUSP: 30.0000 mL | Freq: Four times a day (QID) | ORAL | Status: DC | PRN

## 2017-05-25 MED ORDER — LACTATED RINGERS IV BOLUS (SEPSIS): 1000.0000 mL | Freq: Three times a day (TID) | INTRAVENOUS | Status: AC | PRN

## 2017-05-25 MED ORDER — NAPROXEN 250 MG PO TABS
500.0000 mg | ORAL_TABLET | Freq: Two times a day (BID) | ORAL | Status: DC
Start: 2017-05-25 — End: 2017-05-26
  Administered 2017-05-25 – 2017-05-26 (×2): 500 mg via ORAL
  Filled 2017-05-25 (×2): qty 2

## 2017-05-25 MED ORDER — SODIUM CHLORIDE 0.9 % IV SOLN: 250.0000 mL | INTRAVENOUS | Status: DC | PRN

## 2017-05-25 MED ORDER — MAGIC MOUTHWASH
15.0000 mL | Freq: Four times a day (QID) | ORAL | Status: DC | PRN
  Filled 2017-05-25: qty 15

## 2017-05-25 MED ORDER — DOCUSATE SODIUM 100 MG PO CAPS: 100.0000 mg | ORAL_CAPSULE | Freq: Every day | ORAL | Status: DC

## 2017-05-25 MED ORDER — SACCHAROMYCES BOULARDII 250 MG PO CAPS
250.0000 mg | ORAL_CAPSULE | Freq: Two times a day (BID) | ORAL | Status: DC
  Administered 2017-05-25 – 2017-05-27 (×5): 250 mg via ORAL
  Filled 2017-05-25 (×5): qty 1

## 2017-05-25 MED ORDER — PHENOL 1.4 % MT LIQD: 1.0000 | OROMUCOSAL | Status: DC | PRN

## 2017-05-25 MED ORDER — BISACODYL 10 MG RE SUPP: 10.0000 mg | Freq: Two times a day (BID) | RECTAL | Status: DC | PRN

## 2017-05-25 MED ORDER — METHOCARBAMOL 500 MG PO TABS: 1000.0000 mg | ORAL_TABLET | Freq: Four times a day (QID) | ORAL | Status: DC | PRN

## 2017-05-25 MED ORDER — MENTHOL 3 MG MT LOZG: 1.0000 | LOZENGE | OROMUCOSAL | Status: DC | PRN

## 2017-05-25 MED ORDER — IBUPROFEN 200 MG PO TABS
600.0000 mg | ORAL_TABLET | Freq: Four times a day (QID) | ORAL | Status: DC | PRN
  Administered 2017-05-25: 600 mg via ORAL
  Filled 2017-05-25: qty 3

## 2017-05-25 MED ORDER — ENSURE SURGERY PO LIQD
237.0000 mL | Freq: Two times a day (BID) | ORAL | Status: DC
  Administered 2017-05-25 – 2017-05-26 (×4): 237 mL via ORAL
  Filled 2017-05-25 (×8): qty 237

## 2017-05-25 MED ORDER — ACETAMINOPHEN 325 MG PO TABS: 650.0000 mg | ORAL_TABLET | Freq: Four times a day (QID) | ORAL | Status: DC

## 2017-05-25 MED ORDER — HYDROCORTISONE 2.5 % RE CREA: 1.0000 "application " | TOPICAL_CREAM | Freq: Four times a day (QID) | RECTAL | Status: DC | PRN

## 2017-05-25 MED ORDER — DIPHENHYDRAMINE HCL 25 MG PO CAPS: 25.0000 mg | ORAL_CAPSULE | Freq: Four times a day (QID) | ORAL | Status: DC | PRN

## 2017-05-25 MED ORDER — BLISTEX MEDICATED EX OINT
1.0000 "application " | TOPICAL_OINTMENT | Freq: Two times a day (BID) | CUTANEOUS | Status: DC
  Administered 2017-05-26 – 2017-05-28 (×3): 1 via TOPICAL
  Filled 2017-05-25: qty 6.3

## 2017-05-25 MED ORDER — GABAPENTIN 300 MG PO CAPS
300.0000 mg | ORAL_CAPSULE | Freq: Every day | ORAL | Status: DC
  Administered 2017-05-25: 300 mg via ORAL
  Filled 2017-05-25: qty 1

## 2017-05-25 MED ORDER — SODIUM CHLORIDE 0.9% FLUSH: 3.0000 mL | INTRAVENOUS | Status: DC | PRN

## 2017-05-25 MED ORDER — OXYCODONE HCL 5 MG PO TABS: 5.0000 mg | ORAL_TABLET | ORAL | Status: DC | PRN

## 2017-05-25 MED ORDER — HYDROCORTISONE 1 % EX CREA: 1.0000 "application " | TOPICAL_CREAM | Freq: Three times a day (TID) | CUTANEOUS | Status: DC | PRN

## 2017-05-25 MED ORDER — TRAMADOL HCL 50 MG PO TABS
50.0000 mg | ORAL_TABLET | Freq: Four times a day (QID) | ORAL | Status: DC | PRN
  Administered 2017-05-27 – 2017-05-29 (×4): 100 mg via ORAL
  Administered 2017-05-29: 50 mg via ORAL
  Administered 2017-05-30: 100 mg via ORAL
  Administered 2017-05-30: 50 mg via ORAL
  Filled 2017-05-25 (×2): qty 2
  Filled 2017-05-25: qty 1
  Filled 2017-05-25 (×3): qty 2
  Filled 2017-05-25: qty 1
  Filled 2017-05-25: qty 2

## 2017-05-25 MED ORDER — SODIUM CHLORIDE 0.9% FLUSH
3.0000 mL | Freq: Two times a day (BID) | INTRAVENOUS | Status: DC
  Administered 2017-05-25 – 2017-05-28 (×6): 3 mL via INTRAVENOUS

## 2017-05-25 NOTE — Progress Notes (Addendum)
[facility] Progress Note  Chief Complaint: Bile leak  History:  Nathan MaduroRobert had placement of a gallbladder fossa drain for his bile leak yesterday.  He has over 4500 cc of drainage since then.  There was 4560 cc in the first several hours after placement, and just 200 cc overnight.  His abdominal pain has mostly improved, but is more so now on the right after drain placement.  He denies chest pain or dyspnea.  He again expresses concerns about pain control, he does not want to take opioid pain meds at home, and concerned about what his outpatient follow-up will be. I saw him at the same time as Dr. Star AgeStephen Baxter of surgery today.  Objective:  Med list reviewed  Vital signs in last 24 hrs: Vitals:   05/24/17 2013 05/25/17 0526  BP: 132/69 134/78  Pulse: 87 66  Resp: 17 17  Temp: 98 F (36.7 C) 97.8 F (36.6 C)  SpO2: 100% 97%    Physical Exam   HEENT: sclera anicteric, oral mucosa moist without lesions  Neck: supple, no thyromegaly, JVD or lymphadenopathy  Cardiac: RRR without murmurs, S1S2 heard, no peripheral edema  Pulm: clear to auscultation bilaterally, normal RR and effort noted  Abdomen: soft, right-sided tenderness, with active bowel sounds. No guarding or palpable hepatosplenomegaly.  Biliary drain with non-purulent bile  Skin; warm and dry, no jaundice or rash  Recent Labs:  Recent Labs  Lab 05/22/17 0716 05/23/17 0616 05/24/17 0606  WBC 12.9* 10.1 11.3*  HGB 13.7 12.5* 11.3*  HCT 40.6 37.2* 33.9*  PLT 441* 470* 432*   Recent Labs  Lab 05/24/17 0606  NA 134*  K 4.2  CL 102  CO2 21*  BUN 16  ALBUMIN 1.9*  ALKPHOS 220*  ALT 28  AST 28  GLUCOSE 145*   Recent Labs  Lab 05/22/17 1531  INR 1.14    Radiologic studies: IR study report reviewed  @ASSESSMENTPLANBEGIN @ Assessment: Bile Leak Elevated LFTs from bile leak, no CBD stone seen on ERCP 3/21 Generalized abdominal pain from bile leak peritonitis is improved Right upper quadrant  abdominal pain since drain placement yesterday  He is improved overall, though LFTs remain similar.  Plan: Follow drain output LFTs every 2 or 3 days. He will need this biliary stent in for longer than usual given the degree of bile leak, perhaps about 6 weeks.  The exact timing remains to be seen depending upon his clinical progress and output from the biliary drain and any further interpretation of the drain or HIDA scan testing that happens in the future. I also discontinued his IV fluids as he is eating and drinking. We will follow him while hospitalized.   Nathan Baxter Pager [phone] Mon-Fri 8a-5p [phone] after 5p, weekends, holidays

## 2017-05-25 NOTE — Progress Notes (Signed)
[facility]/Trauma Progress Note  2 Days Post-Op   Assessment/Plan HTN HLD Prediabetes  Acute pancreatitis, bile leak, s/p laparoscopic cholecystectomy 05/06/17 Dr. Donell Beers -CT scan shows persistent peripancreatic fat stranding about the head and body with small amount peripancreatic fluid suspicious for pancreatitis,focal rounded fluid proximal to cholecystectomy clips measuring 1.4 x 2.3 cm may be a cystic duct remnant or postoperative seroma,Small amount of free fluid in the gallbladder fossa, HIDA + for leak - WBC 11.3 (03/22), VSS - LFTs WNL, TB 2.0 - S/P ERCP, 03/21 - S/P drain via IR, 03/22, high output  ID -zosyn 3/20>> VTE -SCDs FEN -IVF, NPO Foley -none  Plan -Drain placement yesterday with high output. Will watch overnight with plans to dc tomorrow. AM labs     LOS: 3 days    Subjective: CC: RUQ abdominal pain  Pain well controlled with dilaudid. Pt asking for PO at discharge. I said no. He does not want oxy or tramadol. He wants ibuprofen. Tolerating diet. No flatus or BM today. No issues overnight.   Objective: Vital signs in last 24 hours: Temp:  [97.8 F (36.6 C)-98 F (36.7 C)] 97.8 F (36.6 C) (03/23 0526) Pulse Rate:  [66-87] 66 (03/23 0526) Resp:  [16-25] 17 (03/23 0526) BP: (120-140)/(69-85) 134/78 (03/23 0526) SpO2:  [97 %-100 %] 97 % (03/23 0526) Last BM Date: 05/21/17  Intake/Output from previous day: 03/22 0701 - 03/23 0700 In: 240 [P.O.:240] Out: 4760 [Drains:4760] Intake/Output this shift: No intake/output data recorded.  PE: Gen:  Alert, NAD, pleasant, cooperative Card:  RRR Pulm:  CTA, no W/R/R, effort normal Abd: Soft, obese, mild distention, +BS, incisions are well appearing, drain site C/D/I, drain with scant bilious drainage, TTP in RUQ, no guarding or peritonitis.  Skin: no rashes noted, warm and dry   Anti-infectives: Anti-infectives (From admission, onward)   Start     Dose/Rate Route Frequency  Ordered Stop   05/22/17 1600  piperacillin-tazobactam (ZOSYN) IVPB 3.375 g  Status:  Discontinued     3.375 g 12.5 mL/hr over 240 Minutes Intravenous Every 8 hours 05/22/17 0816 05/24/17 1033   05/22/17 0830  piperacillin-tazobactam (ZOSYN) IVPB 3.375 g     3.375 g 100 mL/hr over 30 Minutes Intravenous  Once 05/22/17 0816 05/22/17 1138      Lab Results:  Recent Labs    05/23/17 0616 05/24/17 0606  WBC 10.1 11.3*  HGB 12.5* 11.3*  HCT 37.2* 33.9*  PLT 470* 432*   BMET Recent Labs    05/23/17 0616 05/24/17 0606  NA 132* 134*  K 4.2 4.2  CL 100* 102  CO2 21* 21*  GLUCOSE 148* 145*  BUN 18 16  CREATININE 1.06 0.89  CALCIUM 8.2* 8.6*   PT/INR Recent Labs    05/22/17 1531  LABPROT 14.5  INR 1.14   CMP     Component Value Date/Time   NA 134 (L) 05/24/2017 0606   K 4.2 05/24/2017 0606   CL 102 05/24/2017 0606   CO2 21 (L) 05/24/2017 0606   GLUCOSE 145 (H) 05/24/2017 0606   BUN 16 05/24/2017 0606   CREATININE 0.89 05/24/2017 0606   CALCIUM 8.6 (L) 05/24/2017 0606   PROT 5.6 (L) 05/24/2017 0606   ALBUMIN 1.9 (L) 05/24/2017 0606   AST 28 05/24/2017 0606   ALT 28 05/24/2017 0606   ALKPHOS 220 (H) 05/24/2017 0606   BILITOT 2.0 (H) 05/24/2017 0606   GFRNONAA >60 05/24/2017 0606   GFRAA >60 05/24/2017 0606   Lipase  Component Value Date/Time   LIPASE 40 05/24/2017 0606    Studies/Results: Dg Ercp Biliary & Pancreatic Ducts  Result Date: 05/23/2017 CLINICAL DATA:  72 year old male with bile leak EXAM: ERCP TECHNIQUE: Multiple spot images obtained with the fluoroscopic device and submitted for interpretation post-procedure. FLUOROSCOPY TIME:  Fluoroscopy Time:  4 minutes 37 seconds COMPARISON:  CT of the abdomen and pelvis 05/22/2017; nuclear medicine HIDA scan 05/22/2017 FINDINGS: A total of 12 intraoperative spot images are submitted for review. The images demonstrate a flexible endoscope in the descending duodenum with deep wire cannulation of the hepatic  ducts. Subsequent cholangiogram demonstrates opacification of the biliary tree. Contrast extravasation is noted in the region of the cystic duct. On the final images, a plastic biliary stent has been placed. IMPRESSION: 1. Bile leak from the region of the cystic duct remnant. 2. Placement of a plastic biliary stent. These images were submitted for radiologic interpretation only. Please see the procedural report for the amount of contrast and the fluoroscopy time utilized. Electronically Signed   By: Malachy MoanHeath  McCullough M.D.   On: 05/23/2017 16:00   Ct Image Guided Drainage By Percutaneous Catheter  Result Date: 05/24/2017 INDICATION: 72 year old with history of cholecystectomy and bile leak. Patient has a large amount of intra-abdominal fluid. Recently placed endoscopic biliary stent. EXAM: CT-GUIDED PLACEMENT OF DRAINAGE CATHETER WITHIN THE RIGHT ABDOMINAL FLUID COLLECTION MEDICATIONS: Sedation medications ANESTHESIA/SEDATION: 2.0 mg IV Versed 100 mcg IV Fentanyl Moderate Sedation Time:  24 minutes The patient was continuously monitored during the procedure by the interventional radiology nurse under my direct supervision. COMPLICATIONS: None immediate. TECHNIQUE: Informed written consent was obtained from the patient after a thorough discussion of the procedural risks, benefits and alternatives. All questions were addressed. A timeout was performed prior to the initiation of the procedure. PROCEDURE: Patient was placed supine on CT scanner. Images through the abdomen were obtained. The right side of the abdomen was prepped with chlorhexidine and sterile field was created. Skin and soft tissues were anesthetized with 1% lidocaine. 18 gauge trocar needle was directed into the right abdominal fluid collection with CT guidance. Orange/brown colored fluid was aspirated. Stiff Amplatz wire was placed. The tract was dilated to accommodate a 12 [place] drain. Catheter was attached to a gravity bag and greater than 500 mL  of fluid was within the gravity bag by the end of the procedure. Catheter was sutured to skin. FINDINGS: Large amount of abdominal fluid, particularly around the liver and just inferior to the right hepatic lobe. Orange/brown fluid was removed from the abdominal cavity. Findings compatible with history of a bile leak. Sample of the fluid was sent for culture. IMPRESSION: CT-guided placement of a drainage catheter within the right abdominal fluid collection. Electronically Signed   By: Richarda OverlieAdam  Henn M.D.   On: 05/24/2017 18:47      Jerre SimonJessica L Jerran Tappan , [facility] 05/25/2017, 9:22 AM  Pager: [phone] Mon-Wed, Friday 7:00am-4:30pm Thurs 7am-11:30am  Consults: [phone]

## 2017-05-26 ENCOUNTER — Encounter (HOSPITAL_COMMUNITY): Payer: Self-pay | Admitting: *Deleted

## 2017-05-26 DIAGNOSIS — E43 Unspecified severe protein-calorie malnutrition: Secondary | ICD-10-CM

## 2017-05-26 DIAGNOSIS — K839 Disease of biliary tract, unspecified: Secondary | ICD-10-CM

## 2017-05-26 LAB — COMPREHENSIVE METABOLIC PANEL
ALBUMIN: 1.7 g/dL — AB (ref 3.5–5.0)
ALT: 41 U/L (ref 17–63)
ANION GAP: 7 (ref 5–15)
AST: 38 U/L (ref 15–41)
Alkaline Phosphatase: 183 U/L — ABNORMAL HIGH (ref 38–126)
BILIRUBIN TOTAL: 1.2 mg/dL (ref 0.3–1.2)
BUN: 14 mg/dL (ref 6–20)
CO2: 27 mmol/L (ref 22–32)
Calcium: 8.1 mg/dL — ABNORMAL LOW (ref 8.9–10.3)
Chloride: 102 mmol/L (ref 101–111)
Creatinine, Ser: 0.65 mg/dL (ref 0.61–1.24)
GFR calc Af Amer: >60 mL/min (ref >60)
GLUCOSE: 131 mg/dL — AB (ref 65–99)
POTASSIUM: 3.7 mmol/L (ref 3.5–5.1)
Sodium: 136 mmol/L (ref 135–145)
TOTAL PROTEIN: 5 g/dL — AB (ref 6.5–8.1)

## 2017-05-26 LAB — CBC
HEMATOCRIT: 34.2 % — AB (ref 39.0–52.0)
HEMOGLOBIN: 11.4 g/dL — AB (ref 13.0–17.0)
MCH: 28.4 pg (ref 26.0–34.0)
MCHC: 33.3 g/dL (ref 30.0–36.0)
MCV: 85.3 fL (ref 78.0–100.0)
Platelets: 472 10*3/uL — ABNORMAL HIGH (ref 150–400)
RBC: 4.01 MIL/uL — ABNORMAL LOW (ref 4.22–5.81)
RDW: 13.4 % (ref 11.5–15.5)
WBC: 11.2 10*3/uL — AB (ref 4.0–10.5)

## 2017-05-26 MED ORDER — HYDROMORPHONE HCL 1 MG/ML IJ SOLN: 0.5000 mg | INTRAMUSCULAR | Status: DC | PRN

## 2017-05-26 MED ORDER — METHOCARBAMOL 750 MG PO TABS
750.0000 mg | ORAL_TABLET | Freq: Three times a day (TID) | ORAL | Status: DC
  Administered 2017-05-26 – 2017-05-27 (×4): 750 mg via ORAL
  Filled 2017-05-26 (×4): qty 1

## 2017-05-26 MED ORDER — NAPROXEN 250 MG PO TABS
500.0000 mg | ORAL_TABLET | Freq: Three times a day (TID) | ORAL | Status: DC
  Administered 2017-05-26 – 2017-05-27 (×3): 500 mg via ORAL
  Filled 2017-05-26 (×4): qty 2

## 2017-05-26 MED ORDER — HYDROCODONE-ACETAMINOPHEN 7.5-325 MG PO TABS
1.0000 | ORAL_TABLET | ORAL | Status: DC | PRN
  Administered 2017-05-26: 1 via ORAL
  Administered 2017-05-26: 2 via ORAL
  Filled 2017-05-26: qty 2
  Filled 2017-05-26: qty 1

## 2017-05-26 MED ORDER — GABAPENTIN 300 MG PO CAPS
300.0000 mg | ORAL_CAPSULE | Freq: Three times a day (TID) | ORAL | Status: DC
  Administered 2017-05-26 – 2017-05-27 (×4): 300 mg via ORAL
  Filled 2017-05-26 (×4): qty 1

## 2017-05-26 MED ORDER — HYDROCODONE-ACETAMINOPHEN 7.5-325 MG PO TABS: 1.0000 | ORAL_TABLET | Freq: Four times a day (QID) | ORAL | Status: DC | PRN

## 2017-05-26 NOTE — Progress Notes (Signed)
[facility]/Trauma Progress Note  3 Days Post-Op   Assessment/Plan HTN HLD Prediabetes  Acute pancreatitis, bile leak,s/p laparoscopic cholecystectomy 05/06/17 Dr. Donell Beers -CT scan shows persistent peripancreatic fat stranding about the head and body with small amount peripancreatic fluid suspicious for pancreatitis,focal rounded fluid proximal to cholecystectomy clips measuring 1.4 x 2.3 cm may be a cystic duct remnant or postoperative seroma,Small amount of free fluid in the gallbladder fossa, HIDA + for leak - WBC 11.3 (03/22), VSS -LFTs WNL, TB 2.0 -S/P ERCP, 03/21 - S/P drain via IR, 03/22, high output  ID -zosyn 3/20>> VTE -SCDs FEN -IVF, carb modified Foley -none  Plan -Drain output minimal in last 18 hours. Pain not controlled, added Norco.   Per Danis: stent removed and possibly repeat ERCP as well to confirm resolution of bile leak.  This will most likely be about 4-6 weeks from now.    LOS: 4 days    Subjective: CC: RUQ abdominal pain  Pain not well controlled and pt is using IV dilaudid q3hrs. Added Norco this am. He states nothing else helps his pain by dilaudid. NO nausea or vomiting and tolerating diet. Having BM's. Pt states Dr. Myrtie Neither is going to have IR look at his drain to see why he is having so much pain.  Objective: Vital signs in last 24 hours: Temp:  [97.8 F (36.6 C)-98.3 F (36.8 C)] 97.8 F (36.6 C) (03/24 0609) Pulse Rate:  [72-81] 73 (03/24 0609) Resp:  [16-17] 16 (03/24 0609) BP: (122-133)/(72-81) 122/81 (03/24 0609) SpO2:  [98 %-99 %] 98 % (03/24 0609) Last BM Date: 05/25/17  Intake/Output from previous day: 03/23 0701 - 03/24 0700 In: 480 [P.O.:480] Out: -  Intake/Output this shift: No intake/output data recorded.  PE: Gen:  Alert, NAD, pleasant, cooperative Card:  RRR Pulm:  CTA, no W/R/R, effort normal Abd: Soft, obese, no distention, +BS, drain site C/D/I, drain with scant bilious drainage, TTP in  RUQ, no guarding or peritonitis.  Skin: no rashes noted, warm and dry  Anti-infectives: Anti-infectives (From admission, onward)   Start     Dose/Rate Route Frequency Ordered Stop   05/22/17 1600  piperacillin-tazobactam (ZOSYN) IVPB 3.375 g  Status:  Discontinued     3.375 g 12.5 mL/hr over 240 Minutes Intravenous Every 8 hours 05/22/17 0816 05/24/17 1033   05/22/17 0830  piperacillin-tazobactam (ZOSYN) IVPB 3.375 g     3.375 g 100 mL/hr over 30 Minutes Intravenous  Once 05/22/17 0816 05/22/17 1138      Lab Results:  Recent Labs    05/24/17 0606 05/26/17 0631  WBC 11.3* 11.2*  HGB 11.3* 11.4*  HCT 33.9* 34.2*  PLT 432* 472*   BMET Recent Labs    05/24/17 0606 05/26/17 0631  NA 134* 136  K 4.2 3.7  CL 102 102  CO2 21* 27  GLUCOSE 145* 131*  BUN 16 14  CREATININE 0.89 0.65  CALCIUM 8.6* 8.1*   PT/INR No results for input(s): LABPROT, INR in the last 72 hours. CMP     Component Value Date/Time   NA 136 05/26/2017 0631   K 3.7 05/26/2017 0631   CL 102 05/26/2017 0631   CO2 27 05/26/2017 0631   GLUCOSE 131 (H) 05/26/2017 0631   BUN 14 05/26/2017 0631   CREATININE 0.65 05/26/2017 0631   CALCIUM 8.1 (L) 05/26/2017 0631   PROT 5.0 (L) 05/26/2017 0631   ALBUMIN 1.7 (L) 05/26/2017 0631   AST 38 05/26/2017 0631   ALT 41 05/26/2017  0631   ALKPHOS 183 (H) 05/26/2017 0631   BILITOT 1.2 05/26/2017 0631   GFRNONAA >60 05/26/2017 0631   GFRAA >60 05/26/2017 0631   Lipase     Component Value Date/Time   LIPASE 40 05/24/2017 0606    Studies/Results: Ct Image Guided Drainage By Percutaneous Catheter  Result Date: 05/24/2017 INDICATION: 72 year old with history of cholecystectomy and bile leak. Patient has a large amount of intra-abdominal fluid. Recently placed endoscopic biliary stent. EXAM: CT-GUIDED PLACEMENT OF DRAINAGE CATHETER WITHIN THE RIGHT ABDOMINAL FLUID COLLECTION MEDICATIONS: Sedation medications ANESTHESIA/SEDATION: 2.0 mg IV Versed 100 mcg IV Fentanyl  Moderate Sedation Time:  24 minutes The patient was continuously monitored during the procedure by the interventional radiology nurse under my direct supervision. COMPLICATIONS: None immediate. TECHNIQUE: Informed written consent was obtained from the patient after a thorough discussion of the procedural risks, benefits and alternatives. All questions were addressed. A timeout was performed prior to the initiation of the procedure. PROCEDURE: Patient was placed supine on CT scanner. Images through the abdomen were obtained. The right side of the abdomen was prepped with chlorhexidine and sterile field was created. Skin and soft tissues were anesthetized with 1% lidocaine. 18 gauge trocar needle was directed into the right abdominal fluid collection with CT guidance. Orange/brown colored fluid was aspirated. Stiff Amplatz wire was placed. The tract was dilated to accommodate a 12 [place] drain. Catheter was attached to a gravity bag and greater than 500 mL of fluid was within the gravity bag by the end of the procedure. Catheter was sutured to skin. FINDINGS: Large amount of abdominal fluid, particularly around the liver and just inferior to the right hepatic lobe. Orange/brown fluid was removed from the abdominal cavity. Findings compatible with history of a bile leak. Sample of the fluid was sent for culture. IMPRESSION: CT-guided placement of a drainage catheter within the right abdominal fluid collection. Electronically Signed   By: Richarda OverlieAdam  Henn M.D.   On: 05/24/2017 18:47      Jerre SimonJessica L Kazimierz Springborn , [facility] 05/26/2017, 9:17 AM  Pager: [phone] Mon-Wed, Friday 7:00am-4:30pm Thurs 7am-11:30am  Consults: [phone]

## 2017-05-26 NOTE — Progress Notes (Signed)
 GI Progress Note  Chief Complaint: Bile leak  History:  Nathan MaduroRobert continues to complain of right upper quadrant abdominal pain radiating to the back where he cannot get comfortable in almost any position.  He denies chest pain or dyspnea.  He is eating well without nausea or vomiting. There is no output of the drain recorded for the last 24 hours.  Patient reports that the bag has not been emptied and there seems to been no more drainage since I last saw him yesterday morning. ROS: Cardiovascular:  no chest pain Respiratory: no dyspnea  Objective:  Med list reviewed  Vital signs in last 24 hrs: Vitals:   05/25/17 2308 05/26/17 0609  BP: 133/72 122/81  Pulse: 81 73  Resp: 16 16  Temp: 98.3 F (36.8 C) 97.8 F (36.6 C)  SpO2: 98% 98%    Physical Exam  He is visibly uncomfortable  HEENT: sclera anicteric, oral mucosa moist without lesions  Neck: supple, no thyromegaly, JVD or lymphadenopathy  Cardiac: RRR without murmurs, S1S2 heard, no peripheral edema  Pulm: clear to auscultation bilaterally, normal RR and effort noted  Abdomen: soft, RUQ tenderness at drain site, with active bowel sounds. No guarding or palpable hepatosplenomegaly, limited by body habitus  Skin; warm and dry, no jaundice or rash  Recent Labs:  Recent Labs  Lab 05/23/17 0616 05/24/17 0606 05/26/17 0631  WBC 10.1 11.3* 11.2*  HGB 12.5* 11.3* 11.4*  HCT 37.2* 33.9* 34.2*  PLT 470* 432* 472*   Recent Labs  Lab 05/24/17 0606  NA 134*  K 4.2  CL 102  CO2 21*  BUN 16  ALBUMIN 1.9*  ALKPHOS 220*  ALT 28  AST 28  GLUCOSE 145*   Recent Labs  Lab 05/22/17 1531  INR 1.14    @ASSESSMENTPLANBEGIN @ Assessment: Bile leak, treated with ERCP and biliary stent placement 05/23/2017 Abnormal LFTs, this morning's result not yet back.  They were decreasing as of yesterday Right upper quadrant pain, apparently from this drain.  The biliary stent appears to be working.  However, given his  pain and no drain output over the last day, the drain should be flushed to ensure that it is working properly. Plan: Surgery to follow-up with this patient as well and determine when the drain can be removed. As previously noted, he will need the stent removed and possibly repeat ERCP as well to confirm resolution of bile leak.  This will most likely be about 4-6 weeks from now.  Exact timing to be determined based on his clinical progress. Dr. Christella HartiganJacobs to assume consult service tomorrow.   Charlie PitterHenry L Danis III Pager [phone] Mon-Fri 8a-5p [phone] after 5p, weekends, holidays

## 2017-05-27 ENCOUNTER — Inpatient Hospital Stay (HOSPITAL_COMMUNITY): Payer: Medicare HMO

## 2017-05-27 ENCOUNTER — Encounter (HOSPITAL_COMMUNITY): Payer: Self-pay | Admitting: *Deleted

## 2017-05-27 DIAGNOSIS — K9189 Other postprocedural complications and disorders of digestive system: Principal | ICD-10-CM

## 2017-05-27 LAB — COMPREHENSIVE METABOLIC PANEL
ALBUMIN: 1.7 g/dL — AB (ref 3.5–5.0)
ALT: 45 U/L (ref 17–63)
AST: 38 U/L (ref 15–41)
Alkaline Phosphatase: 167 U/L — ABNORMAL HIGH (ref 38–126)
Anion gap: 10 (ref 5–15)
BILIRUBIN TOTAL: 1.1 mg/dL (ref 0.3–1.2)
BUN: 13 mg/dL (ref 6–20)
CHLORIDE: 103 mmol/L (ref 101–111)
CO2: 23 mmol/L (ref 22–32)
Calcium: 8.2 mg/dL — ABNORMAL LOW (ref 8.9–10.3)
Creatinine, Ser: 0.62 mg/dL (ref 0.61–1.24)
GFR calc Af Amer: >60 mL/min (ref >60)
GFR calc non Af Amer: >60 mL/min (ref >60)
GLUCOSE: 161 mg/dL — AB (ref 65–99)
Potassium: 3.8 mmol/L (ref 3.5–5.1)
Sodium: 136 mmol/L (ref 135–145)
TOTAL PROTEIN: 5 g/dL — AB (ref 6.5–8.1)

## 2017-05-27 LAB — CBC
HEMATOCRIT: 33.9 % — AB (ref 39.0–52.0)
HEMOGLOBIN: 11.3 g/dL — AB (ref 13.0–17.0)
MCH: 28 pg (ref 26.0–34.0)
MCHC: 33.3 g/dL (ref 30.0–36.0)
MCV: 83.9 fL (ref 78.0–100.0)
Platelets: 399 10*3/uL (ref 150–400)
RBC: 4.04 MIL/uL — ABNORMAL LOW (ref 4.22–5.81)
RDW: 13.1 % (ref 11.5–15.5)
WBC: 17.2 10*3/uL — AB (ref 4.0–10.5)

## 2017-05-27 LAB — CULTURE, BLOOD (ROUTINE X 2)
Culture: NO GROWTH
Special Requests: ADEQUATE

## 2017-05-27 MED ORDER — IOPAMIDOL (ISOVUE-300) INJECTION 61%
INTRAVENOUS | Status: AC
  Administered 2017-05-27: 100 mL
  Filled 2017-05-27: qty 100

## 2017-05-27 MED ORDER — ACETAMINOPHEN 500 MG PO TABS
1000.0000 mg | ORAL_TABLET | Freq: Four times a day (QID) | ORAL | Status: DC | PRN
  Administered 2017-05-28 – 2017-05-29 (×7): 1000 mg via ORAL
  Filled 2017-05-27 (×8): qty 2

## 2017-05-27 NOTE — Progress Notes (Signed)
Orders received from Dr Donell BeersByerly to give patient 1000 mg of PO tylenol every 6 hours as needed.

## 2017-05-27 NOTE — Care Management Important Message (Signed)
Important Message  Patient Details  Name: Nathan SciaraRobert Manygoats MRN: [id number] Date of Birth: 05/07/1945   Medicare Important Message Given:  Yes    Dorena BodoIris Nashton Belson 05/27/2017, 2:48 PM

## 2017-05-27 NOTE — Progress Notes (Signed)
Referring Physician(s): [facility] Dr Edythe Lynn  Supervising Physician: Ruel Favors  Patient Status:  [facility] - In-pt  Chief Complaint:  3/22: CT guided placement of 12 french drain in right abdominal fluid collection.  Brown/orange fluid was rapidly draining into gravity bag.  Subjective:  Resting abd pain is less Still has pinpoint pain near epigastric area  OP is minimal 25 cc yesterday: brownish yellow Flushing 10 cc daily Flushing causes no pain Afeb but wbc higher (17) Ambulating in room   Allergies: Sulfa antibiotics  Medications: Prior to Admission medications   Medication Sig Start Date End Date Taking? Authorizing Provider  amLODipine (NORVASC) 10 MG tablet Take 1 tablet (10 mg total) by mouth daily. 05/09/17  Yes Rodolph Bong, MD  metoprolol tartrate (LOPRESSOR) 25 MG tablet Take 0.5 tablets (12.5 mg total) by mouth 2 (two) times daily. 05/08/17  Yes Rodolph Bong, MD  simethicone (MYLICON) 80 MG chewable tablet Chew 2 tablets (160 mg total) by mouth 4 (four) times daily as needed for flatulence. 05/08/17  Yes Rodolph Bong, MD  traMADol (ULTRAM) 50 MG tablet Take 1 tablet (50 mg total) by mouth every 6 (six) hours as needed for moderate pain. 05/08/17  Yes Rodolph Bong, MD     Vital Signs: BP 124/60 (BP Location: Right Arm)   Pulse 76   Temp 98 F (36.7 C) (Oral)   Resp 17   Ht 6' (1.829 m)   Wt 226 lb 13.7 oz (102.9 kg)   SpO2 98%   BMI 30.77 kg/m   Physical Exam  Abdominal: Soft. Normal appearance. There is no rebound and no guarding.  Skin: Skin is warm and dry.  Site of drain is clean and dry NT no bleeding OP minimal in bag Brownish/yellow No growth  Mild tenderness right of epigastric area   Nursing note and vitals reviewed.   Imaging: Dg Ercp Biliary & Pancreatic Ducts  Result Date: 05/23/2017 CLINICAL DATA:  72 year old male with bile leak EXAM: ERCP TECHNIQUE: Multiple spot images obtained with the fluoroscopic device  and submitted for interpretation post-procedure. FLUOROSCOPY TIME:  Fluoroscopy Time:  4 minutes 37 seconds COMPARISON:  CT of the abdomen and pelvis 05/22/2017; nuclear medicine HIDA scan 05/22/2017 FINDINGS: A total of 12 intraoperative spot images are submitted for review. The images demonstrate a flexible endoscope in the descending duodenum with deep wire cannulation of the hepatic ducts. Subsequent cholangiogram demonstrates opacification of the biliary tree. Contrast extravasation is noted in the region of the cystic duct. On the final images, a plastic biliary stent has been placed. IMPRESSION: 1. Bile leak from the region of the cystic duct remnant. 2. Placement of a plastic biliary stent. These images were submitted for radiologic interpretation only. Please see the procedural report for the amount of contrast and the fluoroscopy time utilized. Electronically Signed   By: Malachy Moan M.D.   On: 05/23/2017 16:00   Ct Image Guided Drainage By Percutaneous Catheter  Result Date: 05/24/2017 INDICATION: 72 year old with history of cholecystectomy and bile leak. Patient has a large amount of intra-abdominal fluid. Recently placed endoscopic biliary stent. EXAM: CT-GUIDED PLACEMENT OF DRAINAGE CATHETER WITHIN THE RIGHT ABDOMINAL FLUID COLLECTION MEDICATIONS: Sedation medications ANESTHESIA/SEDATION: 2.0 mg IV Versed 100 mcg IV Fentanyl Moderate Sedation Time:  24 minutes The patient was continuously monitored during the procedure by the interventional radiology nurse under my direct supervision. COMPLICATIONS: None immediate. TECHNIQUE: Informed written consent was obtained from the patient after a thorough discussion of  the procedural risks, benefits and alternatives. All questions were addressed. A timeout was performed prior to the initiation of the procedure. PROCEDURE: Patient was placed supine on CT scanner. Images through the abdomen were obtained. The right side of the abdomen was prepped with  chlorhexidine and sterile field was created. Skin and soft tissues were anesthetized with 1% lidocaine. 18 gauge trocar needle was directed into the right abdominal fluid collection with CT guidance. Orange/brown colored fluid was aspirated. Stiff Amplatz wire was placed. The tract was dilated to accommodate a 12 [place] drain. Catheter was attached to a gravity bag and greater than 500 mL of fluid was within the gravity bag by the end of the procedure. Catheter was sutured to skin. FINDINGS: Large amount of abdominal fluid, particularly around the liver and just inferior to the right hepatic lobe. Orange/brown fluid was removed from the abdominal cavity. Findings compatible with history of a bile leak. Sample of the fluid was sent for culture. IMPRESSION: CT-guided placement of a drainage catheter within the right abdominal fluid collection. Electronically Signed   By: Richarda OverlieAdam  Henn M.D.   On: 05/24/2017 18:47    Labs:  CBC: Recent Labs    05/23/17 0616 05/24/17 0606 05/26/17 0631 05/27/17 0655  WBC 10.1 11.3* 11.2* 17.2*  HGB 12.5* 11.3* 11.4* 11.3*  HCT 37.2* 33.9* 34.2* 33.9*  PLT 470* 432* 472* 399    COAGS: Recent Labs    05/22/17 1531  INR 1.14    BMP: Recent Labs    05/23/17 0616 05/24/17 0606 05/26/17 0631 05/27/17 0655  NA 132* 134* 136 136  K 4.2 4.2 3.7 3.8  CL 100* 102 102 103  CO2 21* 21* 27 23  GLUCOSE 148* 145* 131* 161*  BUN 18 16 14 13   CALCIUM 8.2* 8.6* 8.1* 8.2*  CREATININE 1.06 0.89 0.65 0.62  GFRNONAA >60 >60 >60 >60  GFRAA >60 >60 >60 >60    LIVER FUNCTION TESTS: Recent Labs    05/23/17 0616 05/24/17 0606 05/26/17 0631 05/27/17 0655  BILITOT 2.4* 2.0* 1.2 1.1  AST 36 28 38 38  ALT 36 28 41 45  ALKPHOS 243* 220* 183* 167*  PROT 5.6* 5.6* 5.0* 5.0*  ALBUMIN 2.1* 1.9* 1.7* 1.7*    Assessment and Plan:  Intra abdominal biloma collection Drain placed 3/22 Minimal OP Leukocytosis CCS to recheck CT   Electronically Signed: Cline Draheim  A, PA-C 05/27/2017, 1:34 PM   I spent a total of 15 Minutes at the the patient's bedside AND on the patient's hospital floor or unit, greater than 50% of which was counseling/coordinating care for Intra abd fluid collection

## 2017-05-27 NOTE — Progress Notes (Signed)
[facility] Progress Note  4 Days Post-Op  Subjective: CC-  Patient states that he feels much better today. He reports mild, dull RUQ abdominal pain at times, but it is significantly improved. Denies n/v. BM yesterday. Tolerating regular diet. He is ambulating without issues. Asking when he will be able to go home.  Perc drain in place with minimal output, recorded 25cc over the last 24 hours but 10-20cc is from flushing the drain. WBC up to 17.2, afebrile. Denies CP, SOB, cough, dysuria.  Objective: Vital signs in last 24 hours: Temp:  [97.6 F (36.4 C)-98.4 F (36.9 C)] 98 F (36.7 C) (03/25 0544) Pulse Rate:  [76-83] 76 (03/25 0544) Resp:  [16-18] 17 (03/25 0544) BP: (121-132)/(60-74) 124/60 (03/25 0544) SpO2:  [98 %-100 %] 98 % (03/25 0544) Last BM Date: 05/21/17  Intake/Output from previous day: 03/24 0701 - 03/25 0700 In: 500 [P.O.:480] Out: 25 [Drains:25] Intake/Output this shift: Total I/O In: 360 [P.O.:360] Out: -   PE: Gen:  Alert, NAD HEENT: EOM's intact, pupils equal and round Card:  RRR, no M/G/R heard Pulm:  CTAB, no W/R/R, effort normal Abd: Soft, mild distension, nontender, +BS, RUQ drain with minimal golden bile-tinged fluid Psych: A&Ox3  Skin: no rashes noted, warm and dry  Lab Results:  Recent Labs    05/26/17 0631 05/27/17 0655  WBC 11.2* 17.2*  HGB 11.4* 11.3*  HCT 34.2* 33.9*  PLT 472* 399   BMET Recent Labs    05/26/17 0631 05/27/17 0655  NA 136 136  K 3.7 3.8  CL 102 103  CO2 27 23  GLUCOSE 131* 161*  BUN 14 13  CREATININE 0.65 0.62  CALCIUM 8.1* 8.2*   PT/INR No results for input(s): LABPROT, INR in the last 72 hours. CMP     Component Value Date/Time   NA 136 05/27/2017 0655   K 3.8 05/27/2017 0655   CL 103 05/27/2017 0655   CO2 23 05/27/2017 0655   GLUCOSE 161 (H) 05/27/2017 0655   BUN 13 05/27/2017 0655   CREATININE 0.62 05/27/2017 0655   CALCIUM 8.2 (L) 05/27/2017 0655   PROT 5.0 (L) 05/27/2017  0655   ALBUMIN 1.7 (L) 05/27/2017 0655   AST 38 05/27/2017 0655   ALT 45 05/27/2017 0655   ALKPHOS 167 (H) 05/27/2017 0655   BILITOT 1.1 05/27/2017 0655   GFRNONAA >60 05/27/2017 0655   GFRAA >60 05/27/2017 0655   Lipase     Component Value Date/Time   LIPASE 40 05/24/2017 0606       Studies/Results: No results found.  Anti-infectives: Anti-infectives (From admission, onward)   Start     Dose/Rate Route Frequency Ordered Stop   05/22/17 1600  piperacillin-tazobactam (ZOSYN) IVPB 3.375 g  Status:  Discontinued     3.375 g 12.5 mL/hr over 240 Minutes Intravenous Every 8 hours 05/22/17 0816 05/24/17 1033   05/22/17 0830  piperacillin-tazobactam (ZOSYN) IVPB 3.375 g     3.375 g 100 mL/hr over 30 Minutes Intravenous  Once 05/22/17 0816 05/22/17 1138       Assessment/Plan HTN HLD Prediabetes  Acute pancreatitis, bile leak,s/p laparoscopic cholecystectomy 05/06/17 Dr. Donell BeersByerly -CT scan 3/20 showed persistent peripancreatic fat stranding about the head and body with small amount peripancreatic fluid suspicious for pancreatitis,focal rounded fluid proximal to cholecystectomy clips measuring 1.4 x 2.3 cm may be a cystic duct remnant or postoperative seroma,Small amount of free fluid in the gallbladder fossa, HIDA 3/20 + for leak -S/PERCP, 03/21, with sphincterotomy and common  bile duct stent placement; per GI plan stent removal and possible repeat ERCP to confirm resolution of bile leak around 4-6 weeks post placement -S/P IR perc drain, 03/22, cx pending NGTD, output decreasing  ID -zosyn 3/20>>3/22 VTE -SCDs FEN -IVF, carb modified Foley -none  Plan -Drain output decreasing but WBC is up. Discussed with IR, will repeat CT scan today.    LOS: 5 days    Franne Forts , [facility] 05/27/2017, 10:53 AM Pager: [phone] Consults: [phone] Mon-Fri 7:00 am-4:30 pm Sat-Sun 7:00 am-11:30 am

## 2017-05-27 NOTE — Progress Notes (Signed)
Daily Rounding Note  05/27/2017, 9:11 AM  LOS: 5 days   SUBJECTIVE:   Chief complaint: abdominal pain is much better.  1/10 level of pain isolated to epigastrium.  Surgery service has pt on Gabapentin, Naprosyn, Robaxin, Hydrocodone for pain.   Tolerating carb mod diet.  Formed stool yesterday.      Biliary drainage of 25 ml yesterday, 10 to 15 of which was the flush.    OBJECTIVE:         Vital signs in last 24 hours:    Temp:  [97.6 F (36.4 C)-98.4 F (36.9 C)] 98 F (36.7 C) (03/25 0544) Pulse Rate:  [76-83] 76 (03/25 0544) Resp:  [16-18] 17 (03/25 0544) BP: (121-132)/(60-74) 124/60 (03/25 0544) SpO2:  [98 %-100 %] 98 % (03/25 0544) Last BM Date: 05/21/17 Filed Weights   05/23/17 0610 05/23/17 1051 05/24/17 0512  Weight: 225 lb 15.5 oz (102.5 kg) 225 lb (102.1 kg) 226 lb 13.7 oz (102.9 kg)   General: looks well   Heart: RRR Chest: clear bil.   Abdomen: soft, ND, hypoactive BS.  Slight epigastric tenderness, no G/R.  No tenderness over perc drain site on right.  Drain bag empty.    Extremities: no CCE Neuro/Psych:  oriented  Intake/Output from previous day: 03/24 0701 - 03/25 0700 In: 500 [P.O.:480] Out: 25 [Drains:25]  Intake/Output this shift: No intake/output data recorded.  Lab Results: Recent Labs    05/26/17 0631 05/27/17 0655  WBC 11.2* 17.2*  HGB 11.4* 11.3*  HCT 34.2* 33.9*  PLT 472* 399   BMET Recent Labs    05/26/17 0631 05/27/17 0655  NA 136 136  K 3.7 3.8  CL 102 103  CO2 27 23  GLUCOSE 131* 161*  BUN 14 13  CREATININE 0.65 0.62  CALCIUM 8.1* 8.2*   LFT Recent Labs    05/26/17 0631 05/27/17 0655  PROT 5.0* 5.0*  ALBUMIN 1.7* 1.7*  AST 38 38  ALT 41 45  ALKPHOS 183* 167*  BILITOT 1.2 1.1   PT/INR No results for input(s): LABPROT, INR in the last 72 hours. Hepatitis Panel No results for input(s): HEPBSAG, HCVAB, HEPAIGM, HEPBIGM in the last 72  hours.  Studies/Results: No results found.   Scheduled Meds: . feeding supplement  237 mL Oral BID BM  . gabapentin  300 mg Oral TID  . lip balm  1 application Topical BID  . methocarbamol  750 mg Oral TID  . naproxen  500 mg Oral TID WC  . psyllium  1 packet Oral Daily  . saccharomyces boulardii  250 mg Oral BID  . sodium chloride flush  3 mL Intravenous Q12H  . sodium chloride flush  3 mL Intravenous Q12H   Continuous Infusions: . sodium chloride    . lactated ringers    . methocarbamol (ROBAXIN)  IV     PRN Meds:.sodium chloride, alum & mag hydroxide-simeth, bisacodyl, diphenhydrAMINE, guaiFENesin-dextromethorphan, HYDROcodone-acetaminophen, hydrocortisone, hydrocortisone cream, HYDROmorphone (DILAUDID) injection, lactated ringers, magic mouthwash, menthol-cetylpyridinium, methocarbamol (ROBAXIN)  IV, ondansetron (ZOFRAN) IV **OR** promethazine, phenol, polyethylene glycol, prochlorperazine, simethicone, sodium chloride flush, traMADol   ASSESMENT:   *  Biliary pancreatitis (lipase 3890).  Lap chole, somewhat messy case with friabilility and IOC not pursued 3/4.  Post op bile leak, readmission 3/19 (Lipase 124)  ERCP biliary stent placement 3/21.   IR placed perc biliary drain 3/22,  Rapidly drained 4.5 liters within first several hours, after that had little drainage. (lipase  40 on 3/22)   Drain output minimal since initial flush.  WBCs rising.  Fluid studies with moderate WBCs, no growth thus far at day 3 -4/5. Not on abx.    Clinically doing quite well, minor pain and tolerating solids.     PLAN   *  Need IR to study drain.  Does he need repeat CT?   *  Stopped Naprosyn, Robaxin and Gabapentin.  Will add tylenol prn. Has tramadol, hydrocodone and Dilaudid prn.      Jennye MoccasinSarah Riya Huxford  05/27/2017, 9:11 AM Pager: [phone]

## 2017-05-27 NOTE — Progress Notes (Signed)
Patient is requesting to take tylenol 1000 mg. Patient informed he has ultram and norco ordered. Per patient he does not want to take any narcotic. Text paged Dr Marilynne HalstedF Byerly.

## 2017-05-28 ENCOUNTER — Other Ambulatory Visit: Payer: Self-pay

## 2017-05-28 DIAGNOSIS — K839 Disease of biliary tract, unspecified: Secondary | ICD-10-CM

## 2017-05-28 NOTE — Progress Notes (Signed)
5 Days Post-Op    CC:  Abdominal pain, gas and diarrhea after cholecystectomy  Subjective: Minimal discomfort, tolerating diet and having BM's.  He lives alone,and is retired.   Tylenol, Naprosyn and tramadol for pain.   Objective: Vital signs in last 24 hours: Temp:  [97.8 F (36.6 C)-99.9 F (37.7 C)] 98.8 F (37.1 C) (03/26 0825) Pulse Rate:  [76-84] 77 (03/26 0825) Resp:  [18-24] 24 (03/26 0825) BP: (131-145)/(74-90) 145/90 (03/26 0825) SpO2:  [98 %-100 %] 99 % (03/26 0825) Last BM Date: 05/27/17 2040 PO Urine x 4 Drain 20 Afebrile, VSS Labs yesterday shows rise in WBC CT abd 2/25:  Status post cholecystectomy. Left hepatic pneumobilia is noted. Interval placement of common bile duct stent is noted. 3.4 x 2.3 cm fluid collection with air-fluid level is noted in gallbladder fossa which is increased compared to prior exam. Crescent-shaped fluid collection with enhancing walls is noted along inferior portion of right hepatic lobe which may represent abscess or biloma.   Intake/Output from previous day: 03/25 0701 - 03/26 0700 In: 2063 [P.O.:2040; I.V.:3] Out: 20 [Drains:20] Intake/Output this shift: Total I/O In: 360 [P.O.:360] Out: -   General appearance: alert, cooperative and no distress Resp: clear to auscultation bilaterally GI: soft, minimal tenderness, almost nothing in drain this AM.    Lab Results:  Recent Labs    05/26/17 0631 05/27/17 0655  WBC 11.2* 17.2*  HGB 11.4* 11.3*  HCT 34.2* 33.9*  PLT 472* 399    BMET Recent Labs    05/26/17 0631 05/27/17 0655  NA 136 136  K 3.7 3.8  CL 102 103  CO2 27 23  GLUCOSE 131* 161*  BUN 14 13  CREATININE 0.65 0.62  CALCIUM 8.1* 8.2*   PT/INR No results for input(s): LABPROT, INR in the last 72 hours.  Recent Labs  Lab 05/22/17 0716 05/23/17 0616 05/24/17 0606 05/26/17 0631 05/27/17 0655  AST 49* 36 28 38 38  ALT 46 36 28 41 45  ALKPHOS 256* 243* 220* 183* 167*  BILITOT 2.2* 2.4* 2.0* 1.2 1.1   PROT 5.8* 5.6* 5.6* 5.0* 5.0*  ALBUMIN 2.2* 2.1* 1.9* 1.7* 1.7*     Lipase     Component Value Date/Time   LIPASE 40 05/24/2017 0606     Medications: . lip balm  1 application Topical BID  . sodium chloride flush  3 mL Intravenous Q12H  . sodium chloride flush  3 mL Intravenous Q12H   . sodium chloride     Anti-infectives (From admission, onward)   Start     Dose/Rate Route Frequency Ordered Stop   05/22/17 1600  piperacillin-tazobactam (ZOSYN) IVPB 3.375 g  Status:  Discontinued     3.375 g 12.5 mL/hr over 240 Minutes Intravenous Every 8 hours 05/22/17 0816 05/24/17 1033   05/22/17 0830  piperacillin-tazobactam (ZOSYN) IVPB 3.375 g     3.375 g 100 mL/hr over 30 Minutes Intravenous  Once 05/22/17 0816 05/22/17 1138      Assessment/Plan HTN HLD Prediabetes  Acute pancreatitis, bile leak,s/p laparoscopic cholecystectomy 05/06/17 Dr. Donell Beers -CT scan 3/20 showed persistent peripancreatic fat stranding about the head and body with small amount peripancreatic fluid suspicious for pancreatitis,focal rounded fluid proximal to cholecystectomy clips measuring 1.4 x 2.3 cm may be a cystic duct remnant or postoperative seroma,Small amount of free fluid in the gallbladder fossa, HIDA 3/20 + for leak -S/PERCP, 03/21, with sphincterotomy and common bile duct stent placement; per GI plan stent removal and possible repeat ERCP  to confirm resolution of bile leak around 4-6 weeks post placement -S/P IR perc drain, 03/22, cx pending NGTD, output decreasing CT scan show new fluid collection - IR will replace or reposition the drain in AM.  ID -zosyn 3/20>>3/22 VTE -SCDs FEN -IVF,carb modified Foley -none  Plan:  CT reviewed and we have discussed with Dr. Fredia SorrowYamagata in Radiology.  They will reposition catheter tomorrow or place a new one.  I have shown pt the CT scan and explained to him the current plan.          LOS: 6 days    Ulis Kaps 05/28/2017 [phone]

## 2017-05-28 NOTE — Consult Note (Signed)
Chief Complaint: Patient was seen in consultation today for repositioning of existing intra abdominal drain catheter vs new placement Chief Complaint  Patient presents with  . Post-op Problem  . Abdominal Pain  . Diarrhea  . Fatigue   at the request of Dr Edythe Lynn   Supervising Physician: Irish Lack  Patient Status: [facility] - In-pt  History of Present Illness: Nathan Baxter is a 72 y.o. male   Acute pancreatitis and lap chole 05/06/17 Post op intra abdominal fluid collection  drain placed 3/22 in IR  OP from existing drain is minimal-- brown orange color--- + bile CT yesterday:IMPRESSION: Interval placement of drainage catheter into right lower quadrant fluid collection. This fluid collection appears to be completely resolved at this time. Status post cholecystectomy. 3.4 x 2.3 cm fluid collection with air-fluid level is noted in gallbladder fossa which is increased compared to prior exam and consistent with biloma. Interval placement of common bile duct stent is noted. Also noted is large crush in State fluid collection overlying lateral margin of inferior portion of right hepatic lobe with enhancing wall margins consistent with abscess or biloma. Stable fluid is noted anterior to pancreatic head and body suggesting pancreatitis. Stable low-density structure seen medial to right kidney which may represent hematoma. Minimal right pleural effusion is noted with adjacent subsegmental atelectasis.  Request for drain repositioning vs new drain per [facility] Dr Fredia Sorrow has reviewed imaging and approves procedure   Past Medical History:  Diagnosis Date  . Acute biliary pancreatitis   . Aneurysm of infrarenal abdominal aorta (HCC)   . Cholecystitis   . HLD (hyperlipidemia)   . HTN (hypertension)   . Prediabetes     Past Surgical History:  Procedure Laterality Date  . CHOLECYSTECTOMY N/A 05/06/2017   Procedure: LAPAROSCOPIC CHOLECYSTECTOMY;  Surgeon: Almond Lint, MD;   Location: [facility] ORS;  Service: General;  Laterality: N/A;  . ERCP N/A 05/23/2017   Procedure: ENDOSCOPIC RETROGRADE CHOLANGIOPANCREATOGRAPHY (ERCP);  Surgeon: Sherrilyn Rist, MD;  Location: [facility] ENDOSCOPY;  Service: Gastroenterology;  Laterality: N/A;    Allergies: Sulfa antibiotics  Medications: Prior to Admission medications   Medication Sig Start Date End Date Taking? Authorizing Provider  amLODipine (NORVASC) 10 MG tablet Take 1 tablet (10 mg total) by mouth daily. 05/09/17  Yes Rodolph Bong, MD  metoprolol tartrate (LOPRESSOR) 25 MG tablet Take 0.5 tablets (12.5 mg total) by mouth 2 (two) times daily. 05/08/17  Yes Rodolph Bong, MD  simethicone (MYLICON) 80 MG chewable tablet Chew 2 tablets (160 mg total) by mouth 4 (four) times daily as needed for flatulence. 05/08/17  Yes Rodolph Bong, MD  traMADol (ULTRAM) 50 MG tablet Take 1 tablet (50 mg total) by mouth every 6 (six) hours as needed for moderate pain. 05/08/17  Yes Rodolph Bong, MD     Family History  Problem Relation Age of Onset  . Breast cancer Mother   . Hypertension Father   . Hypertension Sister     Social History   Socioeconomic History  . Marital status: Single    Spouse name: Not on file  . Number of children: Not on file  . Years of education: Not on file  . Highest education level: Not on file  Occupational History  . Not on file  Social Needs  . Financial resource strain: Not on file  . Food insecurity:    Worry: Not on file    Inability: Not on file  . Transportation needs:  Medical: Not on file    Non-medical: Not on file  Tobacco Use  . Smoking status: Former Games developer  . Smokeless tobacco: Never Used  Substance and Sexual Activity  . Alcohol use: Yes    Frequency: Never  . Drug use: No  . Sexual activity: Not on file  Lifestyle  . Physical activity:    Days per week: Not on file    Minutes per session: Not on file  . Stress: Not on file  Relationships  . Social  connections:    Talks on phone: Not on file    Gets together: Not on file    Attends religious service: Not on file    Active member of club or organization: Not on file    Attends meetings of clubs or organizations: Not on file    Relationship status: Not on file  Other Topics Concern  . Not on file  Social History Narrative  . Not on file     Review of Systems: A 12 point ROS discussed and pertinent positives are indicated in the HPI above.  All other systems are negative.  Review of Systems  Constitutional: Positive for activity change and fatigue. Negative for fever.  Neurological: Negative for weakness.  Psychiatric/Behavioral: Negative for behavioral problems and confusion.    Vital Signs: BP (!) 145/90 (BP Location: Left Arm)   Pulse 77   Temp 98.8 F (37.1 C) (Oral)   Resp (!) 24   Ht 6' (1.829 m)   Wt 226 lb 13.7 oz (102.9 kg)   SpO2 99%   BMI 30.77 kg/m   Physical Exam  Constitutional: He is oriented to person, place, and time.  Cardiovascular: Normal rate and regular rhythm.  Pulmonary/Chest: Effort normal and breath sounds normal.  Abdominal: Normal appearance.  Neurological: He is alert and oriented to person, place, and time.  Skin: Skin is warm and dry.  Skin site is clean and dry Site no infection No bleeding  OP bile No growth  Psychiatric: He has a normal mood and affect. His behavior is normal.  Nursing note and vitals reviewed.   Imaging: Nm Hepatobiliary Liver Func  Result Date: 05/22/2017 CLINICAL DATA:  History of cholecystectomy 16 days ago, now with intra-abdominal fluid worrisome for a bile leak. EXAM: NUCLEAR MEDICINE HEPATOBILIARY IMAGING TECHNIQUE: Sequential images of the abdomen were obtained out to 60 minutes following intravenous administration of radiopharmaceutical. RADIOPHARMACEUTICALS:  5.1 mCi Tc-22m  Choletec IV COMPARISON:  CT abdomen pelvis-05/22/2017 FINDINGS: There is homogeneous distribution of injected radiotracer  throughout the hepatic parenchyma. There is early excretion of radiotracer with opacification of the central aspect of the biliary tree. There is pooling of radiotracer along the gallbladder fossa with eventual spillage into the right hemiabdomen extending to the level right hemiabdomen to the pelvis bilaterally. There is no definitive passage of radiotracer activity into the duodenum. IMPRESSION: Examination is positive for biliary leak as detailed above. Further evaluation with ERCP could be performed as indicated. These results will be called to the ordering clinician or representative by the Radiologist Assistant, and communication documented in the PACS or zVision Dashboard. Electronically Signed   By: Simonne Come M.D.   On: 05/22/2017 18:17   US Abdomen Complete  Result Date: 05/03/2017 CLINICAL DATA:  72 year old male with history of pancreatitis. EXAM: ABDOMEN ULTRASOUND COMPLETE COMPARISON:  No prior abdominal ultrasound. CT the abdomen and pelvis 05/02/2017. FINDINGS: Gallbladder: No gallstones or wall thickening visualized. No sonographic Murphy sign noted by sonographer. Common bile  duct: Diameter: 4.8 mm Liver: No focal lesion identified. Within normal limits in parenchymal echogenicity. Portal vein is patent on color Doppler imaging with normal direction of blood flow towards the liver. IVC: No abnormality visualized. Pancreas: Poorly visualized. Spleen: Size and appearance within normal limits. Right Kidney: Length: 12.1 cm. Echogenicity within normal limits. No mass or hydronephrosis visualized. Left Kidney: Length: 12.2 cm. Echogenicity within normal limits. No mass or hydronephrosis visualized. Abdominal aorta: Fusiform dilatation of the infrarenal abdominal aorta measuring up to 3.1 cm in diameter. Other findings: None. IMPRESSION: 1. Pancreas is poorly visualized on today's examination secondary to overlying bowel gas. 2. No acute findings. 3. Small infrarenal abdominal aortic aneurysm  measuring 3.1 cm in diameter. Recommend followup by ultrasound in 3 years. This recommendation follows ACR consensus guidelines: White Paper of the ACR Incidental Findings Committee II on Vascular Findings. J Am Coll Radiol 2013; 10:789-794. Electronically Signed   By: Trudie Reed M.D.   On: 05/03/2017 15:38   Ct Abdomen Pelvis W Contrast  Result Date: 05/27/2017 CLINICAL DATA:  Status post drainage catheter placement. EXAM: CT ABDOMEN AND PELVIS WITH CONTRAST TECHNIQUE: Multidetector CT imaging of the abdomen and pelvis was performed using the standard protocol following bolus administration of intravenous contrast. CONTRAST:  ISOVUE-300 IOPAMIDOL (ISOVUE-300) INJECTION 61% COMPARISON:  CT scan of May 22, 2017. FINDINGS: Lower chest: Minimal right pleural effusion is noted with adjacent subsegmental atelectasis. Hepatobiliary: Status post cholecystectomy. Left hepatic pneumobilia is noted. Interval placement of common bile duct stent is noted. 3.4 x 2.3 cm fluid collection with air-fluid level is noted in gallbladder fossa which is increased compared to prior exam. Crescent-shaped fluid collection with enhancing walls is noted along inferior portion of right hepatic lobe which may represent abscess or biloma. Pancreas: Stable fluid is seen anterior to the pancreatic head and body suggesting pancreatitis. No ductal dilatation is noted. Spleen: Normal in size without focal abnormality. Adrenals/Urinary Tract: Adrenal glands and kidneys appear normal. No hydronephrosis or renal obstruction is noted. Urinary bladder is unremarkable. No renal or ureteral calculi are noted. Stable 3.8 x 1.9 cm low density structure is seen medial to the right kidney which may represent hematoma. Stomach/Bowel: There is no evidence of bowel obstruction or inflammation. Stomach appears normal. Vascular/Lymphatic: Stable 3.2 cm infrarenal abdominal aortic aneurysm is noted. No significant adenopathy is noted. Reproductive:  Prostate is unremarkable. Other: Mild amount of fluid is noted in the pelvis. Interval placement of drainage catheter into right lower quadrant. No fluid is noted around the catheter tip. Musculoskeletal: No acute or significant osseous findings. IMPRESSION: Interval placement of drainage catheter into right lower quadrant fluid collection. This fluid collection appears to be completely resolved at this time. Status post cholecystectomy. 3.4 x 2.3 cm fluid collection with air-fluid level is noted in gallbladder fossa which is increased compared to prior exam and consistent with biloma. Interval placement of common bile duct stent is noted. Also noted is large crush in State fluid collection overlying lateral margin of inferior portion of right hepatic lobe with enhancing wall margins consistent with abscess or biloma. Stable fluid is noted anterior to pancreatic head and body suggesting pancreatitis. Stable low-density structure seen medial to right kidney which may represent hematoma. Minimal right pleural effusion is noted with adjacent subsegmental atelectasis. Stable 3.2 cm infrarenal abdominal aortic aneurysm. Recommend followup by ultrasound in 3 years. This recommendation follows ACR consensus guidelines: White Paper of the ACR Incidental Findings Committee II on Vascular Findings. J Am Coll Radiol  2013; 98:119-147. Electronically Signed   By: Lupita Raider, M.D.   On: 05/27/2017 15:58   Ct Abdomen Pelvis W Contrast  Result Date: 05/22/2017 CLINICAL DATA:  Abdominal pain and distension. Diarrhea. Cholecystectomy 16 days prior. EXAM: CT ABDOMEN AND PELVIS WITH CONTRAST TECHNIQUE: Multidetector CT imaging of the abdomen and pelvis was performed using the standard protocol following bolus administration of intravenous contrast. CONTRAST:  ISOVUE-300 IOPAMIDOL (ISOVUE-300) INJECTION 61% COMPARISON:  CT 05/02/2017.  MRI 05/04/2017 FINDINGS: Lower chest: Linear atelectasis in both lower lobes. Small  right pleural effusion. There are coronary artery calcifications. Hepatobiliary: Decreased hepatic density consistent with steatosis. No focal hepatic lesion. Interval cholecystectomy from prior imaging. Small amount of free fluid in the gallbladder fossa image 33 series 3. More focal rounded fluid proximal to cholecystectomy clips measuring 1.4 x 2.3 cm may be a cystic duct remnant or postoperative seroma. No internal air. No biliary dilatation. Pancreas: Peripancreatic fat stranding and free fluid about the head and body of the pancreas. No ductal dilatation. No organized peripancreatic collection. Spleen: Normal in size without focal abnormality. Adrenals/Urinary Tract: Normal adrenal glands. No hydronephrosis or perinephric edema. Homogeneous renal enhancement with symmetric excretion on delayed phase imaging. Medial to the right kidney is a 2.9 x 2.8 cm heterogeneous low-density structure that is new from prior exam. This appears separate from the right kidney and is adjacent to the renal artery. No surrounding fat stranding. Urinary bladder is non distended without wall thickening. Stomach/Bowel: Stomach physiologically distended. No gastric wall thickening. Presence of intra-abdominal ascites partially limits bowel evaluation. Allowing for this, no evidence of bowel wall thickening, obstruction or inflammatory change. Administered enteric contrast reaches distal small bowel. Appendix is normal. Vascular/Lymphatic: 3.3 cm infrarenal abdominal aortic aneurysm, unchanged. Moderate aortic atherosclerosis. Limited assessment for adenopathy given presence of ascites. Reproductive: Prostate is unremarkable. Other: Moderate abdominopelvic ascites is new from prior exam. Fluid measures simple fluid density. No free air. No evidence of intra-abdominal abscess. Small fat containing umbilical hernia. Musculoskeletal: Stable degenerative change in the lumbar spine with endplate irregularity at L3-L4. There are no acute or  suspicious osseous abnormalities. IMPRESSION: 1. Moderate volume abdominopelvic ascites, new from prior exam. 2. Persistent peripancreatic fat stranding about the head and body, with small amount peripancreatic fluid, suspicious for pancreatitis. No focal fluid collection. 3. Small fluid density structure adjacent to surgical clips in the gallbladder fossa may be a cystic duct remnant or postoperative seroma. No surrounding inflammation or internal air to suggest abscess. 4. A 2.9 x 2.8 cm slightly heterogeneous structure medial to the right kidney is new from prior exam, suggesting hematoma. No active bleeding. 5. Chronic findings include hepatic steatosis and infrarenal abdominal aortic aneurysm with maximal dimension 3.3 cm. Electronically Signed   By: Rubye Oaks M.D.   On: 05/22/2017 04:05   Ct Abdomen Pelvis W Contrast  Result Date: 05/02/2017 CLINICAL DATA:  Acute generalized abdominal pain. EXAM: CT ABDOMEN AND PELVIS WITH CONTRAST TECHNIQUE: Multidetector CT imaging of the abdomen and pelvis was performed using the standard protocol following bolus administration of intravenous contrast. CONTRAST:  ISOVUE-300 IOPAMIDOL (ISOVUE-300) INJECTION 61% COMPARISON:  None. FINDINGS: Lower chest: No acute abnormality. Hepatobiliary: No focal liver abnormality is seen. No gallstones, gallbladder wall thickening, or biliary dilatation. Pancreas: Unremarkable. No pancreatic ductal dilatation or surrounding inflammatory changes. Spleen: Normal in size without focal abnormality. Adrenals/Urinary Tract: Adrenal glands are unremarkable. Kidneys are normal, without renal calculi, focal lesion, or hydronephrosis. Bladder is unremarkable. Stomach/Bowel: The appendix appears  normal. There is no evidence of bowel obstruction. Focal wall thickening and inflammatory changes are seen involving the posterior wall of the gastric body, which may represent peptic ulcer disease. Small amount of fluid is also seen  posterior to second portion of duodenum. Vascular/Lymphatic: 3.3 cm infrarenal abdominal aortic aneurysm is noted. Atherosclerosis of abdominal aorta is noted. No adenopathy is noted. Reproductive: Prostate is unremarkable. Other: Small fat containing periumbilical hernia is noted. Musculoskeletal: No acute or significant osseous findings. IMPRESSION: Focal wall thickening and other inflammatory changes are seen along the posterior wall of gastric body concerning for possible peptic ulcer disease. Small amount of fluid is seen posterior to the second portion of duodenum which may be related to peptic ulcer disease is well. 3.3 cm infrarenal abdominal aortic aneurysm. Recommend followup by ultrasound in 3 years. This recommendation follows ACR consensus guidelines: White Paper of the ACR Incidental Findings Committee II on Vascular Findings. J Am Coll Radiol 2013; 10:789-794. Aortic Atherosclerosis (ICD10-I70.0). Electronically Signed   By: Lupita Raider, M.D.   On: 05/02/2017 13:20   Mr Abdomen Mrcp Wo Contrast  Result Date: 05/04/2017 CLINICAL DATA:  72 year old male with history of abnormal liver function tests. EXAM: MRI ABDOMEN WITHOUT CONTRAST  (INCLUDING MRCP) TECHNIQUE: Multiplanar multisequence MR imaging of the abdomen was performed. Heavily T2-weighted images of the biliary and pancreatic ducts were obtained, and three-dimensional MRCP images were rendered by post processing. COMPARISON:  No prior abdominal MRI. CT the abdomen and pelvis 05/02/2017. FINDINGS: Lower chest: Moderate right pleural effusion lying dependently. Increased signal intensity in portions of the right lower lobe dependently. Hepatobiliary: Diffuse loss of signal intensity throughout the hepatic parenchyma, indicative of hepatic steatosis. No suspicious cystic or solid hepatic lesions noted on today's noncontrast examination. No intra or extrahepatic biliary ductal dilatation on MRCP images. Common bile duct measures 6 mm in the  porta hepatis. No filling defects along the common bile duct to suggest choledocholithiasis. Several tiny filling defects within the gallbladder, compatible with small gallstones. No surrounding inflammatory changes to suggest an acute cholecystitis at this time. Pancreas: No definite pancreatic mass appreciated on today's noncontrast examination. Increased T2 signal intensity adjacent to the head and proximal body of the pancreas, concerning for an acute pancreatitis. No pancreatic ductal dilatation noted on MRCP images. No well-defined peripancreatic fluid collections are noted. Spleen:  Unremarkable. Adrenals/Urinary Tract: Unenhanced appearance of the kidneys and bilateral adrenal glands is unremarkable. Stomach/Bowel: No hydroureteronephrosis in the visualized portions of the abdomen. Vascular/Lymphatic: Fusiform aneurysmal dilatation of the infrarenal abdominal aorta measuring 2.9 x 3.5 cm. No lymphadenopathy noted in the abdomen. Other: Increased fluid signal intensity throughout the right side of the retroperitoneum, presumably related to acute pancreatitis. Trace volume of ascites in the right side of the abdomen. Musculoskeletal: No aggressive appearing osseous lesions are noted in the visualized portions of the skeleton. IMPRESSION: 1. No signs of biliary tract obstruction. 2. Cholelithiasis without evidence of acute cholecystitis at this time. 3. Inflammatory changes adjacent to the head and proximal body of the pancreas concerning for an acute pancreatitis. 4. Moderate right pleural effusion lying dependently with some associated passive subsegmental atelectasis in the right lower lobe. 5. Hepatic steatosis. Electronically Signed   By: Trudie Reed M.D.   On: 05/04/2017 12:19   Mr 3d Recon At Scanner  Result Date: 05/04/2017 CLINICAL DATA:  72 year old male with history of abnormal liver function tests. EXAM: MRI ABDOMEN WITHOUT CONTRAST  (INCLUDING MRCP) TECHNIQUE: Multiplanar multisequence MR  imaging of the  abdomen was performed. Heavily T2-weighted images of the biliary and pancreatic ducts were obtained, and three-dimensional MRCP images were rendered by post processing. COMPARISON:  No prior abdominal MRI. CT the abdomen and pelvis 05/02/2017. FINDINGS: Lower chest: Moderate right pleural effusion lying dependently. Increased signal intensity in portions of the right lower lobe dependently. Hepatobiliary: Diffuse loss of signal intensity throughout the hepatic parenchyma, indicative of hepatic steatosis. No suspicious cystic or solid hepatic lesions noted on today's noncontrast examination. No intra or extrahepatic biliary ductal dilatation on MRCP images. Common bile duct measures 6 mm in the porta hepatis. No filling defects along the common bile duct to suggest choledocholithiasis. Several tiny filling defects within the gallbladder, compatible with small gallstones. No surrounding inflammatory changes to suggest an acute cholecystitis at this time. Pancreas: No definite pancreatic mass appreciated on today's noncontrast examination. Increased T2 signal intensity adjacent to the head and proximal body of the pancreas, concerning for an acute pancreatitis. No pancreatic ductal dilatation noted on MRCP images. No well-defined peripancreatic fluid collections are noted. Spleen:  Unremarkable. Adrenals/Urinary Tract: Unenhanced appearance of the kidneys and bilateral adrenal glands is unremarkable. Stomach/Bowel: No hydroureteronephrosis in the visualized portions of the abdomen. Vascular/Lymphatic: Fusiform aneurysmal dilatation of the infrarenal abdominal aorta measuring 2.9 x 3.5 cm. No lymphadenopathy noted in the abdomen. Other: Increased fluid signal intensity throughout the right side of the retroperitoneum, presumably related to acute pancreatitis. Trace volume of ascites in the right side of the abdomen. Musculoskeletal: No aggressive appearing osseous lesions are noted in the visualized  portions of the skeleton. IMPRESSION: 1. No signs of biliary tract obstruction. 2. Cholelithiasis without evidence of acute cholecystitis at this time. 3. Inflammatory changes adjacent to the head and proximal body of the pancreas concerning for an acute pancreatitis. 4. Moderate right pleural effusion lying dependently with some associated passive subsegmental atelectasis in the right lower lobe. 5. Hepatic steatosis. Electronically Signed   By: Trudie Reedaniel  Entrikin M.D.   On: 05/04/2017 12:19   Dg Ercp Biliary & Pancreatic Ducts  Result Date: 05/23/2017 CLINICAL DATA:  72 year old male with bile leak EXAM: ERCP TECHNIQUE: Multiple spot images obtained with the fluoroscopic device and submitted for interpretation post-procedure. FLUOROSCOPY TIME:  Fluoroscopy Time:  4 minutes 37 seconds COMPARISON:  CT of the abdomen and pelvis 05/22/2017; nuclear medicine HIDA scan 05/22/2017 FINDINGS: A total of 12 intraoperative spot images are submitted for review. The images demonstrate a flexible endoscope in the descending duodenum with deep wire cannulation of the hepatic ducts. Subsequent cholangiogram demonstrates opacification of the biliary tree. Contrast extravasation is noted in the region of the cystic duct. On the final images, a plastic biliary stent has been placed. IMPRESSION: 1. Bile leak from the region of the cystic duct remnant. 2. Placement of a plastic biliary stent. These images were submitted for radiologic interpretation only. Please see the procedural report for the amount of contrast and the fluoroscopy time utilized. Electronically Signed   By: Malachy MoanHeath  McCullough M.D.   On: 05/23/2017 16:00   Ct Image Guided Drainage By Percutaneous Catheter  Result Date: 05/24/2017 INDICATION: 72 year old with history of cholecystectomy and bile leak. Patient has a large amount of intra-abdominal fluid. Recently placed endoscopic biliary stent. EXAM: CT-GUIDED PLACEMENT OF DRAINAGE CATHETER WITHIN THE RIGHT  ABDOMINAL FLUID COLLECTION MEDICATIONS: Sedation medications ANESTHESIA/SEDATION: 2.0 mg IV Versed 100 mcg IV Fentanyl Moderate Sedation Time:  24 minutes The patient was continuously monitored during the procedure by the interventional radiology nurse under my direct supervision. COMPLICATIONS:  None immediate. TECHNIQUE: Informed written consent was obtained from the patient after a thorough discussion of the procedural risks, benefits and alternatives. All questions were addressed. A timeout was performed prior to the initiation of the procedure. PROCEDURE: Patient was placed supine on CT scanner. Images through the abdomen were obtained. The right side of the abdomen was prepped with chlorhexidine and sterile field was created. Skin and soft tissues were anesthetized with 1% lidocaine. 18 gauge trocar needle was directed into the right abdominal fluid collection with CT guidance. Orange/brown colored fluid was aspirated. Stiff Amplatz wire was placed. The tract was dilated to accommodate a 12 [place] drain. Catheter was attached to a gravity bag and greater than 500 mL of fluid was within the gravity bag by the end of the procedure. Catheter was sutured to skin. FINDINGS: Large amount of abdominal fluid, particularly around the liver and just inferior to the right hepatic lobe. Orange/brown fluid was removed from the abdominal cavity. Findings compatible with history of a bile leak. Sample of the fluid was sent for culture. IMPRESSION: CT-guided placement of a drainage catheter within the right abdominal fluid collection. Electronically Signed   By: Richarda Overlie M.D.   On: 05/24/2017 18:47    Labs:  CBC: Recent Labs    05/23/17 0616 05/24/17 0606 05/26/17 0631 05/27/17 0655  WBC 10.1 11.3* 11.2* 17.2*  HGB 12.5* 11.3* 11.4* 11.3*  HCT 37.2* 33.9* 34.2* 33.9*  PLT 470* 432* 472* 399    COAGS: Recent Labs    05/22/17 1531  INR 1.14    BMP: Recent Labs    05/23/17 0616 05/24/17 0606  05/26/17 0631 05/27/17 0655  NA 132* 134* 136 136  K 4.2 4.2 3.7 3.8  CL 100* 102 102 103  CO2 21* 21* 27 23  GLUCOSE 148* 145* 131* 161*  BUN 18 16 14 13   CALCIUM 8.2* 8.6* 8.1* 8.2*  CREATININE 1.06 0.89 0.65 0.62  GFRNONAA >60 >60 >60 >60  GFRAA >60 >60 >60 >60    LIVER FUNCTION TESTS: Recent Labs    05/23/17 0616 05/24/17 0606 05/26/17 0631 05/27/17 0655  BILITOT 2.4* 2.0* 1.2 1.1  AST 36 28 38 38  ALT 36 28 41 45  ALKPHOS 243* 220* 183* 167*  PROT 5.6* 5.6* 5.0* 5.0*  ALBUMIN 2.1* 1.9* 1.7* 1.7*    TUMOR MARKERS: No results for input(s): AFPTM, CEA, CA199, CHROMGRNA in the last 8760 hours.  Assessment and Plan:  Intra abdominal fluid collection post op Drain placed in IR 3/22 OP minimal New CT findings show resolution of collection at drain site-- new collection at inferior rt hepatic area Now scheduled for reposition of existing drain vs new drain placement Risks and benefits discussed with the patient including bleeding, infection, damage to adjacent structures, bowel perforation/fistula connection, and sepsis.  All of the patient's questions were answered, patient is agreeable to proceed. Consent signed and in chart.   Thank you for this interesting consult.  I greatly enjoyed meeting Bland Rudzinski and look forward to participating in their care.  A copy of this report was sent to the requesting provider on this date.  Electronically Signed: Robet Leu, PA-C 05/28/2017, 11:47 AM   I spent a total of 40 Minutes    in face to face in clinical consultation, greater than 50% of which was counseling/coordinating care for intra abdominal collection drain

## 2017-05-29 ENCOUNTER — Encounter (HOSPITAL_COMMUNITY): Payer: Self-pay | Admitting: Interventional Radiology

## 2017-05-29 ENCOUNTER — Inpatient Hospital Stay (HOSPITAL_COMMUNITY): Payer: Medicare HMO

## 2017-05-29 HISTORY — PX: IR CATHETER TUBE CHANGE: IMG717

## 2017-05-29 LAB — COMPREHENSIVE METABOLIC PANEL
ALBUMIN: 1.8 g/dL — AB (ref 3.5–5.0)
ALK PHOS: 170 U/L — AB (ref 38–126)
ALT: 44 U/L (ref 17–63)
ANION GAP: 10 (ref 5–15)
AST: 37 U/L (ref 15–41)
BUN: 9 mg/dL (ref 6–20)
CALCIUM: 8.5 mg/dL — AB (ref 8.9–10.3)
CO2: 26 mmol/L (ref 22–32)
Chloride: 98 mmol/L — ABNORMAL LOW (ref 101–111)
Creatinine, Ser: 0.67 mg/dL (ref 0.61–1.24)
GFR calc non Af Amer: >60 mL/min (ref >60)
GLUCOSE: 134 mg/dL — AB (ref 65–99)
POTASSIUM: 4.5 mmol/L (ref 3.5–5.1)
SODIUM: 134 mmol/L — AB (ref 135–145)
TOTAL PROTEIN: 5.6 g/dL — AB (ref 6.5–8.1)
Total Bilirubin: 0.8 mg/dL (ref 0.3–1.2)

## 2017-05-29 LAB — CBC
HCT: 36.4 % — ABNORMAL LOW (ref 39.0–52.0)
Hemoglobin: 11.9 g/dL — ABNORMAL LOW (ref 13.0–17.0)
MCH: 27.7 pg (ref 26.0–34.0)
MCHC: 32.7 g/dL (ref 30.0–36.0)
MCV: 84.8 fL (ref 78.0–100.0)
Platelets: 574 10*3/uL — ABNORMAL HIGH (ref 150–400)
RBC: 4.29 MIL/uL (ref 4.22–5.81)
RDW: 13.5 % (ref 11.5–15.5)
WBC: 12.2 10*3/uL — ABNORMAL HIGH (ref 4.0–10.5)

## 2017-05-29 LAB — AEROBIC/ANAEROBIC CULTURE (SURGICAL/DEEP WOUND)

## 2017-05-29 LAB — AEROBIC/ANAEROBIC CULTURE W GRAM STAIN (SURGICAL/DEEP WOUND): Culture: NO GROWTH

## 2017-05-29 MED ORDER — LIDOCAINE HCL (PF) 1 % IJ SOLN
INTRAMUSCULAR | Status: AC | PRN
  Administered 2017-05-29: 10 mL

## 2017-05-29 MED ORDER — LIDOCAINE HCL 1 % IJ SOLN
INTRAMUSCULAR | Status: AC
  Filled 2017-05-29: qty 20

## 2017-05-29 MED ORDER — IOPAMIDOL (ISOVUE-300) INJECTION 61%
INTRAVENOUS | Status: AC
  Administered 2017-05-29: 10 mL
  Filled 2017-05-29: qty 50

## 2017-05-29 NOTE — Progress Notes (Signed)
[facility]/Trauma Progress Note  6 Days Post-Op   Assessment/Plan HTN HLD Prediabetes  Acute pancreatitis, bile leak,s/p laparoscopic cholecystectomy 05/06/17 Dr. Donell Beers -CT scan3/20showedpersistent peripancreatic fat stranding about the head and body with small amount peripancreatic fluid suspicious for pancreatitis,focal rounded fluid proximal to cholecystectomy clips measuring 1.4 x 2.3 cm may be a cystic duct remnant or postoperative seroma,Small amount of free fluid in the gallbladder fossa, HIDA 3/20+ for leak -S/PERCP, 03/21, with sphincterotomy and common bile duct stent placement; per GI plan stent removal and possible repeat ERCP to confirm resolution of bile leak around 4-6 weeks post placement -S/PIR percdrain, 03/22,cx pending NGTD, output decreasing CT scan show new fluid collection - IR will replace or reposition the drain 03/27.  ID -zosyn 3/20>>3/22    WBC 12.2 03/27 VTE -SCDs FEN -IVF,carb modified Follow up: TBD Foley -none  Plan:  CT reviewed and we have discussed with Dr. Fredia Sorrow in Radiology.  They will reposition catheter today or place a new one. Appreciate IR's assistance with this patient.     LOS: 7 days    Subjective: CC: RUQ pain  Pain is improved and pain medicine is helping. No nausea, vomiting, fever, chills. No new complaints.   Objective: Vital signs in last 24 hours: Temp:  [97.2 F (36.2 C)-98 F (36.7 C)] 97.8 F (36.6 C) (03/27 0602) Pulse Rate:  [71-84] 84 (03/27 0602) Resp:  [18-22] 18 (03/27 0602) BP: (142-154)/(75-87) 142/75 (03/27 0602) SpO2:  [98 %-100 %] 100 % (03/27 0602) Last BM Date: 05/27/17  Intake/Output from previous day: 03/26 0701 - 03/27 0700 In: 970 [P.O.:960] Out: -  Intake/Output this shift: No intake/output data recorded.  PE: Gen: Alert, NAD, pleasant, cooperative Card: RRR Pulm: CTA, no W/R/R, rate and effort normal Abd: Soft,obese, no distention,+BS, drain  site C/D/I, drain with scant serous drainage, mild TTP in RUQ, no guarding or peritonitis. Skin: no rashes noted, warm and dry  Anti-infectives: Anti-infectives (From admission, onward)   Start     Dose/Rate Route Frequency Ordered Stop   05/22/17 1600  piperacillin-tazobactam (ZOSYN) IVPB 3.375 g  Status:  Discontinued     3.375 g 12.5 mL/hr over 240 Minutes Intravenous Every 8 hours 05/22/17 0816 05/24/17 1033   05/22/17 0830  piperacillin-tazobactam (ZOSYN) IVPB 3.375 g     3.375 g 100 mL/hr over 30 Minutes Intravenous  Once 05/22/17 0816 05/22/17 1138      Lab Results:  Recent Labs    05/27/17 0655 05/29/17 0452  WBC 17.2* 12.2*  HGB 11.3* 11.9*  HCT 33.9* 36.4*  PLT 399 574*   BMET Recent Labs    05/27/17 0655 05/29/17 0452  NA 136 134*  K 3.8 4.5  CL 103 98*  CO2 23 26  GLUCOSE 161* 134*  BUN 13 9  CREATININE 0.62 0.67  CALCIUM 8.2* 8.5*   PT/INR No results for input(s): LABPROT, INR in the last 72 hours. CMP     Component Value Date/Time   NA 134 (L) 05/29/2017 0452   K 4.5 05/29/2017 0452   CL 98 (L) 05/29/2017 0452   CO2 26 05/29/2017 0452   GLUCOSE 134 (H) 05/29/2017 0452   BUN 9 05/29/2017 0452   CREATININE 0.67 05/29/2017 0452   CALCIUM 8.5 (L) 05/29/2017 0452   PROT 5.6 (L) 05/29/2017 0452   ALBUMIN 1.8 (L) 05/29/2017 0452   AST 37 05/29/2017 0452   ALT 44 05/29/2017 0452   ALKPHOS 170 (H) 05/29/2017 0452   BILITOT 0.8 05/29/2017  [id number]   GFRNONAA >60 05/29/2017 0452   GFRAA >60 05/29/2017 0452   Lipase     Component Value Date/Time   LIPASE 40 05/24/2017 0606    Studies/Results: Ct Abdomen Pelvis W Contrast  Result Date: 05/27/2017 CLINICAL DATA:  Status post drainage catheter placement. EXAM: CT ABDOMEN AND PELVIS WITH CONTRAST TECHNIQUE: Multidetector CT imaging of the abdomen and pelvis was performed using the standard protocol following bolus administration of intravenous contrast. CONTRAST:  100mL ISOVUE-300 IOPAMIDOL  (ISOVUE-300) INJECTION 61% COMPARISON:  CT scan of May 22, 2017. FINDINGS: Lower chest: Minimal right pleural effusion is noted with adjacent subsegmental atelectasis. Hepatobiliary: Status post cholecystectomy. Left hepatic pneumobilia is noted. Interval placement of common bile duct stent is noted. 3.4 x 2.3 cm fluid collection with air-fluid level is noted in gallbladder fossa which is increased compared to prior exam. Crescent-shaped fluid collection with enhancing walls is noted along inferior portion of right hepatic lobe which may represent abscess or biloma. Pancreas: Stable fluid is seen anterior to the pancreatic head and body suggesting pancreatitis. No ductal dilatation is noted. Spleen: Normal in size without focal abnormality. Adrenals/Urinary Tract: Adrenal glands and kidneys appear normal. No hydronephrosis or renal obstruction is noted. Urinary bladder is unremarkable. No renal or ureteral calculi are noted. Stable 3.8 x 1.9 cm low density structure is seen medial to the right kidney which may represent hematoma. Stomach/Bowel: There is no evidence of bowel obstruction or inflammation. Stomach appears normal. Vascular/Lymphatic: Stable 3.2 cm infrarenal abdominal aortic aneurysm is noted. No significant adenopathy is noted. Reproductive: Prostate is unremarkable. Other: Mild amount of fluid is noted in the pelvis. Interval placement of drainage catheter into right lower quadrant. No fluid is noted around the catheter tip. Musculoskeletal: No acute or significant osseous findings. IMPRESSION: Interval placement of drainage catheter into right lower quadrant fluid collection. This fluid collection appears to be completely resolved at this time. Status post cholecystectomy. 3.4 x 2.3 cm fluid collection with air-fluid level is noted in gallbladder fossa which is increased compared to prior exam and consistent with biloma. Interval placement of common bile duct stent is noted. Also noted is large  crush in State fluid collection overlying lateral margin of inferior portion of right hepatic lobe with enhancing wall margins consistent with abscess or biloma. Stable fluid is noted anterior to pancreatic head and body suggesting pancreatitis. Stable low-density structure seen medial to right kidney which may represent hematoma. Minimal right pleural effusion is noted with adjacent subsegmental atelectasis. Stable 3.2 cm infrarenal abdominal aortic aneurysm. Recommend followup by ultrasound in 3 years. This recommendation follows ACR consensus guidelines: White Paper of the ACR Incidental Findings Committee II on Vascular Findings. J Am Coll Radiol 2013; 10:789-794. Electronically Signed   By: Lupita RaiderJames  Green Jr, M.D.   On: 05/27/2017 15:58      Jerre SimonJessica L Focht , [facility] 05/29/2017, 8:27 AM  Pager: [phone] Mon-Wed, Friday 7:00am-4:30pm Thurs 7am-11:30am  Consults: [phone]

## 2017-05-29 NOTE — Procedures (Signed)
R 12 Fr drain exchange Pus EBL 0 Comp 0

## 2017-05-30 ENCOUNTER — Ambulatory Visit: Payer: Medicare HMO | Admitting: Nurse Practitioner

## 2017-05-30 LAB — COMPREHENSIVE METABOLIC PANEL
ALBUMIN: 1.8 g/dL — AB (ref 3.5–5.0)
ALT: 36 U/L (ref 17–63)
ANION GAP: 9 (ref 5–15)
AST: 29 U/L (ref 15–41)
Alkaline Phosphatase: 148 U/L — ABNORMAL HIGH (ref 38–126)
BUN: 11 mg/dL (ref 6–20)
CHLORIDE: 100 mmol/L — AB (ref 101–111)
CO2: 26 mmol/L (ref 22–32)
Calcium: 8.2 mg/dL — ABNORMAL LOW (ref 8.9–10.3)
Creatinine, Ser: 0.72 mg/dL (ref 0.61–1.24)
GFR calc non Af Amer: >60 mL/min (ref >60)
GLUCOSE: 131 mg/dL — AB (ref 65–99)
Potassium: 4 mmol/L (ref 3.5–5.1)
Sodium: 135 mmol/L (ref 135–145)
Total Bilirubin: 0.8 mg/dL (ref 0.3–1.2)
Total Protein: 5.6 g/dL — ABNORMAL LOW (ref 6.5–8.1)

## 2017-05-30 LAB — CBC
HCT: 37.2 % — ABNORMAL LOW (ref 39.0–52.0)
Hemoglobin: 12 g/dL — ABNORMAL LOW (ref 13.0–17.0)
MCH: 27.6 pg (ref 26.0–34.0)
MCHC: 32.3 g/dL (ref 30.0–36.0)
MCV: 85.5 fL (ref 78.0–100.0)
PLATELETS: 649 10*3/uL — AB (ref 150–400)
RBC: 4.35 MIL/uL (ref 4.22–5.81)
RDW: 13.9 % (ref 11.5–15.5)
WBC: 7 10*3/uL (ref 4.0–10.5)

## 2017-05-30 MED ORDER — AMOXICILLIN-POT CLAVULANATE 875-125 MG PO TABS: 1.0000 | ORAL_TABLET | Freq: Two times a day (BID) | ORAL | 0 refills | Status: DC

## 2017-05-30 MED ORDER — TRAMADOL HCL 50 MG PO TABS: 50.0000 mg | ORAL_TABLET | Freq: Four times a day (QID) | ORAL | 0 refills | Status: DC | PRN

## 2017-05-30 NOTE — Telephone Encounter (Signed)
Pt has been scheduled for ERCP on Dr Myrtie Neitheranis' hospital day on May 6 th at 730. Case has been scheduled. Pt will need information at his office visit follow up with P.Guenther, NP.

## 2017-05-30 NOTE — Discharge Instructions (Signed)
Surgical Drain Home Care °Surgical drains are used to remove extra fluid that normally builds up in a surgical wound after surgery. A surgical drain helps to heal a surgical wound. Different kinds of surgical drains include: °· Active drains. These drains use suction to pull drainage away from the surgical wound. Drainage flows through a tube to a container outside of the body. It is important to keep the bulb or the drainage container flat (compressed) at all times, except while you empty it. Flattening the bulb or container creates suction. The two most common types of active drains are bulb drains and Hemovac drains. °· Passive drains. These drains allow fluid to drain naturally, by gravity. Drainage flows through a tube to a bandage (dressing) or a container outside of the body. Passive drains do not need to be emptied. The most common type of passive drain is the Penrose drain. ° °A drain is placed during surgery. Immediately after surgery, drainage is usually bright red and a little thicker than water. The drainage may gradually turn yellow or pink and become thinner. It is likely that your health care provider will remove the drain when the drainage stops or when the amount decreases to 1-2 Tbsp (15-30 mL) during a 24-hour period. °How to care for your surgical drain °· Keep the skin around the drain dry and covered with a dressing at all times. °· Check your drain area every day for signs of infection. Check for: °? More redness, swelling, or pain. °? Pus or a bad smell. °? Cloudy drainage. °Follow instructions from your health care provider about how to take care of your drain and how to change your dressing. Change your dressing at least one time every day. Change it more often if needed to keep the dressing dry. Make sure you: °1. Gather your supplies, including: °? Tape. °? Germ-free cleaning solution (sterile saline). °? Split gauze drain sponge: 4 x 4 inches (10 x 10 cm). °? Gauze square: 4 x 4 inches  (10 x 10 cm). °2. Wash your hands with soap and water before you change your dressing. If soap and water are not available, use hand sanitizer. °3. Remove the old dressing. Avoid using scissors to do that. °4. Use sterile saline to clean your skin around the drain. °5. Place the tube through the slit in a drain sponge. Place the drain sponge so that it covers your wound. °6. Place the gauze square or another drain sponge on top of the drain sponge that is on the wound. Make sure the tube is between those layers. °7. Tape the dressing to your skin. °8. If you have an active bulb or Hemovac drain, tape the drainage tube to your skin 1-2 inches (2.5-5 cm) below the place where the tube enters your body. Taping keeps the tube from pulling on any stitches (sutures) that you have. °9. Wash your hands with soap and water. °10. Write down the color of your drainage and how often you change your dressing. ° °How to empty your active bulb or Hemovac drain °1. Make sure that you have a measuring cup that you can empty your drainage into. °2. Wash your hands with soap and water. If soap and water are not available, use hand sanitizer. °3. Gently move your fingers down the tube while squeezing very lightly. This is called stripping the tube. This clears any drainage, clots, or tissue from the tube. °? Do not pull on the tube. °? You may need to strip   the tube several times every day to keep the tube clear. °4. Open the bulb cap or the drain plug. Do not touch the inside of the cap or the bottom of the plug. °5. Empty all of the drainage into the measuring cup. °6. Compress the bulb or the container and replace the cap or the plug. To compress the bulb or the container, squeeze it firmly in the middle while you close the cap or plug the container. °7. Write down the amount of drainage that you have in each 24-hour period. If you have less than 2 Tbsp (30 mL) of drainage during 24 hours, contact your health care  provider. °8. Flush the drainage down the toilet. °9. Wash your hands with soap and water. °Contact a health care provider if: °· You have more redness, swelling, or pain around your drain area. °· The amount of drainage that you have is increasing instead of decreasing. °· You have pus or a bad smell coming from your drain area. °· You have a fever. °· You have drainage that is cloudy. °· There is a sudden stop or a sudden decrease in the amount of drainage that you have. °· Your tube falls out. °· Your active drain does not stay compressed after you empty it. °This information is not intended to replace advice given to you by your health care provider. Make sure you discuss any questions you have with your health care provider. °Document Released: 02/17/2000 Document Revised: 07/28/2015 Document Reviewed: 09/08/2014 °Elsevier Interactive Patient Education © 2018 [employer] ° °

## 2017-05-30 NOTE — Discharge Summary (Signed)
[facility]/Trauma Discharge Summary   Patient ID: Nathan Baxter MRN: [id number] DOB/AGE: 03-10-1945 72 y.o.  Admit date: 05/21/2017 Discharge date: 05/30/2017  Admitting Diagnosis: Abdominal pain Pancreatitis Bile leak s/p cholecystectomy Intraabdominal fluid collection  Discharge Diagnosis Patient Active Problem List   Diagnosis Date Noted  . Protein-calorie malnutrition, severe ([place]) 05/26/2017  . Postoperative leak of cystic duct s/p ERCP/stent/bilairy drain 05/24/2017 05/26/2017  . Acute pancreatitis 05/22/2017  . Acute kidney injury ([place]) 05/22/2017  . Watery diarrhea 05/22/2017  . HLD (hyperlipidemia) 05/22/2017  . HTN (hypertension) 05/22/2017  . S/P cholecystectomy 05/22/2017  . Abnormal urinalysis 05/22/2017  . Renal hematoma/right 05/22/2017  . Aneurysm of infrarenal abdominal aorta ([facility]) 05/22/2017  . Acute gangrenous cholecystitis s/p lap cholecystectomy 05/06/2017 05/08/2017  . Hypokalemia 05/08/2017  . Leukocytosis 05/03/2017  . Hypertensive urgency 05/03/2017  . Abdominal aortic aneurysm (AAA) ([place]) 05/03/2017  . Hyperglycemia 05/03/2017    Consultants Gastroenterology  Interventional radiology  Imaging: Ir Catheter Tube Change  Result Date: 05/29/2017 INDICATION: Reposition abscess drain.  Bile leak. EXAM: DRAIN EXCHANGE MEDICATIONS: The patient is currently admitted to the hospital and receiving intravenous antibiotics. The antibiotics were administered within an appropriate time frame prior to the initiation of the procedure. ANESTHESIA/SEDATION: None. COMPLICATIONS: None immediate. PROCEDURE: Informed written consent was obtained from the patient after a thorough discussion of the procedural risks, benefits and alternatives. All questions were addressed. Maximal Sterile Barrier Technique was utilized including caps, mask, sterile gowns, sterile gloves, sterile drape, hand hygiene and skin antiseptic. A timeout was performed prior to the initiation  of the procedure. The right lower quadrant was prepped and draped in a sterile fashion. 1% lidocaine was utilized for local anesthesia. The 35 French drain was exchanged over a Bentson wire for a Kumpe catheter. The catheter was retracted into the abscess. Contrast injected fills the abscess. The Kumpe was then exchanged over a Bentson for a 12 [place] drain. It was looped and string fixed. Contrast was injected. Frank pus was aspirated. FINDINGS: Images demonstrate exchange of the right lower quadrant 12 French drain. The tip is now positioned in the abscess cavity. IMPRESSION: Successful right lower quadrant abscess drain exchange. The tip is now coiled in the abscess cavity. Frank pus was aspirated. Electronically Signed   By: Marybelle Killings M.D.   On: 05/29/2017 14:44    Procedures ERCP with stent placement, 03/21 IR drain placement, 03/22 IR drain tube exchange, 03/27  HPI: Nathan Baxter is a 72yo male PMH HTN and HLD with recent admission 05/02/17 for gallstone pancreatitis during which he underwent laparoscopic cholecystectomy 05/06/17 by Dr. Barry Dienes. Unable to perform IOC during surgery due to of the infundibulum. Patient did have a preoperative MRCP that showed no signs of biliary tract obstruction. He was discharged 05/08/17 in good condition. He was seen in our office about 1 week postoperative and found to have an increased alk phos and lipase with persistently elevated bilirubin. He was advised to come to the ED but initially declined because he did not feel too bad.  Patient presented to the ED yesterday complaining of persistent abdominal pain and bloating, nausea, diarrhea, and weakness. States that the pain has been intermittent. Sometimes dull lower abdominal pain with intermittent sharp shooting pain across his abdomen. Some days he had no pain, other days it was consistent throughout the day. Pain not worse with PO intake. Denies fever or chills. He has only had liquids PO since Monday.   In  the ED patient had an abdominal  CT scan that showed persistent peripancreatic fat stranding about the head and body with small amount peripancreatic fluid suspicious for pancreatitis, moderate volume abdominopelvic ascites, and small fluid density structure adjacent to surgical clips in the gallbladder fossa. Lipase 124, WBC 12.2, AST 59, ALT 62, alk phos 308, total bilirubin 2.3.    Hospital Course:  Pt was admitted and HIDA scan was done to evaluate for bile leak. HIDA was positive for bile leak. GI performed an ERCP with stent placement and IR drained the intraabdominal fluid collection. Pain did not improve as anticipated so repeat CT scan was performed on 03/25 which showed fluid collection in gallbladder fossa.  The drain stopped having output so IR replaced the tubing with successful drainage. Drainage was slightly purulent so pt was give PO antibiotics at discharge. Diet was advanced as tolerated.  On 03/28, the patient was voiding well, tolerating diet, ambulating well, pain well controlled, vital signs stable, drain site c/d/i and felt stable for discharge home.  Patient will follow up in our office in 2 weeks and knows to call with questions or concerns. He knows to follow up with IR regarding drain and GI.   Patient was discharged in good condition.  The [place] Substance controlled database was reviewed prior to prescribing narcotic pain medication to this patient.  Physical Exam: Gen: Alert, NAD, pleasant, cooperative Card: RRR Pulm: CTA, no W/R/R, rate and effort normal Abd: Soft,obese,nodistention,+BS,drain site C/D/I, drain with scant serous drainage, mild TTP in RUQ, no guarding or peritonitis. Skin: no rashes noted, warm and dry  Allergies as of 05/30/2017      Reactions   Sulfa Antibiotics Rash      Medication List    TAKE these medications   amLODipine 10 MG tablet Commonly known as:  NORVASC Take 1 tablet (10 mg total) by mouth daily.    amoxicillin-clavulanate 875-125 MG tablet Commonly known as:  AUGMENTIN Take 1 tablet by mouth every 12 (twelve) hours.   metoprolol tartrate 25 MG tablet Commonly known as:  LOPRESSOR Take 0.5 tablets (12.5 mg total) by mouth 2 (two) times daily.   simethicone 80 MG chewable tablet Commonly known as:  MYLICON Chew 2 tablets (160 mg total) by mouth 4 (four) times daily as needed for flatulence.   traMADol 50 MG tablet Commonly known as:  ULTRAM Take 1 tablet (50 mg total) by mouth every 6 (six) hours as needed for moderate pain. What changed:  Another medication with the same name was added. Make sure you understand how and when to take each.   traMADol 50 MG tablet Commonly known as:  ULTRAM Take 1-2 tablets (50-100 mg total) by mouth every 6 (six) hours as needed for moderate pain or severe pain. What changed:  You were already taking a medication with the same name, and this prescription was added. Make sure you understand how and when to take each.        Follow-up Information    Willia Craze, NP Follow up on 06/24/2017.   Specialty:  Gastroenterology Why:  9:30 AM office follow up with gastro, Dr Loletha Carrow', nurse practitioner.  This replaces appt set for 3/28, which was cancelled.   Contact information: [place] [address] [phone]        Stark Klein, MD. Go on 06/14/2017.   Specialty:  General Surgery Why:  Your appointment is  06/14/17 at 10:00AM. Please arrive 15 minutes prior to your appointment to check in. Contact information: [phone] [place] [address] [phone]        Markus Daft, MD Follow up.   Specialties:  Interventional Radiology, Radiology Why:  You will hear from schedulers with date and time of appointment, expected in 1-2 weeks.  Contact information: [place] [address] [phone]           Signed: [facility] 05/30/2017,  10:23 AM Pager: [phone] Consults: [phone] Mon-Fri 7:00 am-4:30 pm Sat-Sun 7:00 am-11:30 am

## 2017-05-31 ENCOUNTER — Other Ambulatory Visit: Payer: Self-pay | Admitting: General Surgery

## 2017-05-31 DIAGNOSIS — R188 Other ascites: Secondary | ICD-10-CM

## 2017-06-05 ENCOUNTER — Ambulatory Visit
Admission: RE | Admit: 2017-06-05 | Discharge: 2017-06-05 | Disposition: A | Discharge status: Home / Self Care | Payer: Medicare HMO | Source: Ambulatory Visit | Attending: General Surgery | Admitting: General Surgery

## 2017-06-05 ENCOUNTER — Ambulatory Visit
Admission: RE | Admit: 2017-06-05 | Discharge: 2017-06-05 | Disposition: A | Discharge status: Home / Self Care | Payer: Medicare HMO | Source: Ambulatory Visit | Attending: Student | Admitting: Student

## 2017-06-05 ENCOUNTER — Ambulatory Visit: Payer: Medicare HMO | Admitting: Nurse Practitioner

## 2017-06-05 ENCOUNTER — Encounter: Payer: Self-pay | Admitting: Radiology

## 2017-06-05 DIAGNOSIS — R188 Other ascites: Secondary | ICD-10-CM

## 2017-06-05 DIAGNOSIS — K9189 Other postprocedural complications and disorders of digestive system: Secondary | ICD-10-CM | POA: Diagnosis not present

## 2017-06-05 HISTORY — PX: IR RADIOLOGIST EVAL & MGMT: IMG5224

## 2017-06-05 MED ORDER — IOPAMIDOL (ISOVUE-300) INJECTION 61%
125.0000 mL | Freq: Once | INTRAVENOUS | Status: AC | PRN
  Administered 2017-06-05: 125 mL via INTRAVENOUS

## 2017-06-05 NOTE — Progress Notes (Signed)
Referring Physician(s): Dr Marilynne Halsted  Chief Complaint: The patient is seen in follow up today s/p  3/22: CT-guided placement of a drainage catheter within the right abdominal fluid collection. 3/27: Successful right lower quadrant abscess drain exchange. The tip is now coiled in the abscess cavity.  History of present illness:  Abdominal pain Pancreatitis Bile leak s/p cholecystectomy Intraabdominal fluid collection  Doing well Little output for few days Flushing at home- comes out at drain site Denies N/V Denies fever/chills  Bag has 10 cc thin yellow fluid  (collection of 2 days)  Was sent home on Augmentin-- Discontinued use secondary diarrhea Has appt with Dr Donell Beers April 12  Past Medical History:  Diagnosis Date  . Acute biliary pancreatitis   . Aneurysm of infrarenal abdominal aorta (HCC)   . Cholecystitis   . HLD (hyperlipidemia)   . HTN (hypertension)   . Prediabetes     Past Surgical History:  Procedure Laterality Date  . CHOLECYSTECTOMY N/A 05/06/2017   Procedure: LAPAROSCOPIC CHOLECYSTECTOMY;  Surgeon: Almond Lint, MD;  Location: [facility];  Service: General;  Laterality: N/A;  . ERCP N/A 05/23/2017   Procedure: ENDOSCOPIC RETROGRADE CHOLANGIOPANCREATOGRAPHY (ERCP);  Surgeon: Sherrilyn Rist, MD;  [facility] END[facility]COPY;  Service: Gastroenterology;  Laterality: N/A;  . IR CATHETER TUBE CHANGE  05/29/2017  . IR RADIOLOGIST EVAL & MGMT  06/05/2017    Allergies: Sulfa antibiotics  Medications: Prior to Admission medications   Medication Sig Start Date End Date Taking? Authorizing Provider  amLODipine (NORVASC) 10 MG tablet Take 1 tablet (10 mg total) by mouth daily. 05/09/17   Rodolph Bong, MD  amoxicillin-clavulanate (AUGMENTIN) 875-125 MG tablet Take 1 tablet by mouth every 12 (twelve) hours. 05/30/17   Focht, Joyce Copa, PA  metoprolol tartrate (LOPRESSOR) 25 MG tablet Take 0.5 tablets (12.5 mg total) by mouth 2 (two) times daily. 05/08/17    Rodolph Bong, MD  simethicone (MYLICON) 80 MG chewable tablet Chew 2 tablets (160 mg total) by mouth 4 (four) times daily as needed for flatulence. 05/08/17   Rodolph Bong, MD  traMADol (ULTRAM) 50 MG tablet Take 1 tablet (50 mg total) by mouth every 6 (six) hours as needed for moderate pain. 05/08/17   Rodolph Bong, MD  traMADol (ULTRAM) 50 MG tablet Take 1-2 tablets (50-100 mg total) by mouth every 6 (six) hours as needed for moderate pain or severe pain. 05/30/17   Jerre Simon, PA     Family History  Problem Relation Age of Onset  . Breast cancer Mother   . Hypertension Father   . Hypertension Sister     Social History   Socioeconomic History  . Marital status: Single    Spouse name: Not on file  . Number of children: Not on file  . Years of education: Not on file  . Highest education level: Not on file  Occupational History  . Not on file  Social Needs  . Financial resource strain: Not on file  . Food insecurity:    Worry: Not on file    Inability: Not on file  . Transportation needs:    Medical: Not on file    Non-medical: Not on file  Tobacco Use  . Smoking status: Former Games developer  . Smokeless tobacco: Never Used  Substance and Sexual Activity  . Alcohol use: Yes    Frequency: Never  . Drug use: No  . Sexual activity: Not on file  Lifestyle  . Physical activity:  Days per week: Not on file    Minutes per session: Not on file  . Stress: Not on file  Relationships  . Social connections:    Talks on phone: Not on file    Gets together: Not on file    Attends religious service: Not on file    Active member of club or organization: Not on file    Attends meetings of clubs or organizations: Not on file    Relationship status: Not on file  Other Topics Concern  . Not on file  Social History Narrative  . Not on file     Vital Signs: BP (!) 146/87 (BP Location: Right Arm)   Pulse 71   Temp 97.7 F (36.5 C)   SpO2 100%   Physical Exam    Constitutional: He is oriented to person, place, and time.  Musculoskeletal: Normal range of motion.  Neurological: He is alert and oriented to person, place, and time.  Skin: Skin is warm and dry.  Site is clean ad dry NT No bleeding No sign of infection OP thin milky color-- minimal in bag (2 day collection approx 10 cc)  Injection performed and reviewed with Dr Bonnielee HaffHoss No communication to biliary tree Removed without complication per Dr Bonnielee HaffHoss     Imaging: Ct Abdomen Pelvis W Contrast  Result Date: 06/05/2017 CLINICAL DATA:  Biloma drain EXAM: CT ABDOMEN AND PELVIS WITH CONTRAST TECHNIQUE: Multidetector CT imaging of the abdomen and pelvis was performed using the standard protocol following bolus administration of intravenous contrast. CONTRAST:  100 cc Isovue 300 COMPARISON:  05/27/2017 FINDINGS: Lower chest: Subsegmental atelectasis at the right lung base. 5 mm nodule in the lingula. Hepatobiliary: Pneumobilia is present. A common bile duct stent is in place. The drain is positioned anterior to the liver. The perihepatic fluid collection anterior to the liver has resolved. A tiny amount of free intraperitoneal gas likely from drain injection is present anterior to the left lobe of the liver. A 2.7 cm fluid collection in the gallbladder fossa is improved compared with 3.4 cm in the prior study. Diffuse hepatic steatosis. Pancreas: Pancreatic parenchyma is within normal limits. Fluid density in the lesser sac, anterior to the pancreatic head, is stable. Spleen: Trace free-fluid lateral to the spleen is unchanged. Adrenals/Urinary Tract: Kidneys are within normal limits. Adrenal glands are within normal limits. Fluid density medial to the right kidney is stable. Stomach/Bowel: No evidence of small-bowel obstruction. No obvious mass in the colon. Vascular/Lymphatic: Atherosclerotic calcifications and some smooth plaque in the infrarenal abdominal aorta is stable. Maximal diameter of the abdominal  aorta is 3.1 cm. Reproductive: Prostate is unremarkable. Other: There is a small amount of free fluid in the pelvis. Stranding in the fat anterior to the transverse colon is improved. Musculoskeletal: No vertebral compression deformity. IMPRESSION: After improved positioning of the drain, the perihepatic fluid collection has resolved. A small gallbladder fossa fluid collection is improved. Stable common bile duct stent. Fluid collections anterior to the head of the pancreas and medial to the right kidney are stable. 5 mm nodule in the lingula. No follow-up needed if patient is low-risk. Non-contrast chest CT can be considered in 12 months if patient is high-risk. This recommendation follows the consensus statement: Guidelines for Management of Incidental Pulmonary Nodules Detected on CT Images: From the Fleischner Society 2017; Radiology 2017; 284:228-243. Electronically Signed   By: Jolaine ClickArthur  Hoss M.D.   On: 06/05/2017 14:51   Ir Radiologist Eval & Mgmt  Result Date: 06/05/2017  Please refer to notes tab for details about interventional procedure. (Op Note)   Labs:  CBC: Recent Labs    05/26/17 0631 05/27/17 0655 05/29/17 0452 05/30/17 0458  WBC 11.2* 17.2* 12.2* 7.0  HGB 11.4* 11.3* 11.9* 12.0*  HCT 34.2* 33.9* 36.4* 37.2*  PLT 472* 399 574* 649*    COAGS: Recent Labs    05/22/17 1531  INR 1.14    BMP: Recent Labs    05/26/17 0631 05/27/17 0655 05/29/17 0452 05/30/17 0458  NA 136 136 134* 135  K 3.7 3.8 4.5 4.0  CL 102 103 98* 100*  CO2 27 23 26 26   GLUCOSE 131* 161* 134* 131*  BUN 14 13 9 11   CALCIUM 8.1* 8.2* 8.5* 8.2*  CREATININE 0.65 0.62 0.67 0.72  GFRNONAA >60 >60 >60 >60  GFRAA >60 >60 >60 >60    LIVER FUNCTION TESTS: Recent Labs    05/26/17 0631 05/27/17 0655 05/29/17 0452 05/30/17 0458  BILITOT 1.2 1.1 0.8 0.8  AST 38 38 37 29  ALT 41 45 44 36  ALKPHOS 183* 167* 170* 148*  PROT 5.0* 5.0* 5.6* 5.6*  ALBUMIN 1.7* 1.7* 1.8* 1.8*     Assessment:  Post Cholecystectomy bile leak Drain placed 3/22 and repositioned 3/27 CT today reveals collection has resolved Injection shows NO communication to Biliary tree Drain removed per Dr Bonnielee Haff -- without complication Pt to see Dr Donell Beers April 12.    SignedRobet Leu, PA-C 06/05/2017, 3:12 PM   Please refer to Dr. Bonnielee Haff attestation of this note for management and plan.

## 2017-06-12 ENCOUNTER — Other Ambulatory Visit: Payer: Medicare HMO

## 2017-06-14 DIAGNOSIS — K9189 Other postprocedural complications and disorders of digestive system: Secondary | ICD-10-CM | POA: Diagnosis not present

## 2017-06-18 DIAGNOSIS — K8512 Biliary acute pancreatitis with infected necrosis: Secondary | ICD-10-CM | POA: Diagnosis not present

## 2017-06-18 DIAGNOSIS — I714 Abdominal aortic aneurysm, without rupture: Secondary | ICD-10-CM | POA: Diagnosis not present

## 2017-06-18 DIAGNOSIS — I10 Essential (primary) hypertension: Secondary | ICD-10-CM | POA: Diagnosis not present

## 2017-06-24 ENCOUNTER — Ambulatory Visit (INDEPENDENT_AMBULATORY_CARE_PROVIDER_SITE_OTHER): Payer: Medicare HMO | Admitting: Nurse Practitioner

## 2017-06-24 ENCOUNTER — Encounter: Payer: Self-pay | Admitting: Nurse Practitioner

## 2017-06-24 VITALS — BP 130/82 | HR 68 | Ht 71.5 in | Wt 213.2 lb

## 2017-06-24 DIAGNOSIS — K839 Disease of biliary tract, unspecified: Secondary | ICD-10-CM

## 2017-06-24 NOTE — Progress Notes (Signed)
Chief Complaint: bile duct leak       ASSESSMENT AND PLAN;   1. Gallstone pancreatitis.  72 yo male with recent pancreatitis, presumably gallstone related. Had lap chole 05/06/17,  readmitted several days later with post-op bile leak, s/p ERCP with sphincterotomy and stent placement followed by IR placement of perc drain for biloma. Biloma resolved, drain removed. Patient here for hospital follow -Patient is already scheduled for ERCP with stent removal early April but he doesn't have anyone to take him home after procedure and says this will not change. He may not qualify for observational stay after procedure but I don't know of any other way to get this accomplished. [facility]. I explained that [facility] and that is where he would go for observational stay following procedure.   2. Weight loss, ~ 30 pounds per Epic since cholecystectomy.    3. AAA, 3.3 cm    4. HTN, treated    5. Colon cancer screening - last colonoscopy 15 years ago. Hgb stable. At some point he should have screening but transportation is a problem for him at this point. .  5. 444 HPI:    72 yo male with recent admission for pancreatitis, presumably gallstone related.  No choledocholithiasis  / duct dilation on imaging but MRCP did show cholelithiasis and liver labs were abnormal.  He underwent perc drain placement by IR for management of biloma and ERCP with sphincterotomy and plastic stent placement for management of the leak. Perc drain removed several days ago after imaging showed resolution of biloma . Patient here for hospital follow. He feels fine. No further abdominal pain. Eating regular diet. [facility] May.   Past Medical History:  Diagnosis Date  . Acute biliary pancreatitis   . Aneurysm of infrarenal abdominal aorta (HCC)   . Cholecystitis   . HLD (hyperlipidemia)   . HTN  (hypertension)   . Prediabetes      Past Surgical History:  Procedure Laterality Date  . CHOLECYSTECTOMY N/A 05/06/2017   Procedure: LAPAROSCOPIC CHOLECYSTECTOMY;  Surgeon: Almond LintByerly, Faera, MD;  Location: [facility];  Service: General;  Laterality: N/A;  . ERCP N/A 05/23/2017   Procedure: ENDOSCOPIC RETROGRADE CHOLANGIOPANCREATOGRAPHY (ERCP);  Surgeon: Sherrilyn Ristanis, Henry L III, MD;  Location[facility] ENDOSCOPY;  Service: Gastroenterology;  Laterality: N/A;  . IR CATHETER TUBE CHANGE  05/29/2017  . IR RADIOLOGIST EVAL & MGMT  06/05/2017   Family History  Problem Relation Age of Onset  . Breast cancer Mother   . Hypertension Father   . Hypertension Sister    Social History   Tobacco Use  . Smoking status: Former Games developermoker  . Smokeless tobacco: Never Used  Substance Use Topics  . Alcohol use: Yes    Frequency: Never  . Drug use: No   Current Outpatient Medications  Medication Sig Dispense Refill  . amLODipine (NORVASC) 10 MG tablet Take 1 tablet (10 mg total) by mouth daily. 30 tablet 0   No current facility-administered medications for this visit.    Allergies  Allergen Reactions  . Sulfa Antibiotics Rash    Review of Systems: No fevers. No chest pain. No sob. No urinary sx.   Physical Exam:    Wt Readings from Last 3 Encounters:  06/24/17 213 lb 3.2 oz (96.7 kg)  05/29/17 217 lb 1.6 oz (98.5 kg)  05/02/17 244 lb (110.7  kg)    BP 130/82   Pulse 68   Ht 5' 11.5" (1.816 m)   Wt 213 lb 3.2 oz (96.7 kg)   BMI 29.32 kg/m  Constitutional:  Well-developed, white male in no acute distress. Psychiatric: pleasant mood and affect. Behavior is normal. EENT: Pupils normal.  Conjunctivae are normal. No scleral icterus. Neck supple.  Cardiovascular: Normal rate, regular rhythm. No edema Pulmonary/chest: Effort normal and breath sounds normal. No wheezing, rales or rhonchi. Abdominal: Soft, nondistended. Nontender. Bowel sounds active throughout. There are no masses palpable. No  hepatomegaly. Neurological: Alert and oriented to person place and time. Skin: Skin is warm and dry. No rashes noted.  Willette Cluster, NP  06/24/2017, 10:05 AM

## 2017-06-24 NOTE — Patient Instructions (Signed)
If you are age 73 or older, your body mass index should be between 23-30. Your Body mass index is 29.32 kg/m. If this is out of the aforementioned range listed, please consider follow up with your Primary Care Provider.  If you are age 73 or younger, your body mass index should be between 19-25. Your Body mass index is 29.32 kg/m. If this is out of the aformentioned range listed, please consider follow up with your Primary Care Provider.   You have been scheduled for an endoscopy. Please follow written instructions given to you at your visit today. If you use inhalers (even only as needed), please bring them with you on the day of your procedure. Your physician has requested that you go to www.startemmi.com and enter the access code given to you at your visit today. This web site gives a general overview about your procedure. However, you should still follow specific instructions given to you by our office regarding your preparation for the procedure.  Thank you for choosing me and [facility].   Willette ClusterPaula Guenther, NP

## 2017-06-27 NOTE — Progress Notes (Signed)
Thank you for sending this case to me. I have reviewed the entire note.  There is no indication for hospital admission after this outpatient procedure, and I am not offering that. Also, the stent must be removed so it does not clog and cause bile duct obstruction and infection, and I have very limited hospital endoscopy times available. He simply must arrange a ride home from friend/family or by hiring a local service such as [employer], [employer].  Please have nursing contact this patient about that, because those plans must be made well before the day of his procedure on 5/6.    Amada JupiterHenry Danis, MD

## 2017-06-28 ENCOUNTER — Telehealth: Payer: Self-pay

## 2017-06-28 NOTE — Progress Notes (Signed)
Left message for patient to call back  

## 2017-06-28 NOTE — Telephone Encounter (Signed)
Left message for patient to please return my call.

## 2017-07-01 ENCOUNTER — Telehealth: Payer: Self-pay

## 2017-07-01 NOTE — Telephone Encounter (Signed)
Left message to call back. [facility] [employer] [phone]

## 2017-07-01 NOTE — Telephone Encounter (Signed)
-----   Message from Meredith Pel, NP sent at 06/30/2017  8:09 PM EDT ----- Nathan Baxter,  I saw this man in clinic last week for a hospital follow up. He has a plastic biliary stent in place for a bile leak . The stent needs to come out so he needs ERCP with Danis. In clinic the patient told me he could not get anyone to take him to the procedure and that would not be changing, I told him I would check into an observation stay after the procedure so he could let sedation wear off then drive himself home.   Dr. Myrtie Neither will not let him be admitted after the procedure. I agree it seems over the top and hospital would likely balk at it anyway. Can you try and help him get connected with [employer] to serve as a care partner? His procedure is already scheduled. Please let me know. Thanks

## 2017-07-01 NOTE — Telephone Encounter (Signed)
Patient has spoken with Rudolpho Sevin., RN He will contact [facility] and [employer] to arrange transportation. Procedure is 07/08/17 at 7:30 am.

## 2017-07-01 NOTE — Telephone Encounter (Signed)
Spoke to patient today, he states he had planned on calling [facility]oad for a ride. I told him that I did not think the hospital allowed that, but that I would check. After verifying with Dr. Myrtie Neither, patient must have a care partner with him and best way is to call [employer] or [employer] in order for a person to accompany him to procedure and back home. Patient understands that necessity of having the procedure and having a care partner with him throughout. He was given both phone numbers to call and arrange transportation and care. Stressed the need to call today to schedule.

## 2017-07-01 NOTE — Telephone Encounter (Addendum)
Agreed.  That was our discussion earlier today. Please call him mid week to make sure that he made arrangements.  -HD

## 2017-07-02 DIAGNOSIS — K8512 Biliary acute pancreatitis with infected necrosis: Secondary | ICD-10-CM | POA: Diagnosis not present

## 2017-07-02 DIAGNOSIS — I714 Abdominal aortic aneurysm, without rupture: Secondary | ICD-10-CM | POA: Diagnosis not present

## 2017-07-02 DIAGNOSIS — I10 Essential (primary) hypertension: Secondary | ICD-10-CM | POA: Diagnosis not present

## 2017-07-03 ENCOUNTER — Telehealth: Payer: Self-pay

## 2017-07-03 NOTE — Telephone Encounter (Signed)
Left message for patient to please call back, Dr. Myrtie Neither asked they I check to see if patient arranged for transportation. Asked patient to call back.

## 2017-07-03 NOTE — Telephone Encounter (Signed)
Spoke to patient, he has made arrangements with [employer]. He will confirm again with them on Friday.

## 2017-07-04 NOTE — Telephone Encounter (Signed)
Excellent, thank you for checking.

## 2017-07-05 NOTE — Progress Notes (Signed)
Unable to reach pt. On preop call LVM with instructions .

## 2017-07-08 ENCOUNTER — Ambulatory Visit (HOSPITAL_COMMUNITY): Payer: Medicare HMO | Admitting: Anesthesiology

## 2017-07-08 ENCOUNTER — Encounter (HOSPITAL_COMMUNITY): Payer: Self-pay | Admitting: *Deleted

## 2017-07-08 ENCOUNTER — Encounter (HOSPITAL_COMMUNITY)
Admission: RE | Disposition: A | Discharge status: Home / Self Care | Payer: Self-pay | Source: Ambulatory Visit | Attending: Gastroenterology

## 2017-07-08 ENCOUNTER — Ambulatory Visit (HOSPITAL_COMMUNITY)
Admission: RE | Admit: 2017-07-08 | Discharge: 2017-07-08 | Disposition: A | Discharge status: Home / Self Care | Payer: Medicare HMO | Source: Ambulatory Visit | Attending: Gastroenterology | Admitting: Gastroenterology

## 2017-07-08 ENCOUNTER — Ambulatory Visit (HOSPITAL_COMMUNITY): Payer: Medicare HMO

## 2017-07-08 ENCOUNTER — Other Ambulatory Visit: Payer: Self-pay

## 2017-07-08 DIAGNOSIS — Z882 Allergy status to sulfonamides status: Secondary | ICD-10-CM | POA: Diagnosis not present

## 2017-07-08 DIAGNOSIS — Z4659 Encounter for fitting and adjustment of other gastrointestinal appliance and device: Secondary | ICD-10-CM | POA: Insufficient documentation

## 2017-07-08 DIAGNOSIS — K9189 Other postprocedural complications and disorders of digestive system: Secondary | ICD-10-CM | POA: Diagnosis not present

## 2017-07-08 DIAGNOSIS — I10 Essential (primary) hypertension: Secondary | ICD-10-CM | POA: Diagnosis not present

## 2017-07-08 DIAGNOSIS — E785 Hyperlipidemia, unspecified: Secondary | ICD-10-CM | POA: Insufficient documentation

## 2017-07-08 DIAGNOSIS — Z4689 Encounter for fitting and adjustment of other specified devices: Secondary | ICD-10-CM

## 2017-07-08 DIAGNOSIS — K838 Other specified diseases of biliary tract: Secondary | ICD-10-CM | POA: Insufficient documentation

## 2017-07-08 DIAGNOSIS — K839 Disease of biliary tract, unspecified: Secondary | ICD-10-CM

## 2017-07-08 DIAGNOSIS — K8050 Calculus of bile duct without cholangitis or cholecystitis without obstruction: Secondary | ICD-10-CM

## 2017-07-08 DIAGNOSIS — Z9689 Presence of other specified functional implants: Secondary | ICD-10-CM | POA: Diagnosis not present

## 2017-07-08 DIAGNOSIS — Z87891 Personal history of nicotine dependence: Secondary | ICD-10-CM | POA: Insufficient documentation

## 2017-07-08 DIAGNOSIS — Z79899 Other long term (current) drug therapy: Secondary | ICD-10-CM | POA: Insufficient documentation

## 2017-07-08 HISTORY — PX: ERCP: SHX5425

## 2017-07-08 SURGERY — ERCP, WITH INTERVENTION IF INDICATED
Anesthesia: General

## 2017-07-08 MED ORDER — PROPOFOL 10 MG/ML IV BOLUS
INTRAVENOUS | Status: AC
  Filled 2017-07-08: qty 20

## 2017-07-08 MED ORDER — FENTANYL CITRATE (PF) 250 MCG/5ML IJ SOLN
INTRAMUSCULAR | Status: DC | PRN
  Administered 2017-07-08 (×2): 50 ug via INTRAVENOUS

## 2017-07-08 MED ORDER — SODIUM CHLORIDE 0.9 % IV SOLN
INTRAVENOUS | Status: DC | PRN
  Administered 2017-07-08: 50 mL

## 2017-07-08 MED ORDER — CIPROFLOXACIN IN D5W 400 MG/200ML IV SOLN
INTRAVENOUS | Status: DC | PRN
  Administered 2017-07-08: 400 mg via INTRAVENOUS

## 2017-07-08 MED ORDER — LACTATED RINGERS IV SOLN
INTRAVENOUS | Status: DC
  Administered 2017-07-08 (×2): via INTRAVENOUS

## 2017-07-08 MED ORDER — INDOMETHACIN 50 MG RE SUPP
RECTAL | Status: AC
  Filled 2017-07-08: qty 2

## 2017-07-08 MED ORDER — PROPOFOL 10 MG/ML IV BOLUS
INTRAVENOUS | Status: DC | PRN
  Administered 2017-07-08: 150 mg via INTRAVENOUS

## 2017-07-08 MED ORDER — GLUCAGON HCL RDNA (DIAGNOSTIC) 1 MG IJ SOLR
INTRAMUSCULAR | Status: AC
  Filled 2017-07-08: qty 1

## 2017-07-08 MED ORDER — FENTANYL CITRATE (PF) 250 MCG/5ML IJ SOLN
INTRAMUSCULAR | Status: AC
  Filled 2017-07-08: qty 5

## 2017-07-08 MED ORDER — MIDAZOLAM HCL 2 MG/2ML IJ SOLN
INTRAMUSCULAR | Status: AC
  Filled 2017-07-08: qty 2

## 2017-07-08 MED ORDER — SODIUM CHLORIDE 0.9 % IV SOLN: INTRAVENOUS | Status: DC

## 2017-07-08 MED ORDER — MIDAZOLAM HCL 2 MG/2ML IJ SOLN
INTRAMUSCULAR | Status: DC | PRN
  Administered 2017-07-08 (×2): 1 mg via INTRAVENOUS

## 2017-07-08 MED ORDER — SUGAMMADEX SODIUM 200 MG/2ML IV SOLN
INTRAVENOUS | Status: DC | PRN
  Administered 2017-07-08: 400 mg via INTRAVENOUS

## 2017-07-08 MED ORDER — ONDANSETRON HCL 4 MG/2ML IJ SOLN
INTRAMUSCULAR | Status: DC | PRN
  Administered 2017-07-08: 4 mg via INTRAVENOUS

## 2017-07-08 MED ORDER — ROCURONIUM BROMIDE 10 MG/ML (PF) SYRINGE
PREFILLED_SYRINGE | INTRAVENOUS | Status: DC | PRN
  Administered 2017-07-08: 60 mg via INTRAVENOUS

## 2017-07-08 MED ORDER — LIDOCAINE 2% (20 MG/ML) 5 ML SYRINGE
INTRAMUSCULAR | Status: DC | PRN
  Administered 2017-07-08: 40 mg via INTRAVENOUS

## 2017-07-08 MED ORDER — CIPROFLOXACIN IN D5W 400 MG/200ML IV SOLN
INTRAVENOUS | Status: AC
  Filled 2017-07-08: qty 200

## 2017-07-08 MED ORDER — INDOMETHACIN 50 MG RE SUPP
RECTAL | Status: DC | PRN
  Administered 2017-07-08: 100 mg via RECTAL

## 2017-07-08 NOTE — Discharge Instructions (Signed)
YOU HAD AN ENDOSCOPIC PROCEDURE TODAY: Refer to the procedure report and other information in the discharge instructions given to you for any specific questions about what was found during the examination. If this information does not answer your questions, please call [place] office at [phone] to clarify.  ° °YOU SHOULD EXPECT: Some feelings of bloating in the abdomen. Passage of more gas than usual. Walking can help get rid of the air that was put into your GI tract during the procedure and reduce the bloating. If you had a lower endoscopy (such as a colonoscopy or flexible sigmoidoscopy) you may notice spotting of blood in your stool or on the toilet paper. Some abdominal soreness may be present for a day or two, also. ° °DIET: Your first meal following the procedure should be a light meal and then it is ok to progress to your normal diet. A half-sandwich or bowl of soup is an example of a good first meal. Heavy or fried foods are harder to digest and may make you feel nauseous or bloated. Drink plenty of fluids but you should avoid alcoholic beverages for 24 hours. If you had a esophageal dilation, please see attached instructions for diet.   ° °ACTIVITY: Your care partner should take you home directly after the procedure. You should plan to take it easy, moving slowly for the rest of the day. You can resume normal activity the day after the procedure however YOU SHOULD NOT DRIVE, use power tools, machinery or perform tasks that involve climbing or major physical exertion for 24 hours (because of the sedation medicines used during the test).  ° °SYMPTOMS TO REPORT IMMEDIATELY: °A gastroenterologist can be reached at any hour. Please call [phone]  for any of the following symptoms:  °Following lower endoscopy (colonoscopy, flexible sigmoidoscopy) °Excessive amounts of blood in the stool  °Significant tenderness, worsening of abdominal pains  °Swelling of the abdomen that is new, acute  °Fever of 100° or  higher  °Following upper endoscopy (EGD, EUS, ERCP, esophageal dilation) °Vomiting of blood or coffee ground material  °New, significant abdominal pain  °New, significant chest pain or pain under the shoulder blades  °Painful or persistently difficult swallowing  °New shortness of breath  °Black, tarry-looking or red, bloody stools ° °FOLLOW UP:  °If any biopsies were taken you will be contacted by phone or by letter within the next 1-3 weeks. Call [phone]  if you have not heard about the biopsies in 3 weeks.  °Please also call with any specific questions about appointments or follow up tests. ° °

## 2017-07-08 NOTE — Anesthesia Procedure Notes (Signed)
Procedure Name: Intubation Date/Time: 07/08/2017 7:52 AM Performed by: Minerva Ends, CRNA Pre-anesthesia Checklist: Patient identified, Emergency Drugs available, Suction available and Patient being monitored Patient Re-evaluated:Patient Re-evaluated prior to induction Oxygen Delivery Method: Circle System Utilized Preoxygenation: Pre-oxygenation with 100% oxygen Induction Type: IV induction Ventilation: Mask ventilation without difficulty Laryngoscope Size: Miller and 2 Grade View: Grade I Tube type: Oral Tube size: 7.5 mm Number of attempts: 1 Airway Equipment and Method: Stylet Placement Confirmation: ETT inserted through vocal cords under direct vision,  positive ETCO2 and breath sounds checked- equal and bilateral Secured at: 22 cm Tube secured with: Tape Dental Injury: Teeth and Oropharynx as per pre-operative assessment  Comments: Smooth IV induction- Joslin-- intubation AM CRNA atraumatic-- teeth and mouth as preop-- no upper teeth and lower teeth chipped and broken-- unchanged after laryngoscopy-- bilat BS Joslin

## 2017-07-08 NOTE — H&P (Signed)
History:  This patient presents for endoscopic testing for bile leak.  Dorita Sciara Referring physician: Patient, No Pcp Per  Past Medical History: Past Medical History:  Diagnosis Date  . Acute biliary pancreatitis   . Aneurysm of infrarenal abdominal aorta (HCC)   . Cholecystitis   . HLD (hyperlipidemia)   . HTN (hypertension)   . Prediabetes      Past Surgical History: Past Surgical History:  Procedure Laterality Date  . CHOLECYSTECTOMY N/A 05/06/2017   Procedure: LAPAROSCOPIC CHOLECYSTECTOMY;  Surgeon: Almond Lint, MD;  Location: [facility];  Service: General;  Laterality: N/A;  . ERCP N/A 05/23/2017   Procedure: ENDOSCOPIC RETROGRADE CHOLANGIOPANCREATOGRAPHY (ERCP);  Surgeon: Sherrilyn Rist, MD;  Location: [facility] ENDOSCOPY;  Service: Gastroenterology;  Laterality: N/A;  . IR CATHETER TUBE CHANGE  05/29/2017  . IR RADIOLOGIST EVAL & MGMT  06/05/2017    Allergies: Allergies  Allergen Reactions  . Sulfa Antibiotics Rash    Outpatient Meds: Current Facility-Administered Medications  Medication Dose Route Frequency Provider Last Rate Last Dose  . 0.9 %  sodium chloride infusion   Intravenous Continuous Danis, Starr Lake III, MD      . lactated ringers infusion   Intravenous Continuous Charlie Pitter III, MD          ___________________________________________________________________ Objective   Exam:  BP 130/83   Pulse 62   Temp 99.2 F (37.3 C) (Oral)   Resp 10   Ht 5' 11.5" (1.816 m)   Wt 213 lb (96.6 kg)   SpO2 99%   BMI 29.29 kg/m    CV: RRR without murmur, S1/S2, no JVD, no peripheral edema  Resp: clear to auscultation bilaterally, normal RR and effort noted  GI: soft, no tenderness, with active bowel sounds. No guarding or palpable organomegaly noted.  Neuro: awake, alert and oriented x 3. Normal gross motor function and fluent speech   Assessment:  Bile leak  Plan:  ERCP with biliary stent removal and confirmation of bile leak  closure.   Charlie Pitter III

## 2017-07-08 NOTE — Anesthesia Preprocedure Evaluation (Addendum)
Anesthesia Evaluation  Patient identified by MRN, date of birth, ID band Patient awake    Reviewed: Allergy & Precautions, NPO status , Patient's Chart, lab work & pertinent test results  Airway Mallampati: II  TM Distance: >3 FB     Dental  (+) Edentulous Upper, Dental Advisory Given, Chipped, Missing, Poor Dentition   Pulmonary former smoker,    breath sounds clear to auscultation       Cardiovascular hypertension,  Rhythm:Regular Rate:Normal     Neuro/Psych    GI/Hepatic   Endo/Other    Renal/GU      Musculoskeletal   Abdominal   Peds  Hematology   Anesthesia Other Findings   Reproductive/Obstetrics                            Anesthesia Physical Anesthesia Plan  ASA: II  Anesthesia Plan: General   Post-op Pain Management:    Induction: Intravenous  PONV Risk Score and Plan: Ondansetron and Dexamethasone  Airway Management Planned: Oral ETT  Additional Equipment:   Intra-op Plan:   Post-operative Plan: Extubation in OR  Informed Consent: I have reviewed the patients History and Physical, chart, labs and discussed the procedure including the risks, benefits and alternatives for the proposed anesthesia with the patient or authorized representative who has indicated his/her understanding and acceptance.   Dental advisory given  Plan Discussed with: CRNA and Anesthesiologist  Anesthesia Plan Comments:         Anesthesia Quick Evaluation

## 2017-07-08 NOTE — Op Note (Signed)
[facility] Patient Name: Nathan Baxter Procedure Date: 07/08/2017 MRN: [id number] Attending MD: Starr Lake. Myrtie Neither , MD Date of Birth: Aug 02, 1945 CSN: [id number] Age: 72 Admit Type: Outpatient Procedure:                ERCP Indications:              Bile leak, Biliary stent removal Providers:                Sherilyn Cooter L. Myrtie Neither, MD, Dwain Sarna, RN, Kandice Robinsons, Technician Referring MD:             Almond Lint MD Medicines:                General Anesthesia, Cipro 400 mg IV, Indomethacin                            100 mg PR Complications:            No immediate complications. Estimated Blood Loss:     Estimated blood loss: none. Procedure:                Pre-Anesthesia Assessment:                           - Prior to the procedure, a History and Physical                            was performed, and patient medications and                            allergies were reviewed. The patient's tolerance of                            previous anesthesia was also reviewed. The risks                            and benefits of the procedure and the sedation                            options and risks were discussed with the patient.                            All questions were answered, and informed consent                            was obtained. Prior Anticoagulants: The patient has                            taken no previous anticoagulant or antiplatelet                            agents. ASA Grade Assessment: II - A patient with  mild systemic disease. After reviewing the risks                            and benefits, the patient was deemed in                            satisfactory condition to undergo the procedure.                           After obtaining informed consent, the scope was                            passed under direct vision. Throughout the                            procedure, the patient's blood  pressure, pulse, and                            oxygen saturations were monitored continuously. The                            [id number] ([id number]) scope was introduced through                            the mouth, and used to inject contrast into and                            used to inject contrast into the bile duct. The                            ERCP was accomplished without difficulty. The                            patient tolerated the procedure well. Scope In: Scope Out: Findings:      A biliary stent and RUQ clips were visible on the scout film. The       esophagus was successfully intubated under direct vision. The scope was       advanced to a normal major papilla in the descending duodenum without       detailed examination of the pharynx, larynx and associated structures,       and upper GI tract. The upper GI tract was grossly normal. One stent was       removed from the biliary tree using a rat-toothed forceps and snare. A       biliary sphincterotomy had been performed. The sphincterotomy appeared       open with clear bile flowing from it. 0.035 inch x 260 cm straight Hydra       Jagwire was passed into the biliary tree. The traction (standard)       sphincterotome was passed over the guidewire and the bile duct was then       deeply cannulated. Contrast was injected. I personally interpreted the       bile duct images. There was brisk flow of contrast through the ducts.       Image quality was excellent. Contrast extended  to the hepatic ducts. The       intra-hepatic and extra-hepatic biliary duct system was normal. There       was faint opacification of the gallbladder remnant without contrast       extravasation. The total fluoroscopy exposure time was 1 minute and 51       seconds. The endoscope was withdrawn from the patient. Impression:               - Prior biliary sphincterotomy appeared open.                           - The cholangiogram was normal. No further  bile                            leak.                           - One stent was removed from the biliary tree. Moderate Sedation:      GETA Recommendation:           - Patient has a contact number available for                            emergencies. The signs and symptoms of potential                            delayed complications were discussed with the                            patient. Return to normal activities tomorrow.                            Written discharge instructions were provided to the                            patient.                           - Continue present medications.                           - Resume previous diet. Procedure Code(s):        --- Professional ---                           [phone], Endoscopic retrograde                            cholangiopancreatography (ERCP); with removal of                            foreign body(s) or stent(s) from biliary/pancreatic                            duct(s) Diagnosis Code(s):        --- Professional ---  Z46.59, Encounter for fitting and adjustment of                            other gastrointestinal appliance and device                           K83.8, Other specified diseases of biliary tract CPT copyright 2017 [employer]. All rights reserved. The codes documented in this report are preliminary and upon coder review may  be revised to meet current compliance requirements. Henry L. Myrtie Neither, MD 07/08/2017 8:41:53 AM This report has been signed electronically. Number of Addenda: 0

## 2017-07-08 NOTE — Transfer of Care (Signed)
Immediate Anesthesia Transfer of Care Note  Patient: Nathan Baxter  Procedure(s) Performed: ENDOSCOPIC RETROGRADE CHOLANGIOPANCREATOGRAPHY (ERCP) (N/A )  Patient Location: Endoscopy Unit  Anesthesia Type:General  Level of Consciousness: awake and alert   Airway & Oxygen Therapy: Patient Spontanous Breathing and Patient connected to face mask oxygen  Post-op Assessment: Report given to RN and Post -op Vital signs reviewed and stable  Post vital signs: Reviewed and stable  Last Vitals:  Vitals Value Taken Time  BP    Temp    Pulse 76 07/08/2017  8:54 AM  Resp 20 07/08/2017  8:54 AM  SpO2 100 % 07/08/2017  8:54 AM  Vitals shown include unvalidated device data.  Last Pain:  Vitals:   07/08/17 1610  TempSrc: Oral  PainSc:          Complications: No apparent anesthesia complications

## 2017-07-08 NOTE — Interval H&P Note (Signed)
History and Physical Interval Note:  07/08/2017 7:36 AM  Nathan Baxter  has presented today for surgery, with the diagnosis of bile leak  The various methods of treatment have been discussed with the patient and family. After consideration of risks, benefits and other options for treatment, the patient has consented to  Procedure(s): ENDOSCOPIC RETROGRADE CHOLANGIOPANCREATOGRAPHY (ERCP) (N/A) as a surgical intervention .  The patient's history has been reviewed, patient examined, no change in status, stable for surgery.  I have reviewed the patient's chart and labs.  Questions were answered to the patient's satisfaction.     Charlie Pitter III

## 2017-07-08 NOTE — Anesthesia Procedure Notes (Signed)
Date/Time: 07/08/2017 8:47 AM Performed by: Minerva Ends, CRNA Oxygen Delivery Method: Simple face mask Placement Confirmation: positive ETCO2 and breath sounds checked- equal and bilateral Dental Injury: Teeth and Oropharynx as per pre-operative assessment

## 2017-07-08 NOTE — Anesthesia Postprocedure Evaluation (Signed)
Anesthesia Post Note  Patient: Nathan Baxter  Procedure(s) Performed: ENDOSCOPIC RETROGRADE CHOLANGIOPANCREATOGRAPHY (ERCP) (N/A )     Patient location during evaluation: PACU Anesthesia Type: General Level of consciousness: awake and alert Pain management: pain level controlled Vital Signs Assessment: post-procedure vital signs reviewed and stable Respiratory status: spontaneous breathing, nonlabored ventilation, respiratory function stable and patient connected to nasal cannula oxygen Cardiovascular status: blood pressure returned to baseline and stable Postop Assessment: no apparent nausea or vomiting Anesthetic complications: no    Last Vitals:  Vitals:   07/08/17 0935 07/08/17 0940  BP:  138/71  Pulse: 66 66  Resp: (!) 4 (!) 26  Temp:    SpO2: 99% 95%    Last Pain:  Vitals:   07/08/17 0940  TempSrc:   PainSc: 0-No pain                 Jamelah Sitzer COKER

## 2017-07-11 ENCOUNTER — Encounter (HOSPITAL_COMMUNITY): Payer: Self-pay | Admitting: Gastroenterology

## 2017-07-11 ENCOUNTER — Telehealth: Payer: Self-pay | Admitting: Gastroenterology

## 2017-07-11 NOTE — Telephone Encounter (Signed)
Pt called and states he thinks the anesthesiologist cracked his tooth, loosened his tooth and may have knocked some of the filling out. Pt states his tooth was quite sore after the procedure, states it is the lower lateral incisor on the left side. Pt reports he goes to his dentist in June and if he has to have repair done he wants the cost of that dental bill to be deducted from his anesthesia bill. Discussed with him that I would let Dr. Myrtie Neither know that this is something he would have to discuss with [facility] anesthesia.

## 2017-07-12 NOTE — Telephone Encounter (Signed)
Dr. Janean Sark was the anesthesiologist and Heron Nay CRNA. Note forwarded to both provider.

## 2017-07-12 NOTE — Telephone Encounter (Signed)
Thanks for letting me know. I am sorry to hear about the trouble. Yes, that is something to be addressed by the anesthesiologist, and they should call the patient to decide how to proceed. I do not recall the anesthesiologist or CRNA who did his case.  Please find that out from the chart and contact the anesthesia department so that MD can be alerted and proceed accordingly.

## 2017-07-18 DIAGNOSIS — R69 Illness, unspecified: Secondary | ICD-10-CM | POA: Diagnosis not present

## 2017-07-23 DIAGNOSIS — I714 Abdominal aortic aneurysm, without rupture: Secondary | ICD-10-CM | POA: Diagnosis not present

## 2017-07-23 DIAGNOSIS — I10 Essential (primary) hypertension: Secondary | ICD-10-CM | POA: Diagnosis not present

## 2017-08-20 DIAGNOSIS — R69 Illness, unspecified: Secondary | ICD-10-CM | POA: Diagnosis not present

## 2017-10-02 DIAGNOSIS — Z0100 Encounter for examination of eyes and vision without abnormal findings: Secondary | ICD-10-CM | POA: Diagnosis not present

## 2017-12-25 DIAGNOSIS — R69 Illness, unspecified: Secondary | ICD-10-CM | POA: Diagnosis not present

## 2018-01-23 DIAGNOSIS — R5382 Chronic fatigue, unspecified: Secondary | ICD-10-CM | POA: Diagnosis not present

## 2018-01-23 DIAGNOSIS — R1011 Right upper quadrant pain: Secondary | ICD-10-CM | POA: Diagnosis not present

## 2018-01-23 DIAGNOSIS — Z0000 Encounter for general adult medical examination without abnormal findings: Secondary | ICD-10-CM | POA: Diagnosis not present

## 2018-01-23 DIAGNOSIS — I714 Abdominal aortic aneurysm, without rupture: Secondary | ICD-10-CM | POA: Diagnosis not present

## 2018-01-23 DIAGNOSIS — I10 Essential (primary) hypertension: Secondary | ICD-10-CM | POA: Diagnosis not present

## 2018-01-23 DIAGNOSIS — Z13220 Encounter for screening for lipoid disorders: Secondary | ICD-10-CM | POA: Diagnosis not present

## 2018-08-26 IMAGING — XA IR CATHETER TUBE CHANGE
2 series · 3 of 3 positions shown · non-contrast
Comparison: none

INDICATION: Reposition abscess drain.  Bile leak.

[Series 1: fl (-) angio · 1 of 1 slices shown (1 of 2)]
[im 1/1]
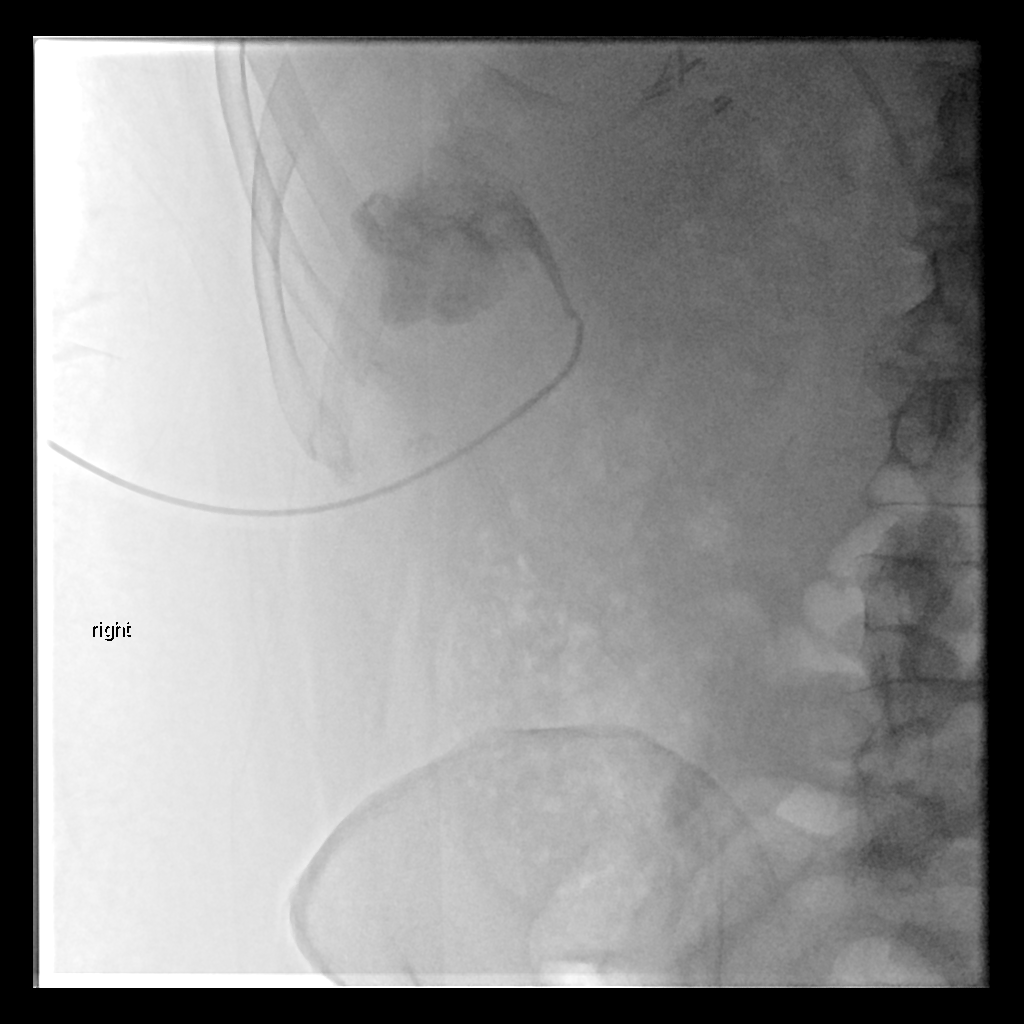

[Series 2: fl (-) angio · 2 of 2 slices shown (2 of 2)]
[im 1/2]
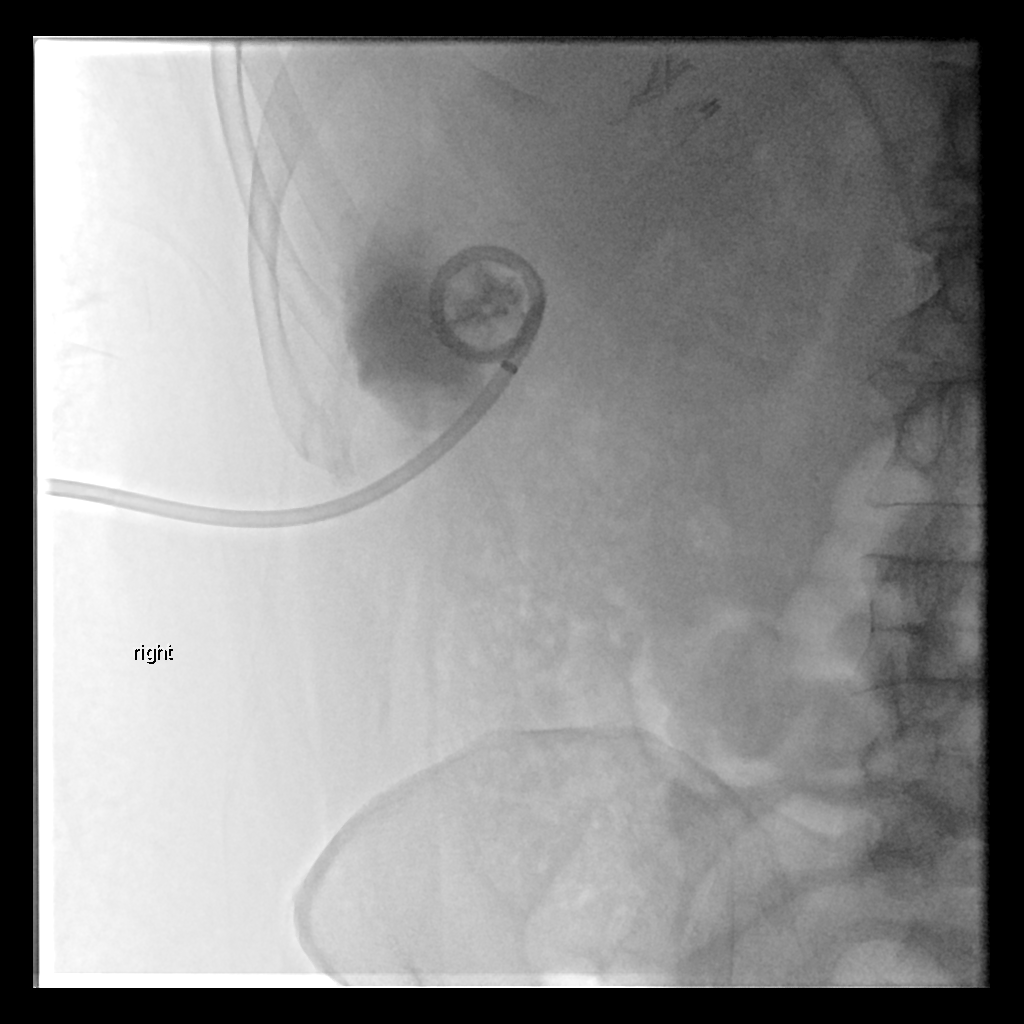
[im 2/2]
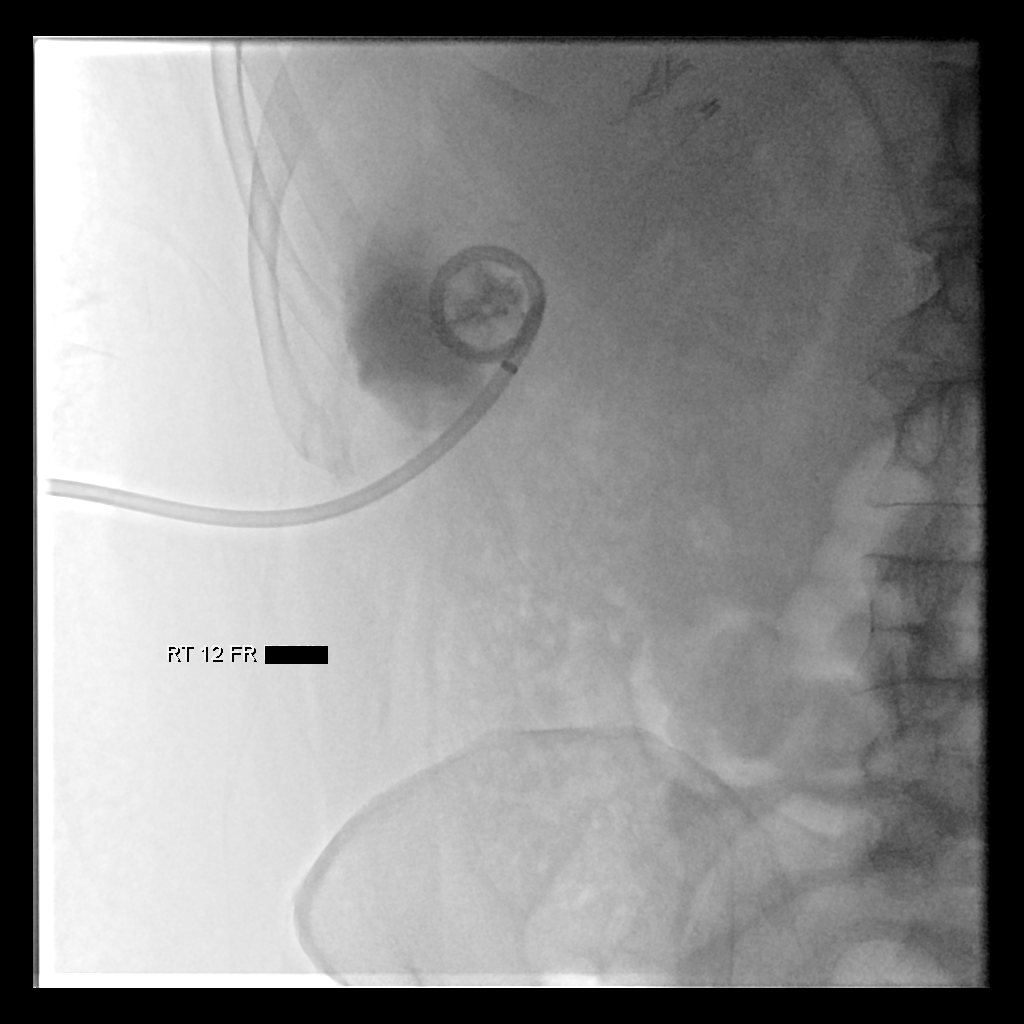

[3 of 3 positions shown; findings below may reference images not displayed]

EXAM:
DRAIN EXCHANGE

MEDICATIONS:
The patient is currently admitted to the hospital and receiving
intravenous antibiotics. The antibiotics were administered within an
appropriate time frame prior to the initiation of the procedure.

ANESTHESIA/SEDATION:
None.

COMPLICATIONS:
None immediate.

PROCEDURE:
Informed written consent was obtained from the patient after a
thorough discussion of the procedural risks, benefits and
alternatives. All questions were addressed. Maximal Sterile Barrier
Technique was utilized including caps, mask, sterile gowns, sterile
gloves, sterile drape, hand hygiene and skin antiseptic. A timeout
was performed prior to the initiation of the procedure.

The right lower quadrant was prepped and draped in a sterile
fashion. 1% lidocaine was utilized for local anesthesia. The 12
French drain was exchanged over a Bentson wire for a Kumpe catheter.
The catheter was retracted into the abscess. Contrast injected fills
the abscess. The Kumpe was then exchanged over Myrto Nihat for a 12
French drain. It was looped and string fixed. Contrast was injected.
Frank pus was aspirated.
FINDINGS: Images demonstrate exchange of the right lower quadrant 12 French
drain. The tip is now positioned in the abscess cavity.
IMPRESSION: Successful right lower quadrant abscess drain exchange. The tip is
now coiled in the abscess cavity. Frank pus was aspirated.

## 2018-09-02 IMAGING — CT CT ABD-PELV W/ CM
2 of 4 series · 15 of 46 positions shown, 17 images · IV contrast (isovue)
Comparison: 05/27/2017

CLINICAL DATA: Biloma drain

EXAM:
CT ABDOMEN AND PELVIS WITH CONTRAST
TECHNIQUE: Multidetector CT imaging of the abdomen and pelvis was performed
using the standard protocol following bolus administration of
intravenous contrast.
CONTRAST:  100 cc Isovue 300

[Series 2: abd pelvis 5.00 br40 s3 ax · axial · 0.74mm/px · z∈[+1285,+1705]mm · 12 of 98 slices shown, 14 images]
[im 7/98  soft-tissue]
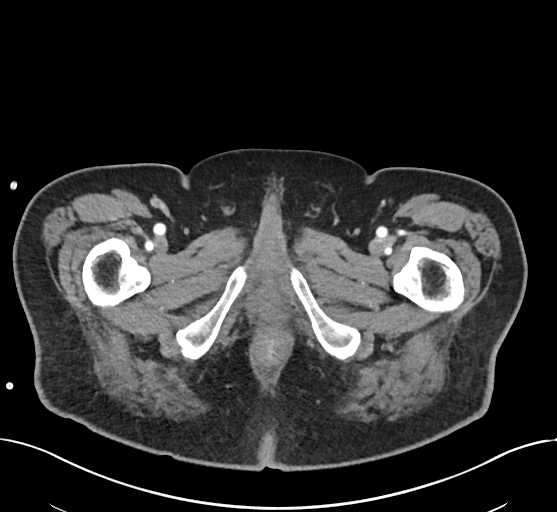
[im 7/98  bone]
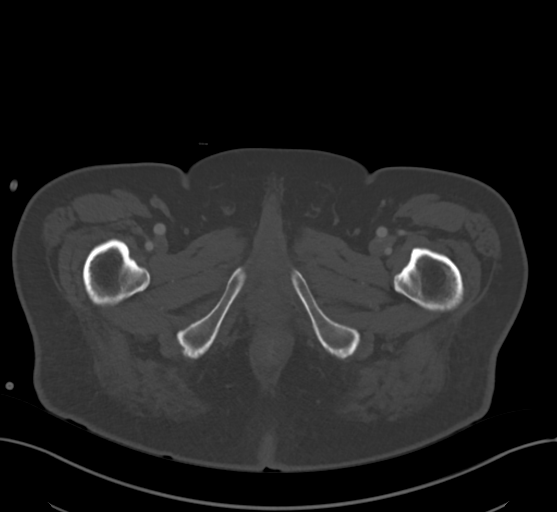
[im 13/98  soft-tissue]
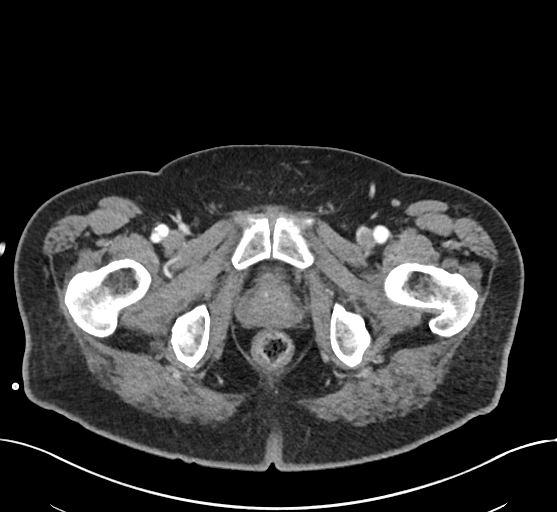
[im 20/98  soft-tissue]
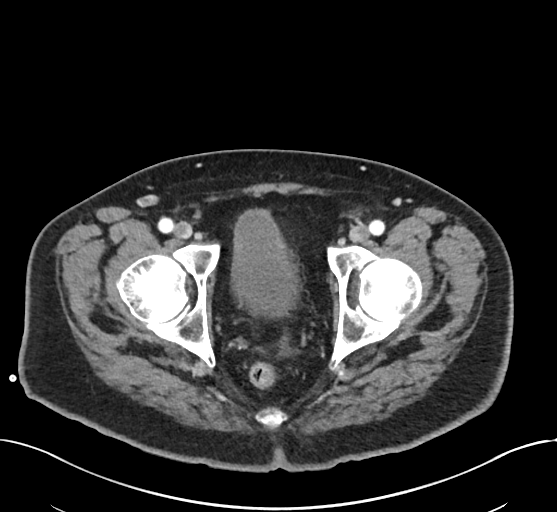
[im 33/98  soft-tissue]
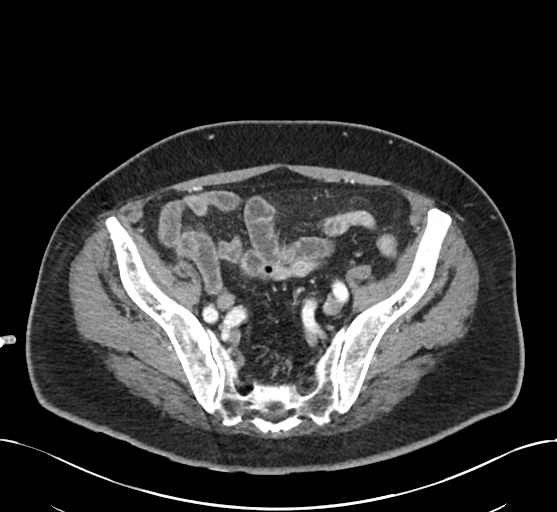
[im 39/98  soft-tissue]
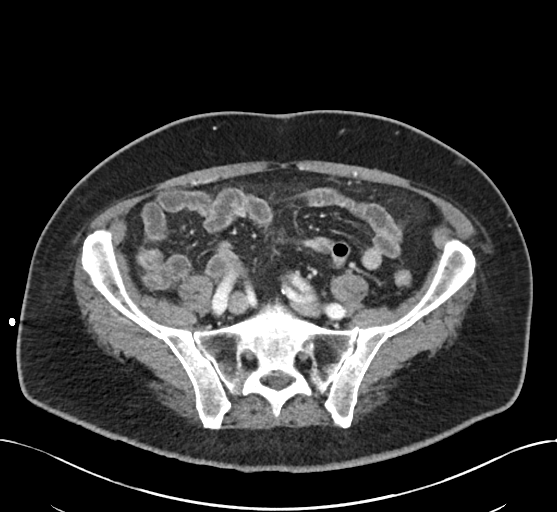
[im 46/98  soft-tissue]
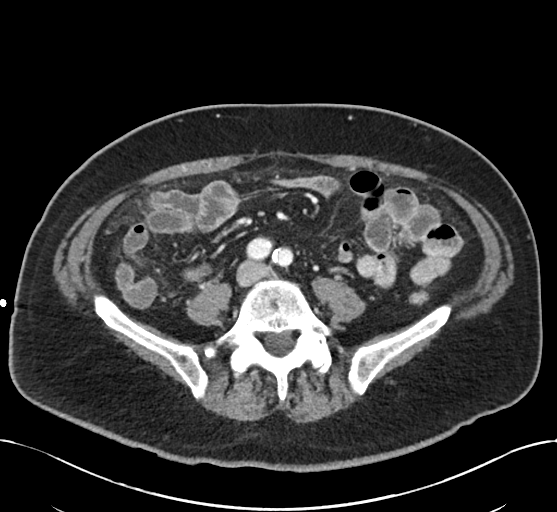
[im 52/98  soft-tissue]
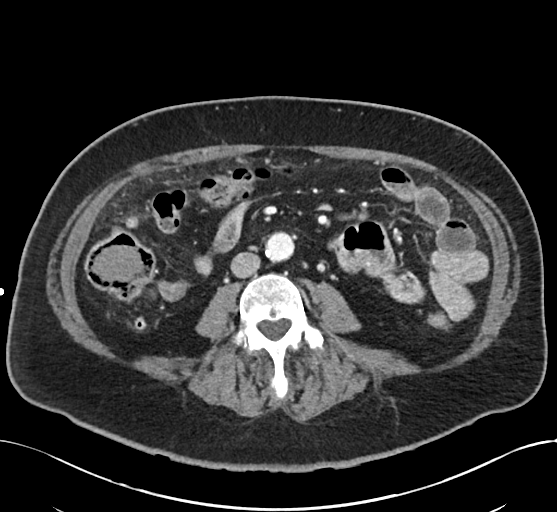
[im 59/98  soft-tissue]
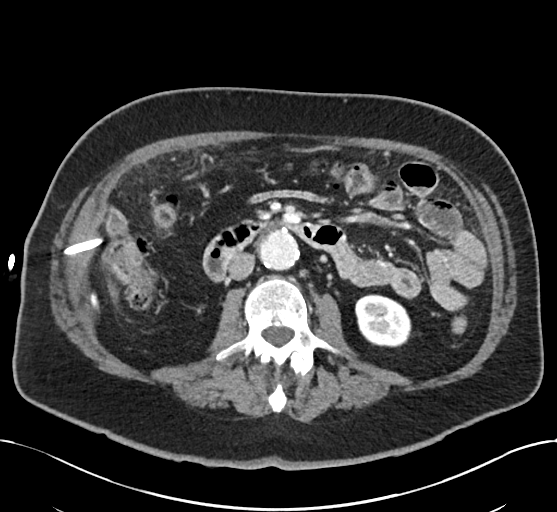
[im 65/98  soft-tissue]
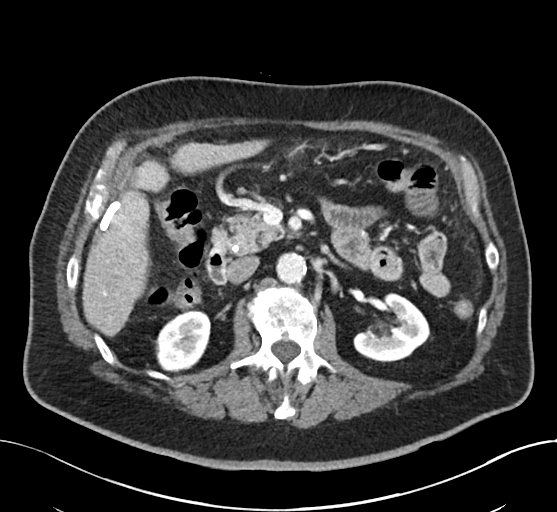
[im 65/98  bone]
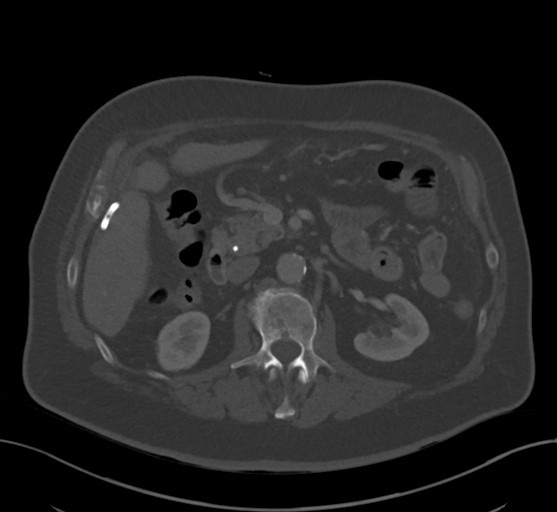
[im 78/98  soft-tissue]
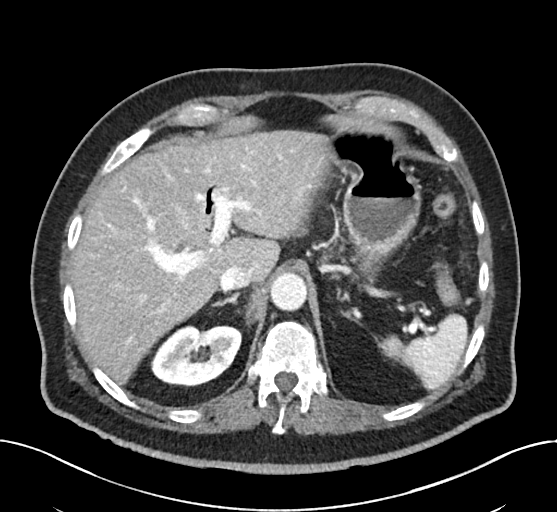
[im 85/98  soft-tissue]
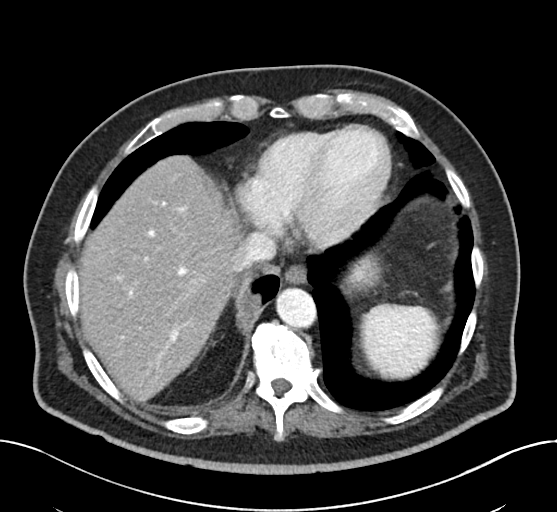
[im 91/98  soft-tissue]
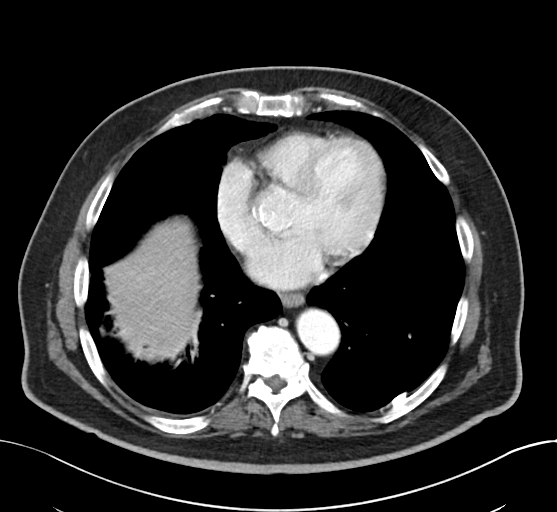

[Series 6: abd pelvis 2.00 br40 s3 cor · coronal · 0.79mm/px · 3 of 153 slices shown]
[im 51/153  soft-tissue]
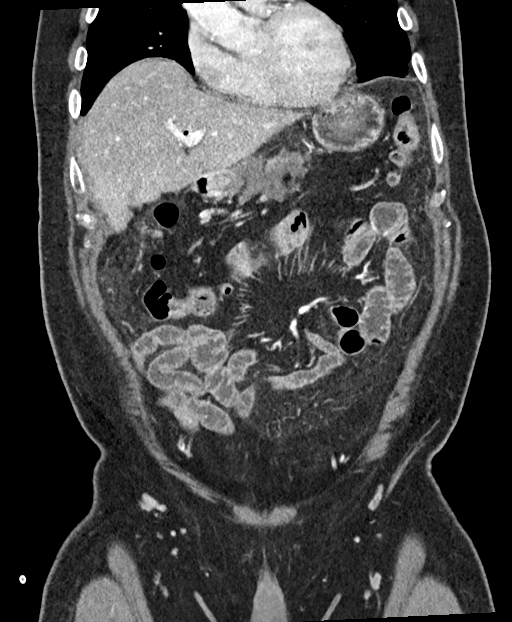
[im 68/153  soft-tissue]
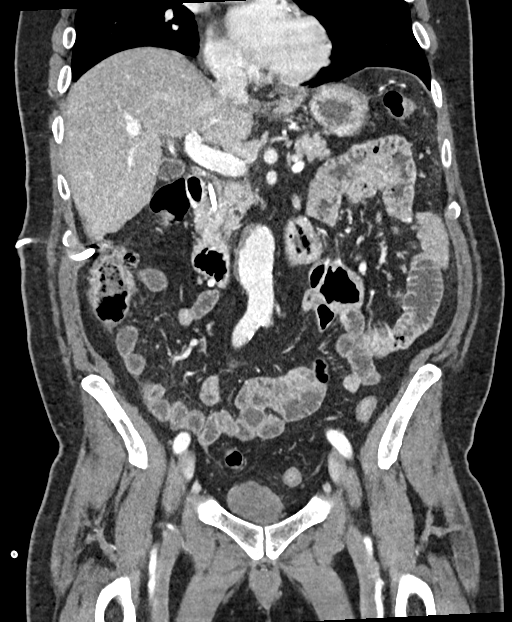
[im 85/153  soft-tissue]
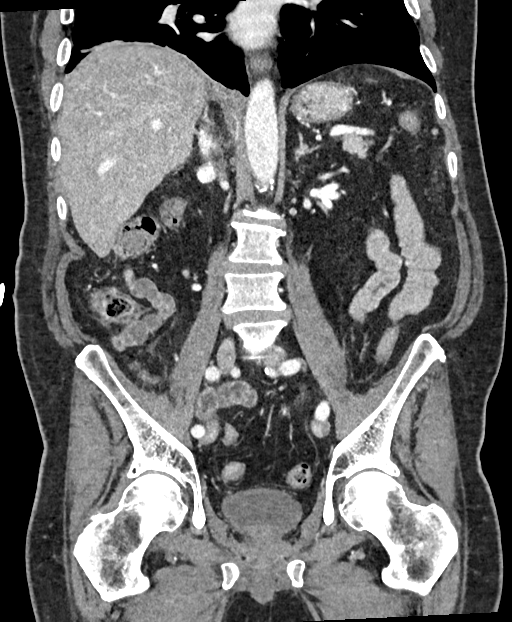

[15 of 46 positions shown; findings below may reference images not displayed]

FINDINGS: Lower chest: Subsegmental atelectasis at the right lung base. 5 mm
nodule in the lingula.

Hepatobiliary: Pneumobilia is present. A common bile duct stent is
in place. The drain is positioned anterior to the liver. The
perihepatic fluid collection anterior to the liver has resolved. A
tiny amount of free intraperitoneal gas likely from drain injection
is present anterior to the left lobe of the liver. A 2.7 cm fluid
collection in the gallbladder fossa is improved compared with 3.4 cm
in the prior study. Diffuse hepatic steatosis.

Pancreas: Pancreatic parenchyma is within normal limits. Fluid
density in the lesser sac, anterior to the pancreatic head, is
stable.

Spleen: Trace free-fluid lateral to the spleen is unchanged.

Adrenals/Urinary Tract: Kidneys are within normal limits. Adrenal
glands are within normal limits. Fluid density medial to the right
kidney is stable.

Stomach/Bowel: No evidence of small-bowel obstruction. No obvious
mass in the colon.

Vascular/Lymphatic: Atherosclerotic calcifications and some smooth
plaque in the infrarenal abdominal aorta is stable. Maximal diameter
of the abdominal aorta is 3.1 cm.

Reproductive: Prostate is unremarkable.

Other: There is a small amount of free fluid in the pelvis.
Stranding in the fat anterior to the transverse colon is improved.

Musculoskeletal: No vertebral compression deformity.
IMPRESSION: After improved positioning of the drain, the perihepatic fluid
collection has resolved.

A small gallbladder fossa fluid collection is improved.

Stable common bile duct stent.

Fluid collections anterior to the head of the pancreas and medial to
the right kidney are stable.

5 mm nodule in the lingula. No follow-up needed if patient is
low-risk. Non-contrast chest CT can be considered in 12 months if
patient is high-risk. This recommendation follows the consensus
statement: Guidelines for Management of Incidental Pulmonary Nodules
Detected on CT Images: From the [HOSPITAL] 4257; Radiology

## 2018-09-02 IMAGING — RF DG SINUS / FISTULA TRACT / ABSCESSOGRAM
4 series · 10 of 10 positions shown · non-contrast
Comparison: none

INDICATION: Drain injection

[Series 1: one shot · 1 of 1 slices shown (1 of 2)]
[im 1/1]
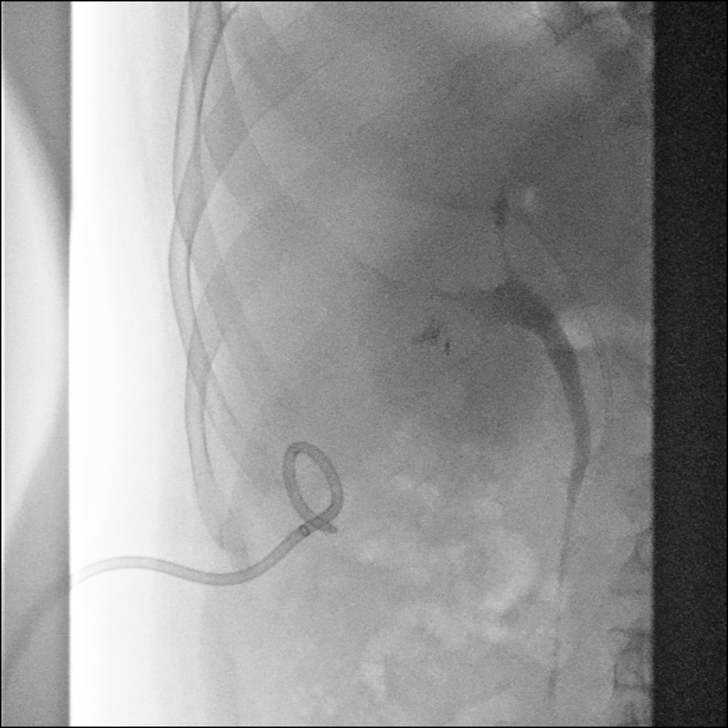

[Series 2: sequence · 4 of 51 frames shown (1 of 2)]
[frame 8/51]
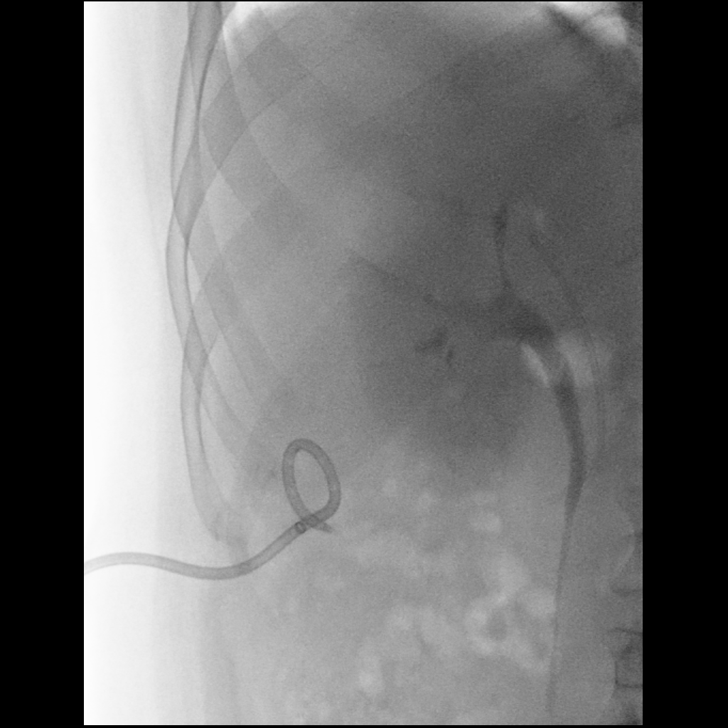
[frame 26/51]
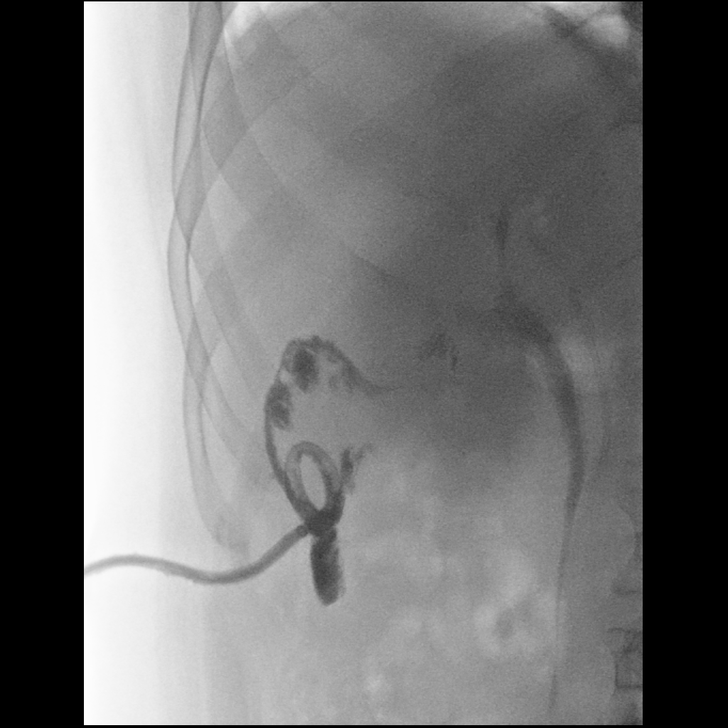
[frame 41/51]
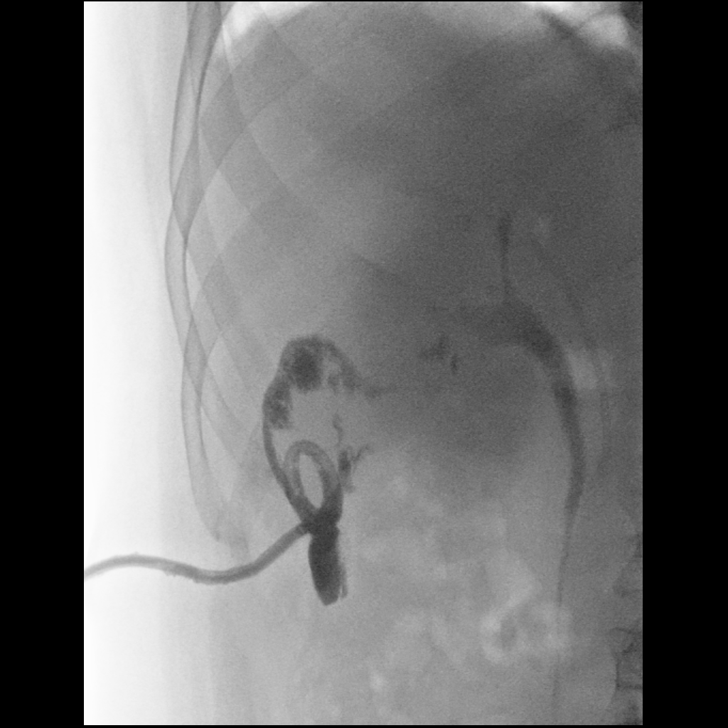
[frame 44/51]
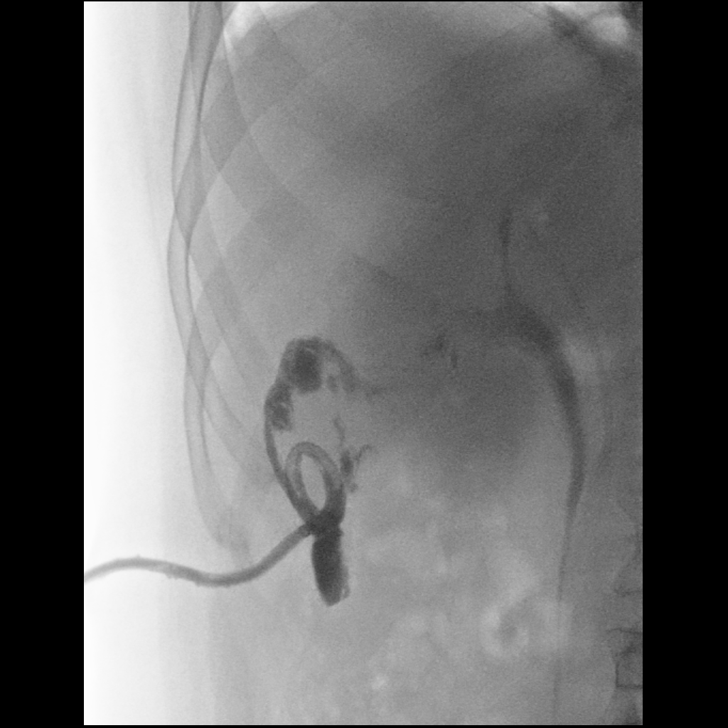

[Series 3: one shot · 1 of 1 slices shown (2 of 2)]
[im 1/1]
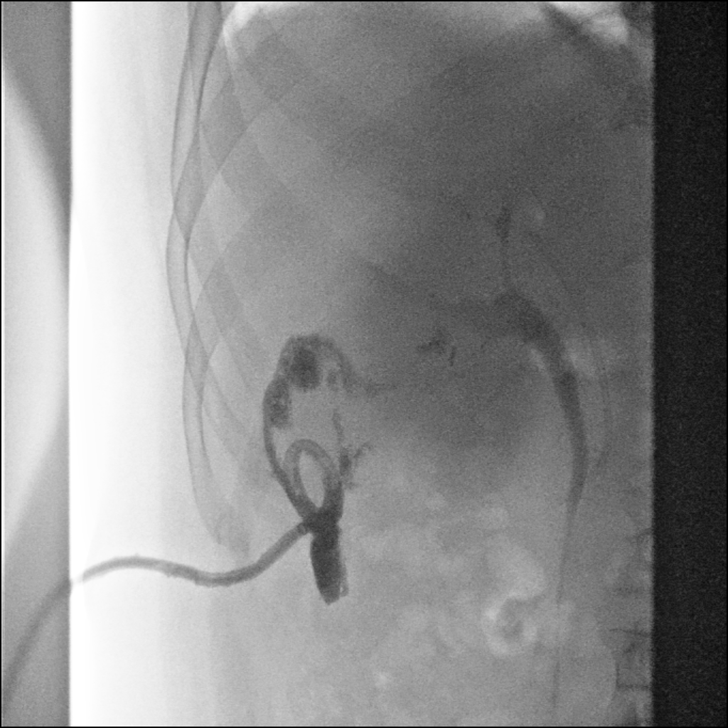

[Series 4: sequence · 4 of 5 frames shown (2 of 2)]
[frame 1/5]
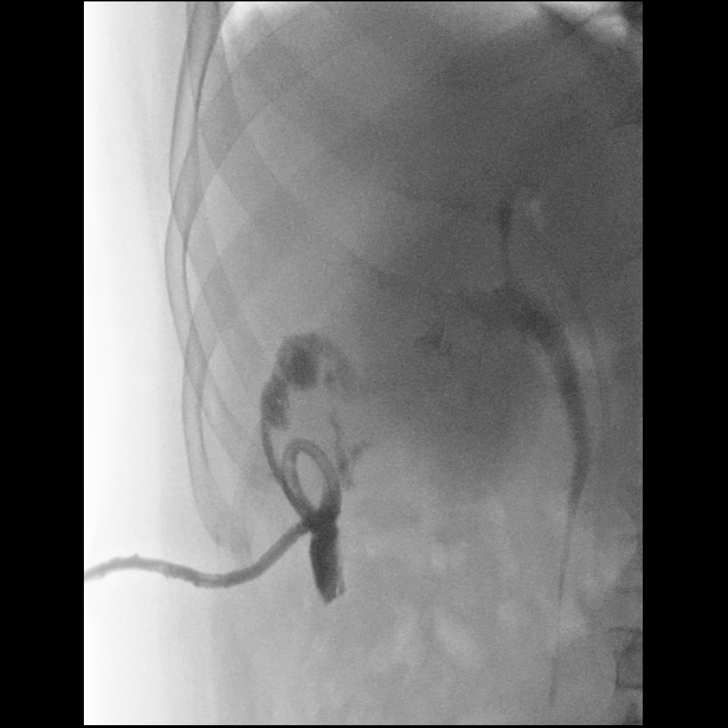
[frame 2/5]
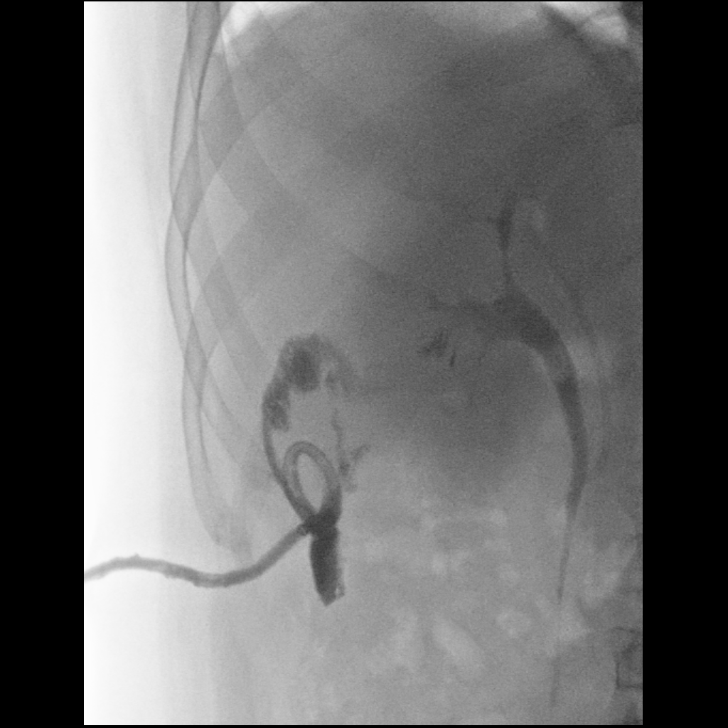
[frame 3/5]
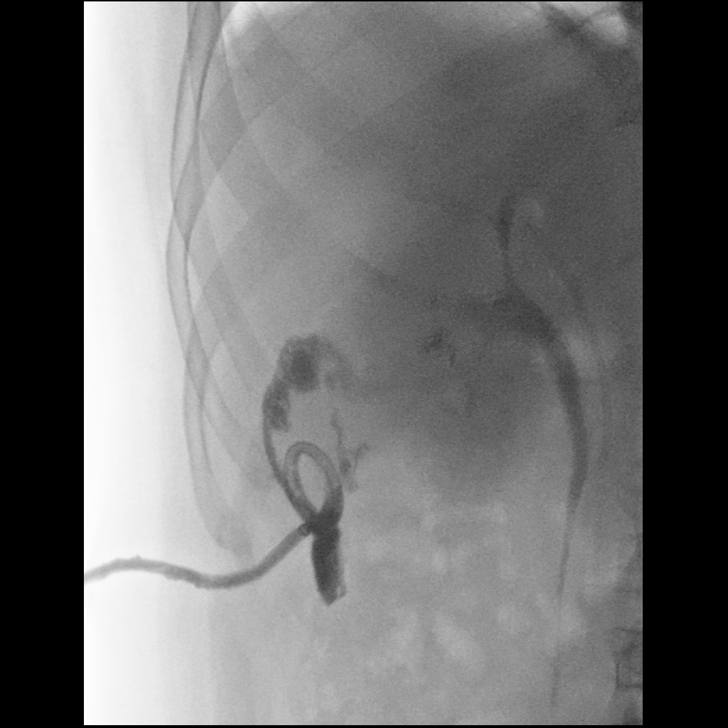
[frame 5/5]
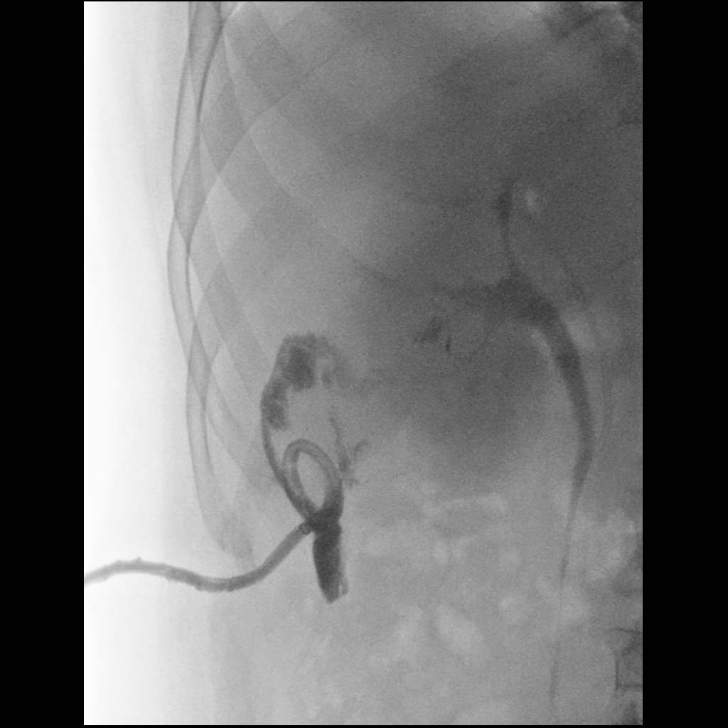

[10 of 10 positions shown; findings below may reference images not displayed]

EXAM:
DRAIN CHECK

MEDICATIONS:
The patient is currently admitted to the hospital and receiving
intravenous antibiotics. The antibiotics were administered within an
appropriate time frame prior to the initiation of the procedure.

ANESTHESIA/SEDATION:
None

COMPLICATIONS:
None immediate.

PROCEDURE:
Informed written consent was obtained from the patient after a
thorough discussion of the procedural risks, benefits and
alternatives. All questions were addressed. Maximal Sterile Barrier
Technique was utilized including caps, mask, sterile gowns, sterile
gloves, sterile drape, hand hygiene and skin antiseptic. A timeout
was performed prior to the initiation of the procedure.

Contrast was injected into the perihepatic drain and imaging was
obtained. It was then cut and removed without complication.
FINDINGS: Contrast injected about the drain fills a potential space in the
perihepatic region. The abscess cavity has largely resolved. Much of
the contrast traverses around the drain and extends to the skin.
There is no evidence of filling of the biliary tree. There is no
communication with the gallbladder fossa.
IMPRESSION: The abscess cavity has resolved and there is no evidence of
communication with the biliary tree or gallbladder fossa. The drain
was removed.

## 2019-09-04 ENCOUNTER — Observation Stay (HOSPITAL_BASED_OUTPATIENT_CLINIC_OR_DEPARTMENT_OTHER)
Admission: EM | Admit: 2019-09-04 | Discharge: 2019-09-06 | Disposition: A | Discharge status: Home / Self Care | Payer: Medicare HMO | Attending: General Surgery | Admitting: General Surgery

## 2019-09-04 ENCOUNTER — Emergency Department (HOSPITAL_COMMUNITY): Payer: Medicare HMO | Admitting: Anesthesiology

## 2019-09-04 ENCOUNTER — Other Ambulatory Visit: Payer: Self-pay

## 2019-09-04 ENCOUNTER — Encounter (HOSPITAL_BASED_OUTPATIENT_CLINIC_OR_DEPARTMENT_OTHER): Payer: Self-pay

## 2019-09-04 ENCOUNTER — Encounter (HOSPITAL_COMMUNITY)
Admission: EM | Disposition: A | Discharge status: Home / Self Care | Payer: Self-pay | Source: Home / Self Care | Attending: Emergency Medicine

## 2019-09-04 ENCOUNTER — Emergency Department (HOSPITAL_BASED_OUTPATIENT_CLINIC_OR_DEPARTMENT_OTHER): Payer: Medicare HMO

## 2019-09-04 DIAGNOSIS — Z20822 Contact with and (suspected) exposure to covid-19: Secondary | ICD-10-CM | POA: Insufficient documentation

## 2019-09-04 DIAGNOSIS — Z87891 Personal history of nicotine dependence: Secondary | ICD-10-CM | POA: Diagnosis not present

## 2019-09-04 DIAGNOSIS — E785 Hyperlipidemia, unspecified: Secondary | ICD-10-CM | POA: Diagnosis not present

## 2019-09-04 DIAGNOSIS — I714 Abdominal aortic aneurysm, without rupture: Secondary | ICD-10-CM | POA: Diagnosis not present

## 2019-09-04 DIAGNOSIS — N179 Acute kidney failure, unspecified: Secondary | ICD-10-CM | POA: Diagnosis not present

## 2019-09-04 DIAGNOSIS — I10 Essential (primary) hypertension: Secondary | ICD-10-CM | POA: Insufficient documentation

## 2019-09-04 DIAGNOSIS — Z882 Allergy status to sulfonamides status: Secondary | ICD-10-CM | POA: Insufficient documentation

## 2019-09-04 DIAGNOSIS — Z8249 Family history of ischemic heart disease and other diseases of the circulatory system: Secondary | ICD-10-CM | POA: Insufficient documentation

## 2019-09-04 DIAGNOSIS — K353 Acute appendicitis with localized peritonitis, without perforation or gangrene: Secondary | ICD-10-CM | POA: Diagnosis present

## 2019-09-04 DIAGNOSIS — K3580 Unspecified acute appendicitis: Secondary | ICD-10-CM | POA: Diagnosis present

## 2019-09-04 DIAGNOSIS — I700 Atherosclerosis of aorta: Secondary | ICD-10-CM | POA: Diagnosis not present

## 2019-09-04 DIAGNOSIS — Z9049 Acquired absence of other specified parts of digestive tract: Secondary | ICD-10-CM | POA: Diagnosis not present

## 2019-09-04 DIAGNOSIS — K37 Unspecified appendicitis: Secondary | ICD-10-CM | POA: Diagnosis present

## 2019-09-04 DIAGNOSIS — R7303 Prediabetes: Secondary | ICD-10-CM | POA: Diagnosis not present

## 2019-09-04 HISTORY — PX: LAPAROSCOPIC APPENDECTOMY: SHX408

## 2019-09-04 LAB — URINALYSIS, ROUTINE W REFLEX MICROSCOPIC
Glucose, UA: NEGATIVE mg/dL
Ketones, ur: 40 mg/dL — AB
Leukocytes,Ua: NEGATIVE
Nitrite: NEGATIVE
Protein, ur: 100 mg/dL — AB
Specific Gravity, Urine: >1.03 — ABNORMAL HIGH (ref 1.005–1.030)
pH: 5 (ref 5.0–8.0)

## 2019-09-04 LAB — TYPE AND SCREEN
ABO/RH(D): A POS
Antibody Screen: NEGATIVE

## 2019-09-04 LAB — COMPREHENSIVE METABOLIC PANEL
ALT: 21 U/L (ref 0–44)
AST: 23 U/L (ref 15–41)
Albumin: 3.9 g/dL (ref 3.5–5.0)
Alkaline Phosphatase: 55 U/L (ref 38–126)
Anion gap: 15 (ref 5–15)
BUN: 19 mg/dL (ref 8–23)
CO2: 19 mmol/L — ABNORMAL LOW (ref 22–32)
Calcium: 9.1 mg/dL (ref 8.9–10.3)
Chloride: 99 mmol/L (ref 98–111)
Creatinine, Ser: 1.13 mg/dL (ref 0.61–1.24)
GFR calc Af Amer: >60 mL/min (ref >60)
GFR calc non Af Amer: >60 mL/min (ref >60)
Glucose, Bld: 141 mg/dL — ABNORMAL HIGH (ref 70–99)
Potassium: 3.5 mmol/L (ref 3.5–5.1)
Sodium: 133 mmol/L — ABNORMAL LOW (ref 135–145)
Total Bilirubin: 1.5 mg/dL — ABNORMAL HIGH (ref 0.3–1.2)
Total Protein: 8.1 g/dL (ref 6.5–8.1)

## 2019-09-04 LAB — URINALYSIS, MICROSCOPIC (REFLEX)

## 2019-09-04 LAB — SARS CORONAVIRUS 2 BY RT PCR (HOSPITAL ORDER, PERFORMED IN ~~LOC~~ HOSPITAL LAB): SARS Coronavirus 2: NEGATIVE

## 2019-09-04 LAB — CBC
HCT: 45.6 % (ref 39.0–52.0)
Hemoglobin: 14.7 g/dL (ref 13.0–17.0)
MCH: 28.5 pg (ref 26.0–34.0)
MCHC: 32.2 g/dL (ref 30.0–36.0)
MCV: 88.5 fL (ref 80.0–100.0)
Platelets: 304 10*3/uL (ref 150–400)
RBC: 5.15 MIL/uL (ref 4.22–5.81)
RDW: 13.4 % (ref 11.5–15.5)
WBC: 11.1 10*3/uL — ABNORMAL HIGH (ref 4.0–10.5)
nRBC: 0 % (ref 0.0–0.2)

## 2019-09-04 LAB — ABO/RH: ABO/RH(D): A POS

## 2019-09-04 LAB — LIPASE, BLOOD: Lipase: 22 U/L (ref 11–51)

## 2019-09-04 SURGERY — APPENDECTOMY, LAPAROSCOPIC
Anesthesia: General | Site: Abdomen

## 2019-09-04 MED ORDER — DEXAMETHASONE SODIUM PHOSPHATE 10 MG/ML IJ SOLN
INTRAMUSCULAR | Status: DC | PRN
  Administered 2019-09-04: 10 mg via INTRAVENOUS

## 2019-09-04 MED ORDER — SODIUM CHLORIDE 0.9 % IV BOLUS
1000.0000 mL | Freq: Once | INTRAVENOUS | Status: AC
  Administered 2019-09-04: 1000 mL via INTRAVENOUS

## 2019-09-04 MED ORDER — FENTANYL CITRATE (PF) 250 MCG/5ML IJ SOLN
INTRAMUSCULAR | Status: AC
  Filled 2019-09-04: qty 5

## 2019-09-04 MED ORDER — SODIUM CHLORIDE 0.9 % IV SOLN
INTRAVENOUS | Status: DC | PRN
  Administered 2019-09-04: 500 mL via INTRAVENOUS

## 2019-09-04 MED ORDER — LIDOCAINE 2% (20 MG/ML) 5 ML SYRINGE
INTRAMUSCULAR | Status: DC | PRN
  Administered 2019-09-04: 60 mg via INTRAVENOUS

## 2019-09-04 MED ORDER — ROCURONIUM BROMIDE 10 MG/ML (PF) SYRINGE
PREFILLED_SYRINGE | INTRAVENOUS | Status: AC
  Filled 2019-09-04: qty 10

## 2019-09-04 MED ORDER — LACTATED RINGERS IV SOLN: INTRAVENOUS | Status: DC | PRN

## 2019-09-04 MED ORDER — SUCCINYLCHOLINE CHLORIDE 200 MG/10ML IV SOSY
PREFILLED_SYRINGE | INTRAVENOUS | Status: DC | PRN
  Administered 2019-09-04: 200 mg via INTRAVENOUS

## 2019-09-04 MED ORDER — PROPOFOL 10 MG/ML IV BOLUS
INTRAVENOUS | Status: DC | PRN
  Administered 2019-09-04: 130 mg via INTRAVENOUS
  Administered 2019-09-05: 20 mg via INTRAVENOUS

## 2019-09-04 MED ORDER — IOHEXOL 300 MG/ML  SOLN
100.0000 mL | Freq: Once | INTRAMUSCULAR | Status: AC | PRN
  Administered 2019-09-04: 100 mL via INTRAVENOUS

## 2019-09-04 MED ORDER — LIDOCAINE 2% (20 MG/ML) 5 ML SYRINGE
INTRAMUSCULAR | Status: AC
  Filled 2019-09-04: qty 5

## 2019-09-04 MED ORDER — 0.9 % SODIUM CHLORIDE (POUR BTL) OPTIME
TOPICAL | Status: DC | PRN
  Administered 2019-09-04: 1000 mL

## 2019-09-04 MED ORDER — PIPERACILLIN-TAZOBACTAM 3.375 G IVPB 30 MIN
3.3750 g | Freq: Once | INTRAVENOUS | Status: AC
  Administered 2019-09-04: 3.375 g via INTRAVENOUS
  Filled 2019-09-04 (×2): qty 50

## 2019-09-04 MED ORDER — FENTANYL CITRATE (PF) 250 MCG/5ML IJ SOLN
INTRAMUSCULAR | Status: DC | PRN
  Administered 2019-09-04 (×2): 50 ug via INTRAVENOUS
  Administered 2019-09-04: 100 ug via INTRAVENOUS
  Administered 2019-09-04 – 2019-09-05 (×3): 50 ug via INTRAVENOUS

## 2019-09-04 MED ORDER — PHENYLEPHRINE 40 MCG/ML (10ML) SYRINGE FOR IV PUSH (FOR BLOOD PRESSURE SUPPORT)
PREFILLED_SYRINGE | INTRAVENOUS | Status: AC
  Filled 2019-09-04: qty 10

## 2019-09-04 MED ORDER — DEXAMETHASONE SODIUM PHOSPHATE 10 MG/ML IJ SOLN
INTRAMUSCULAR | Status: AC
  Filled 2019-09-04: qty 1

## 2019-09-04 MED ORDER — ONDANSETRON HCL 4 MG/2ML IJ SOLN
INTRAMUSCULAR | Status: AC
  Filled 2019-09-04: qty 2

## 2019-09-04 MED ORDER — SODIUM CHLORIDE 0.9% FLUSH
3.0000 mL | Freq: Once | INTRAVENOUS | Status: AC
  Administered 2019-09-04: 3 mL via INTRAVENOUS
  Filled 2019-09-04: qty 3

## 2019-09-04 MED ORDER — SUCCINYLCHOLINE CHLORIDE 200 MG/10ML IV SOSY
PREFILLED_SYRINGE | INTRAVENOUS | Status: AC
  Filled 2019-09-04: qty 10

## 2019-09-04 MED ORDER — BUPIVACAINE-EPINEPHRINE (PF) 0.25% -1:200000 IJ SOLN
INTRAMUSCULAR | Status: AC
  Filled 2019-09-04: qty 30

## 2019-09-04 MED ORDER — PROPOFOL 10 MG/ML IV BOLUS
INTRAVENOUS | Status: AC
  Filled 2019-09-04: qty 20

## 2019-09-04 MED ORDER — ROCURONIUM BROMIDE 10 MG/ML (PF) SYRINGE
PREFILLED_SYRINGE | INTRAVENOUS | Status: DC | PRN
  Administered 2019-09-04: 10 mg via INTRAVENOUS
  Administered 2019-09-04: 40 mg via INTRAVENOUS
  Administered 2019-09-04: 20 mg via INTRAVENOUS

## 2019-09-04 MED ORDER — LACTATED RINGERS IR SOLN
Status: DC | PRN
  Administered 2019-09-04: 1000 mL

## 2019-09-04 SURGICAL SUPPLY — 37 items
APPLIER CLIP ROT 10 11.4 M/L (STAPLE)
CABLE HIGH FREQUENCY MONO STRZ (ELECTRODE) ×2 IMPLANT
CHLORAPREP W/TINT 26 (MISCELLANEOUS) ×2 IMPLANT
CLIP APPLIE ROT 10 11.4 M/L (STAPLE) IMPLANT
CUTTER FLEX LINEAR 45M (STAPLE) ×2 IMPLANT
DECANTER SPIKE VIAL GLASS SM (MISCELLANEOUS) ×2 IMPLANT
DERMABOND ADVANCED (GAUZE/BANDAGES/DRESSINGS) ×1
DERMABOND ADVANCED .7 DNX12 (GAUZE/BANDAGES/DRESSINGS) ×1 IMPLANT
DRAIN CHANNEL 19F RND (DRAIN) ×2 IMPLANT
DRSG TEGADERM 4X4.75 (GAUZE/BANDAGES/DRESSINGS) ×2 IMPLANT
ELECT REM PT RETURN 15FT ADLT (MISCELLANEOUS) ×2 IMPLANT
EVACUATOR SILICONE 100CC (DRAIN) ×2 IMPLANT
GLOVE BIO SURGEON STRL SZ7.5 (GLOVE) ×2 IMPLANT
GLOVE BIOGEL PI IND STRL 7.0 (GLOVE) ×3 IMPLANT
GLOVE BIOGEL PI INDICATOR 7.0 (GLOVE) ×3
GOWN STRL REUS W/TWL XL LVL3 (GOWN DISPOSABLE) ×4 IMPLANT
KIT BASIN (CUSTOM PROCEDURE TRAY) ×2 IMPLANT
KIT TURNOVER KIT A (KITS) ×2 IMPLANT
POUCH SPECIMEN RETRIEVAL 10MM (ENDOMECHANICALS) ×2 IMPLANT
RELOAD 45 VASCULAR/THIN (ENDOMECHANICALS) IMPLANT
RELOAD EGIA 60 MED/THCK PURPLE (STAPLE) IMPLANT
RELOAD STAPLE TA45 3.5 REG BLU (ENDOMECHANICALS) IMPLANT
RELOAD STAPLER 60MM BLK (STAPLE) ×1 IMPLANT
SCISSORS LAP 5X35 DISP (ENDOMECHANICALS) ×2 IMPLANT
SET IRRIG TUBING LAPAROSCOPIC (IRRIGATION / IRRIGATOR) ×2 IMPLANT
SET TUBE SMOKE EVAC HIGH FLOW (TUBING) ×2 IMPLANT
SHEARS HARMONIC ACE PLUS 36CM (ENDOMECHANICALS) ×2 IMPLANT
SPONGE DRAIN TRACH 4X4 STRL 2S (GAUZE/BANDAGES/DRESSINGS) ×2 IMPLANT
STAPLE ECHEON FLEX 60 POW ENDO (STAPLE) ×2 IMPLANT
STAPLER RELOAD 60MM BLK (STAPLE) ×2
SUT MNCRL AB 4-0 PS2 18 (SUTURE) ×2 IMPLANT
SUT SILK 2 0 SH (SUTURE) ×2 IMPLANT
TOWEL OR 17X26 10 PK STRL BLUE (TOWEL DISPOSABLE) ×2 IMPLANT
TRAY FOLEY MTR SLVR 16FR STAT (SET/KITS/TRAYS/PACK) ×2 IMPLANT
TRAY LAPAROSCOPIC (CUSTOM PROCEDURE TRAY) ×2 IMPLANT
TROCAR BLADELESS OPT 5 100 (ENDOMECHANICALS) ×2 IMPLANT
TROCAR XCEL BLUNT TIP 100MML (ENDOMECHANICALS) ×2 IMPLANT

## 2019-09-04 NOTE — Anesthesia Preprocedure Evaluation (Addendum)
Anesthesia Evaluation  Patient identified by MRN, date of birth, ID band Patient awake    Reviewed: Allergy & Precautions, NPO status , Patient's Chart, lab work & pertinent test results, reviewed documented beta blocker date and time   Airway Mallampati: II  TM Distance: >3 FB Neck ROM: Full    Dental no notable dental hx. (+) Edentulous Upper, Poor Dentition,    Pulmonary neg pulmonary ROS, former smoker,    Pulmonary exam normal breath sounds clear to auscultation       Cardiovascular hypertension, Pt. on home beta blockers and Pt. on medications Normal cardiovascular exam Rhythm:Regular Rate:Normal  HLD  infrarenal abdominal aortic aneurysm measuring up to 3.5 cm   Neuro/Psych negative neurological ROS  negative psych ROS   GI/Hepatic negative GI ROS, Neg liver ROS,   Endo/Other  negative endocrine ROS  Renal/GU negative Renal ROS  negative genitourinary   Musculoskeletal negative musculoskeletal ROS (+)   Abdominal   Peds  Hematology negative hematology ROS (+)   Anesthesia Other Findings   Reproductive/Obstetrics                            Anesthesia Physical Anesthesia Plan  ASA: II and emergent  Anesthesia Plan: General   Post-op Pain Management:    Induction: Intravenous and Rapid sequence  PONV Risk Score and Plan: 2 and Dexamethasone, Ondansetron and Treatment may vary due to age or medical condition  Airway Management Planned: Oral ETT  Additional Equipment:   Intra-op Plan:   Post-operative Plan: Extubation in OR  Informed Consent: I have reviewed the patients History and Physical, chart, labs and discussed the procedure including the risks, benefits and alternatives for the proposed anesthesia with the patient or authorized representative who has indicated his/her understanding and acceptance.     Dental advisory given  Plan Discussed with:  CRNA  Anesthesia Plan Comments:         Anesthesia Quick Evaluation

## 2019-09-04 NOTE — H&P (Signed)
Nathan Baxter is an 74 y.o. male.   Chief Complaint: Abdominal pain HPI: The patient is a 74 year old white male who began having abdominal pain on Tuesday.  The pain has gradually worsened since then.  The pain is been associated with some nausea but no vomiting.  He has had several episodes of diarrhea.  He has had diaphoresis but no recorded fever.  He went to the emergency department where a CT scan was suggestive of acute appendicitis but no evidence of rupture or abscess.  He does have a history of laparoscopic cholecystectomy complicated by bile leak requiring drains 2 years ago  Past Medical History:  Diagnosis Date   Acute biliary pancreatitis    Aneurysm of infrarenal abdominal aorta (HCC)    Cholecystitis    HLD (hyperlipidemia)    HTN (hypertension)    Prediabetes     Past Surgical History:  Procedure Laterality Date   CHOLECYSTECTOMY N/A 05/06/2017   Procedure: LAPAROSCOPIC CHOLECYSTECTOMY;  Surgeon: Almond Lint, MD;  Location: [facility];  Service: General;  Laterality: N/A;   ERCP N/A 05/23/2017   Procedure: ENDOSCOPIC RETROGRADE CHOLANGIOPANCREATOGRAPHY (ERCP);  Surgeon: Sherrilyn Rist, MD;  Location: [facility] ENDOSCOPY;  Service: Gastroenterology;  Laterality: N/A;   ERCP N/A 07/08/2017   Procedure: ENDOSCOPIC RETROGRADE CHOLANGIOPANCREATOGRAPHY (ERCP);  Surgeon: Sherrilyn Rist, MD;  Location: [facility] ENDOSCOPY;  Service: Gastroenterology;  Laterality: N/A;   IR CATHETER TUBE CHANGE  05/29/2017   IR RADIOLOGIST EVAL & MGMT  06/05/2017    Family History  Problem Relation Age of Onset   Breast cancer Mother    Hypertension Father    Hypertension Sister    Social History:  reports that he has quit smoking. He has never used smokeless tobacco. He reports current alcohol use. He reports that he does not use drugs.  Allergies:  Allergies  Allergen Reactions   Sulfa Antibiotics Rash    (Not in a hospital admission)   Results for orders placed or performed during  the hospital encounter of 09/04/19 (from the past 48 hour(s))  Lipase, blood     Status: None   Collection Time: 09/04/19  2:33 PM  Result Value Ref Range   Lipase 22 11 - 51 U/L    Comment: Performed at [facility], [address] [place], [place] [address]  Comprehensive metabolic panel     Status: Abnormal   Collection Time: 09/04/19  2:33 PM  Result Value Ref Range   Sodium 133 (L) 135 - 145 mmol/L   Potassium 3.5 3.5 - 5.1 mmol/L   Chloride 99 98 - 111 mmol/L   CO2 19 (L) 22 - 32 mmol/L   Glucose, Bld 141 (H) 70 - 99 mg/dL    Comment: Glucose reference range applies only to samples taken after fasting for at least 8 hours.   BUN 19 8 - 23 mg/dL   Creatinine, Ser 9.93 0.61 - 1.24 mg/dL   Calcium 9.1 8.9 - 71.6 mg/dL   Total Protein 8.1 6.5 - 8.1 g/dL   Albumin 3.9 3.5 - 5.0 g/dL   AST 23 15 - 41 U/L   ALT 21 0 - 44 U/L   Alkaline Phosphatase 55 38 - 126 U/L   Total Bilirubin 1.5 (H) 0.3 - 1.2 mg/dL   GFR calc non Af Amer >60 >60 mL/min   GFR calc Af Amer >60 >60 mL/min   Anion gap 15 5 - 15    Comment: Performed at [facility]  [address], [address] [place], [place] [address]  CBC     Status: Abnormal   Collection Time: 09/04/19  2:33 PM  Result Value Ref Range   WBC 11.1 (H) 4.0 - 10.5 K/uL   RBC 5.15 4.22 - 5.81 MIL/uL   Hemoglobin 14.7 13.0 - 17.0 g/dL   HCT 23.5 39 - 52 %   MCV 88.5 80.0 - 100.0 fL   MCH 28.5 26.0 - 34.0 pg   MCHC 32.2 30.0 - 36.0 g/dL   RDW 36.1 44.3 - 15.4 %   Platelets 304 150 - 400 K/uL   nRBC 0.0 0.0 - 0.2 %    Comment: Performed at [facility], [address] [place], [place] [address]  Urinalysis, Routine w reflex microscopic     Status: Abnormal   Collection Time: 09/04/19  2:33 PM  Result Value Ref Range   Color, Urine ORANGE (A) YELLOW    Comment: BIOCHEMICALS MAY BE AFFECTED BY COLOR   APPearance CLOUDY (A) CLEAR   Specific Gravity, Urine >1.030 (H) 1.005 - 1.030   pH 5.0 5.0 - 8.0   Glucose, UA  NEGATIVE NEGATIVE mg/dL   Hgb urine dipstick TRACE (A) NEGATIVE   Bilirubin Urine MODERATE (A) NEGATIVE   Ketones, ur 40 (A) NEGATIVE mg/dL   Protein, ur 619 (A) NEGATIVE mg/dL   Nitrite NEGATIVE NEGATIVE   Leukocytes,Ua NEGATIVE NEGATIVE    Comment: Performed at [facility], [address] [place], [place] [address]  Urinalysis, Microscopic (reflex)     Status: Abnormal   Collection Time: 09/04/19  2:33 PM  Result Value Ref Range   RBC / HPF 0-5 0 - 5 RBC/hpf   WBC, UA 0-5 0 - 5 WBC/hpf   Bacteria, UA MANY (A) NONE SEEN   Squamous Epithelial / LPF 0-5 0 - 5   Mucus PRESENT    Hyaline Casts, UA PRESENT    Granular Casts, UA PRESENT    WBC Casts, UA PRESENT    Ca Oxalate Crys, UA PRESENT    Urine-Other LESS THAN 10 mL OF URINE SUBMITTED     Comment: Performed at [facility], [address] [place], [place] [address]  SARS Coronavirus 2 by RT PCR (hospital order, performed in [facility] hospital lab) Nasopharyngeal Nasopharyngeal Swab     Status: None   Collection Time: 09/04/19  5:37 PM   Specimen: Nasopharyngeal Swab  Result Value Ref Range   SARS Coronavirus 2 NEGATIVE NEGATIVE    Comment: (NOTE) SARS-CoV-2 target nucleic acids are NOT DETECTED.  The SARS-CoV-2 RNA is generally detectable in upper and lower respiratory specimens during the acute phase of infection. The lowest concentration of SARS-CoV-2 viral copies this assay can detect is 250 copies / mL. A negative result does not preclude SARS-CoV-2 infection and should not be used as the sole basis for treatment or other patient management decisions.  A negative result may occur with improper specimen collection / handling, submission of specimen other than nasopharyngeal swab, presence of viral mutation(s) within the areas targeted by this assay, and inadequate number of viral copies (<250 copies / mL). A negative result must be combined with clinical observations, patient history, and  epidemiological information.  Fact Sheet for Patients:   BoilerBrush.com.cy  Fact Sheet for Healthcare Providers: https://pope.com/  This test is not yet approved or  cleared by the [place] FDA and has been authorized for detection and/or diagnosis of SARS-CoV-2 by  FDA under an Emergency Use Authorization (EUA).  This EUA will remain in effect (meaning this test can be used) for the duration of the COVID-19 declaration under Section 564(b)(1) of the Act, 21 U.S.C. section 360bbb-3(b)(1), unless the authorization is terminated or revoked sooner.  Performed at [facility], [address] [place], [place] [address]   Type and screen [facility]     Status: None (Preliminary result)   Collection Time: 09/04/19  7:55 PM  Result Value Ref Range   ABO/RH(D) PENDING    Antibody Screen PENDING    Sample Expiration      09/07/2019,2359 Performed at [facility], [address] [place], [place] [address]    CT ABDOMEN PELVIS W CONTRAST  Result Date: 09/04/2019 CLINICAL DATA:  Right-sided abdominal pain with bloating and diarrhea. EXAM: CT ABDOMEN AND PELVIS WITH CONTRAST TECHNIQUE: Multidetector CT imaging of the abdomen and pelvis was performed using the standard protocol following bolus administration of intravenous contrast. CONTRAST:  OMNIPAQUE IOHEXOL 300 MG/ML  SOLN COMPARISON:  CT abdomen pelvis dated June 05, 2017. FINDINGS: Lower chest: No acute abnormality. Hepatobiliary: No focal liver abnormality is seen. Status post cholecystectomy. Unchanged 3.5 cm thin walled fluid collection in the gallbladder fossa which may be a seroma or dilated cystic duct remnant. No biliary dilatation. Pancreas: Unremarkable. No pancreatic ductal dilatation or surrounding inflammatory changes. Spleen: Normal in size without focal abnormality. Adrenals/Urinary Tract: Adrenal glands are unremarkable.  Kidneys are normal, without renal calculi, focal lesion, or hydronephrosis. Bladder is unremarkable. Stomach/Bowel: Dilated appendix with surrounding inflammatory changes, consistent acute appendicitis. Appendix: Location: Retrocecal Diameter: 9 mm Appendicolith: None Mucosal hyper-enhancement: None Extraluminal gas: None Periappendiceal collection: Small amount of periappendiceal fluid at the base of the appendix without discrete fluid collection. Stomach is within normal limits. There are several mildly dilated loops of jejunum with gradual transition to nondilated small bowel. Vascular/Lymphatic: Increasing infrarenal abdominal aortic aneurysm measuring up to 3.5 cm, previously 3.1 cm. Aortic atherosclerosis. No enlarged abdominal or pelvic lymph nodes. Reproductive: Prostate is unremarkable. Other: No free fluid or pneumoperitoneum. Musculoskeletal: No acute or significant osseous findings. IMPRESSION: 1. Acute appendicitis. No perforation or abscess. 2. Several mildly dilated loops of jejunum with gradual transition to nondilated small bowel, suggestive of ileus. 3. Increasing infrarenal abdominal aortic aneurysm measuring up to 3.5 cm, previously 3.1 cm. Recommend followup by ultrasound in 2 years. This recommendation follows ACR consensus guidelines: White Paper of the ACR Incidental Findings Committee II on Vascular Findings. J Am Coll Radiol 2013; 10:789-794. Aortic aneurysm NOS (ICD10-I71.9) 4. Aortic Atherosclerosis (ICD10-I70.0). Electronically Signed   By: Obie Dredge M.D.   On: 09/04/2019 17:15    Review of Systems  Constitutional: Positive for diaphoresis.  HENT: Negative.   Eyes: Negative.   Respiratory: Negative.   Cardiovascular: Negative.   Gastrointestinal: Positive for abdominal pain, diarrhea and nausea.  Endocrine: Negative.   Genitourinary: Negative.   Musculoskeletal: Negative.   Skin: Negative.   Allergic/Immunologic: Negative.   Neurological: Negative.   Hematological:  Negative.   Psychiatric/Behavioral: Negative.     Blood pressure (!) 150/91, pulse 66, temperature 98.1 F (36.7 C), resp. rate 16, height 6' (1.829 m), weight 99.3 kg, SpO2 99 %. Physical Exam Constitutional:      General: He is not in acute distress.    Appearance: He is well-developed. He is not ill-appearing.  HENT:     Head: Normocephalic and atraumatic.     Comments: Ears  and nose normal Eyes:     General: No scleral icterus.    Extraocular Movements: Extraocular movements intact.     Pupils: Pupils are equal, round, and reactive to light.  Cardiovascular:     Rate and Rhythm: Normal rate and regular rhythm.     Heart sounds: Normal heart sounds.  Pulmonary:     Effort: Pulmonary effort is normal. No respiratory distress.     Breath sounds: Normal breath sounds.  Abdominal:     General: Abdomen is protuberant. Bowel sounds are normal.     Palpations: Abdomen is soft. There is no mass.     Tenderness: There is abdominal tenderness in the right lower quadrant. There is no guarding.     Hernia: No hernia is present.  Lymphadenopathy:     Comments: No groin or cervical lymphadenopathy  Skin:    General: Skin is warm and dry.     Coloration: Skin is not jaundiced.     Findings: No rash.  Neurological:     General: No focal deficit present.     Mental Status: He is alert and oriented to person, place, and time.  Psychiatric:        Mood and Affect: Mood normal.        Behavior: Behavior normal.      Assessment/Plan The patient appears to have acute appendicitis.  Because of the risk of perforation and sepsis I think he would benefit from having his appendix removed.  He would also like to have this done.  I have discussed with him in detail the risks and benefits of the operation to remove the appendix as well as some of the technical aspects and he understands and wishes to proceed  Nathan PrettyPaul Toth III, MD 09/04/2019, 9:48 PM

## 2019-09-04 NOTE — ED Provider Notes (Signed)
[facility] EMERGENCY DEPARTMENT Provider Note   CSN: [id number] Arrival date & time: 09/04/19  1411     History Chief Complaint  Patient presents with  . Abdominal Pain    Nathan Baxter is a 74 y.o. man with history of gallstone pancreatitis s/p laparoscopic cholecystectomy March 2019, HTN, and AAA who presents to the ED with abdominal pain and nausea.  Patient reports he was in his usual state of health until late this past Tuesday evening (6/29) when he developed "crampy" abdominal pain, "gas buildup," and diaphoresis. He had eaten fish sticks and frozen vegetables for dinner. At the time, he was unable to pass gas or have a bowel movement and had to "force burping," which provided minimal relief of his pain and gas. He states he was nauseated at the time but unable to vomit. Wednesday (6/30) morning, he reports he was finally able to pass two small, hard bowel movements but his abdominal pain persisted. Beginning yesterday (7/1) and continuing through early this morning, he reports he had several episodes of sharp lower abdominal pains accompanied by diarrhea. His abdominal pain subsequently improved from a 10/10 to a 4/10 and persists to now. States his nausea has improved but reports he has not taken much by mouth because he was worried he would exacerbate his diarrhea. He took two Immodium for the diarrhea and Tylenol for the pain with some mild relief. Describes the abdominal pain has sharp, relapsing and remitting, and located primarily along his lower abdomen with a foci in his RLQ. He has never experienced abdominal pain like this before. The pain worsens with movement and coughing and is alleviated somewhat when he lies still. Reports having diarrhea intermittently since having his gallbladder removed. Denies dysuria, flank pain, passage of kidney stones, fever, chills, chest pain, or SOB. Reports his sister had acute appendicitis a couple of years ago.     Past Medical  History:  Diagnosis Date  . Acute biliary pancreatitis   . Aneurysm of infrarenal abdominal aorta (HCC)   . Cholecystitis   . HLD (hyperlipidemia)   . HTN (hypertension)   . Prediabetes     Patient Active Problem List   Diagnosis Date Noted  . Encounter for removal of biliary stent   . Protein-calorie malnutrition, severe (HCC) 05/26/2017  . Bile leak 05/26/2017  . Acute pancreatitis 05/22/2017  . Acute kidney injury (HCC) 05/22/2017  . Watery diarrhea 05/22/2017  . HLD (hyperlipidemia) 05/22/2017  . HTN (hypertension) 05/22/2017  . S/P cholecystectomy 05/22/2017  . Abnormal urinalysis 05/22/2017  . Renal hematoma/right 05/22/2017  . Aneurysm of infrarenal abdominal aorta (HCC) 05/22/2017  . Acute gangrenous cholecystitis s/p lap cholecystectomy 05/06/2017 05/08/2017  . Hypokalemia 05/08/2017  . Leukocytosis 05/03/2017  . Hypertensive urgency 05/03/2017  . Abdominal aortic aneurysm (AAA) (HCC) 05/03/2017  . Hyperglycemia 05/03/2017    Past Surgical History:  Procedure Laterality Date  . CHOLECYSTECTOMY N/A 05/06/2017   Procedure: LAPAROSCOPIC CHOLECYSTECTOMY;  Surgeon: Almond Lint, MD;  Location: [facility] ORS;  Service: General;  Laterality: N/A;  . ERCP N/A 05/23/2017   Procedure: ENDOSCOPIC RETROGRADE CHOLANGIOPANCREATOGRAPHY (ERCP);  Surgeon: Sherrilyn Rist, MD;  Location: [facility] ENDOSCOPY;  Service: Gastroenterology;  Laterality: N/A;  . ERCP N/A 07/08/2017   Procedure: ENDOSCOPIC RETROGRADE CHOLANGIOPANCREATOGRAPHY (ERCP);  Surgeon: Sherrilyn Rist, MD;  Location: [facility] ENDOSCOPY;  Service: Gastroenterology;  Laterality: N/A;  . IR CATHETER TUBE CHANGE  05/29/2017  . IR RADIOLOGIST EVAL & MGMT  06/05/2017  Family History  Problem Relation Age of Onset  . Breast cancer Mother   . Hypertension Father   . Hypertension Sister     Social History   Tobacco Use  . Smoking status: Former Games developer  . Smokeless tobacco: Never Used  Vaping Use  . Vaping Use: Never used    Substance Use Topics  . Alcohol use: Yes    Comment: occ  . Drug use: No    Home Medications Prior to Admission medications   Medication Sig Start Date End Date Taking? Authorizing Provider  amLODipine (NORVASC) 10 MG tablet Take 1 tablet (10 mg total) by mouth daily. 05/09/17   Rodolph Bong, MD  metoprolol succinate (TOPROL-XL) 25 MG 24 hr tablet TAKE ONE TABLET (25 MG DOSE) BY MOUTH DAILY. 06/18/17   [provider]    Allergies    Sulfa antibiotics  Review of Systems   Review of Systems  Gastrointestinal: Positive for abdominal pain.  All other systems reviewed and are negative.   Physical Exam Updated Vital Signs BP (!) 141/85 (BP Location: Right Arm)   Pulse 78   Temp 98.9 F (37.2 C) (Oral)   Resp 18   Ht 6' (1.829 m)   Wt 99.3 kg   SpO2 99%   BMI 29.70 kg/m   Physical Exam Constitutional:      Comments: Appears uncomfortable, resting in bed  HENT:     Head: Normocephalic and atraumatic.  Eyes:     Extraocular Movements: Extraocular movements intact.  Cardiovascular:     Rate and Rhythm: Normal rate and regular rhythm.     Heart sounds: Normal heart sounds.  Pulmonary:     Effort: Pulmonary effort is normal.     Breath sounds: Normal breath sounds.  Abdominal:     General: A surgical scar is present. Bowel sounds are normal.     Palpations: Abdomen is soft. There is no hepatomegaly, splenomegaly or mass.     Tenderness: There is abdominal tenderness in the right lower quadrant, suprapubic area and left lower quadrant. There is guarding. There is no right CVA tenderness, left CVA tenderness or rebound. Positive signs include Rovsing's sign.  Neurological:     Mental Status: He is alert and oriented to person, place, and time.     ED Results / Procedures / Treatments   Labs (all labs ordered are listed, but only abnormal results are displayed) Labs Reviewed  COMPREHENSIVE METABOLIC PANEL - Abnormal; Notable for the following components:       Result Value   Sodium 133 (*)    CO2 19 (*)    Glucose, Bld 141 (*)    Total Bilirubin 1.5 (*)    All other components within normal limits  CBC - Abnormal; Notable for the following components:   WBC 11.1 (*)    All other components within normal limits  URINALYSIS, ROUTINE W REFLEX MICROSCOPIC - Abnormal; Notable for the following components:   Color, Urine ORANGE (*)    APPearance CLOUDY (*)    Specific Gravity, Urine >1.030 (*)    Hgb urine dipstick TRACE (*)    Bilirubin Urine MODERATE (*)    Ketones, ur 40 (*)    Protein, ur 100 (*)    All other components within normal limits  URINALYSIS, MICROSCOPIC (REFLEX) - Abnormal; Notable for the following components:   Bacteria, UA MANY (*)    All other components within normal limits  URINE CULTURE  SARS CORONAVIRUS 2 BY RT PCR (  HOSPITAL ORDER, PERFORMED IN [facility])  LIPASE, BLOOD    EKG None  Radiology CT ABDOMEN PELVIS W CONTRAST  Result Date: 09/04/2019 CLINICAL DATA:  Right-sided abdominal pain with bloating and diarrhea. EXAM: CT ABDOMEN AND PELVIS WITH CONTRAST TECHNIQUE: Multidetector CT imaging of the abdomen and pelvis was performed using the standard protocol following bolus administration of intravenous contrast. CONTRAST:  100mL OMNIPAQUE IOHEXOL 300 MG/ML  SOLN COMPARISON:  CT abdomen pelvis dated June 05, 2017. FINDINGS: Lower chest: No acute abnormality. Hepatobiliary: No focal liver abnormality is seen. Status post cholecystectomy. Unchanged 3.5 cm thin walled fluid collection in the gallbladder fossa which may be a seroma or dilated cystic duct remnant. No biliary dilatation. Pancreas: Unremarkable. No pancreatic ductal dilatation or surrounding inflammatory changes. Spleen: Normal in size without focal abnormality. Adrenals/Urinary Tract: Adrenal glands are unremarkable. Kidneys are normal, without renal calculi, focal lesion, or hydronephrosis. Bladder is unremarkable. Stomach/Bowel: Dilated  appendix with surrounding inflammatory changes, consistent acute appendicitis. Appendix: Location: Retrocecal Diameter: 9 mm Appendicolith: None Mucosal hyper-enhancement: None Extraluminal gas: None Periappendiceal collection: Small amount of periappendiceal fluid at the base of the appendix without discrete fluid collection. Stomach is within normal limits. There are several mildly dilated loops of jejunum with gradual transition to nondilated small bowel. Vascular/Lymphatic: Increasing infrarenal abdominal aortic aneurysm measuring up to 3.5 cm, previously 3.1 cm. Aortic atherosclerosis. No enlarged abdominal or pelvic lymph nodes. Reproductive: Prostate is unremarkable. Other: No free fluid or pneumoperitoneum. Musculoskeletal: No acute or significant osseous findings. IMPRESSION: 1. Acute appendicitis. No perforation or abscess. 2. Several mildly dilated loops of jejunum with gradual transition to nondilated small bowel, suggestive of ileus. 3. Increasing infrarenal abdominal aortic aneurysm measuring up to 3.5 cm, previously 3.1 cm. Recommend followup by ultrasound in 2 years. This recommendation follows ACR consensus guidelines: White Paper of the ACR Incidental Findings Committee II on Vascular Findings. J Am Coll Radiol 2013; 10:789-794. Aortic aneurysm NOS (ICD10-I71.9) 4. Aortic Atherosclerosis (ICD10-I70.0). Electronically Signed   By: Obie DredgeWilliam T Derry M.D.   On: 09/04/2019 17:15    Procedures Procedures (including critical care time)  Medications Ordered in ED Medications  sodium chloride flush (NS) 0.9 % injection 3 mL (has no administration in time range)  piperacillin-tazobactam (ZOSYN) IVPB 3.375 g (3.375 g Intravenous New Bag/Given 09/04/19 1745)  0.9 %  sodium chloride infusion (500 mLs Intravenous New Bag/Given 09/04/19 1745)  sodium chloride 0.9 % bolus 1,000 mL (0 mLs Intravenous Stopped 09/04/19 1733)  iohexol (OMNIPAQUE) 300 MG/ML solution 100 mL (100 mLs Intravenous Contrast Given  09/04/19 1626)    ED Course  I have reviewed the triage vital signs and the nursing notes.  Pertinent labs & imaging results that were available during my care of the patient were reviewed by me and considered in my medical decision making (see chart for details).  Final Clinical Impression(s) / ED Diagnoses Final diagnoses:  Acute appendicitis with localized peritonitis, without perforation, abscess, or gangrene   Dorita SciaraRobert Riding is a 74 y.o. man with history of gallstone pancreatitis s/p laparoscopic cholecystectomy March 2019, HTN, and AAA who presented to the ED with a 2-day history of abdominal pain and nausea. RLQ abdominal pain with positive Rovsing sign. CT abdomen pelvis with contrast demonstrated acute appendicitis without perforation or abscess. Patient treated with IVF and Zosyn. [facility] by [facility] surgery.  Of note, patient's urinalysis demonstrated many bacteria. CT also demonstrated  increasing infrarenal abdominal aortic aneurysm measuring up to 3.5 cm, previously 3.1 cm. Recommend followup by ultrasound in 2 years.   Rx / DC Orders ED Discharge Orders    None       Alphonzo Severance, MD 09/28/19 2409    Jacalyn Lefevre, MD 09/30/19 [phone]

## 2019-09-04 NOTE — ED Triage Notes (Signed)
Pt c/o right side, lower abd pain-started 6/29-issues with constipation 6/30-had increase in gas, abd pain and diarrhea started last night-NAD-steady gait

## 2019-09-04 NOTE — ED Notes (Signed)
ED Provider at bedside. 

## 2019-09-04 NOTE — Anesthesia Procedure Notes (Signed)
Procedure Name: Intubation Date/Time: 09/04/2019 10:27 PM Performed by: Elyn Peers, CRNA Pre-anesthesia Checklist: Patient identified, Emergency Drugs available, Suction available, Patient being monitored and Timeout performed Patient Re-evaluated:Patient Re-evaluated prior to induction Oxygen Delivery Method: Circle system utilized Preoxygenation: Pre-oxygenation with 100% oxygen Induction Type: IV induction, Cricoid Pressure applied and Rapid sequence Laryngoscope Size: Miller and 3 Grade View: Grade I Tube type: Oral Tube size: 7.5 mm Number of attempts: 1 Airway Equipment and Method: Stylet Placement Confirmation: ETT inserted through vocal cords under direct vision,  positive ETCO2 and breath sounds checked- equal and bilateral Secured at: 23 cm Tube secured with: Tape Dental Injury: Teeth and Oropharynx as per pre-operative assessment  Comments: No pressure or contact on lower teeth with intubation or with OG tube placement.

## 2019-09-04 NOTE — ED Provider Notes (Signed)
Update note  [facility]  74 year old male presented to [facility] with abdominal pain.  Found to have acute appendicitis.  Started on Zosyn, Dr. Carolynne Edouard with general surgery recommended transfer to [facility].  Patient has arrived, he is well-appearing with stable vital signs, has some localized tenderness but no rebound or guarding.  Declines any pain or nausea medicine.  Discussed case with Dr. Carolynne Edouard who will come evaluate patient.   Milagros Loll, MD 09/04/19 2022

## 2019-09-04 NOTE — ED Notes (Signed)
Written consent form obtained.

## 2019-09-05 ENCOUNTER — Other Ambulatory Visit: Payer: Self-pay

## 2019-09-05 ENCOUNTER — Encounter (HOSPITAL_COMMUNITY): Payer: Self-pay | Admitting: General Surgery

## 2019-09-05 DIAGNOSIS — K37 Unspecified appendicitis: Secondary | ICD-10-CM | POA: Diagnosis present

## 2019-09-05 LAB — URINE CULTURE: Culture: NO GROWTH

## 2019-09-05 MED ORDER — METRONIDAZOLE IN NACL 5-0.79 MG/ML-% IV SOLN
500.0000 mg | Freq: Three times a day (TID) | INTRAVENOUS | Status: DC
  Administered 2019-09-05 – 2019-09-06 (×4): 500 mg via INTRAVENOUS
  Filled 2019-09-05 (×4): qty 100

## 2019-09-05 MED ORDER — ONDANSETRON 4 MG PO TBDP: 4.0000 mg | ORAL_TABLET | Freq: Four times a day (QID) | ORAL | Status: DC | PRN

## 2019-09-05 MED ORDER — HYDROCODONE-ACETAMINOPHEN 5-325 MG PO TABS
1.0000 | ORAL_TABLET | ORAL | Status: DC | PRN
  Administered 2019-09-05: 2 via ORAL
  Filled 2019-09-05: qty 2

## 2019-09-05 MED ORDER — HEPARIN SODIUM (PORCINE) 5000 UNIT/ML IJ SOLN
5000.0000 [IU] | Freq: Three times a day (TID) | INTRAMUSCULAR | Status: DC
  Administered 2019-09-06: 5000 [IU] via SUBCUTANEOUS
  Filled 2019-09-05: qty 1

## 2019-09-05 MED ORDER — LISINOPRIL 20 MG PO TABS
20.0000 mg | ORAL_TABLET | Freq: Every day | ORAL | Status: DC
  Administered 2019-09-05 – 2019-09-06 (×2): 20 mg via ORAL
  Filled 2019-09-05 (×2): qty 1

## 2019-09-05 MED ORDER — SUGAMMADEX SODIUM 500 MG/5ML IV SOLN
INTRAVENOUS | Status: DC | PRN
Start: 2019-09-05 — End: 2019-09-05
  Administered 2019-09-05: 300 mg via INTRAVENOUS

## 2019-09-05 MED ORDER — PANTOPRAZOLE SODIUM 40 MG IV SOLR
40.0000 mg | Freq: Every day | INTRAVENOUS | Status: DC
  Administered 2019-09-05: 40 mg via INTRAVENOUS
  Filled 2019-09-05: qty 40

## 2019-09-05 MED ORDER — POLYVINYL ALCOHOL 1.4 % OP SOLN
2.0000 [drp] | OPHTHALMIC | Status: DC | PRN
  Filled 2019-09-05 (×2): qty 15

## 2019-09-05 MED ORDER — ONDANSETRON HCL 4 MG/2ML IJ SOLN: 4.0000 mg | Freq: Four times a day (QID) | INTRAMUSCULAR | Status: DC | PRN

## 2019-09-05 MED ORDER — KCL IN DEXTROSE-NACL 20-5-0.9 MEQ/L-%-% IV SOLN
INTRAVENOUS | Status: DC
  Filled 2019-09-05 (×3): qty 1000

## 2019-09-05 MED ORDER — SODIUM CHLORIDE 0.9 % IV SOLN
2.0000 g | Freq: Every day | INTRAVENOUS | Status: DC
  Administered 2019-09-05 (×2): 2 g via INTRAVENOUS
  Filled 2019-09-05: qty 20
  Filled 2019-09-05 (×2): qty 2

## 2019-09-05 MED ORDER — FENTANYL CITRATE (PF) 100 MCG/2ML IJ SOLN
25.0000 ug | INTRAMUSCULAR | Status: DC | PRN
  Administered 2019-09-05 (×2): 50 ug via INTRAVENOUS

## 2019-09-05 MED ORDER — BUPIVACAINE-EPINEPHRINE 0.25% -1:200000 IJ SOLN
INTRAMUSCULAR | Status: DC | PRN
  Administered 2019-09-04: 20 mL

## 2019-09-05 MED ORDER — METHOCARBAMOL 500 MG PO TABS: 500.0000 mg | ORAL_TABLET | Freq: Four times a day (QID) | ORAL | Status: DC | PRN

## 2019-09-05 MED ORDER — SUGAMMADEX SODIUM 500 MG/5ML IV SOLN
INTRAVENOUS | Status: AC
  Filled 2019-09-05: qty 10

## 2019-09-05 MED ORDER — FENTANYL CITRATE (PF) 100 MCG/2ML IJ SOLN
INTRAMUSCULAR | Status: AC
  Filled 2019-09-05: qty 2

## 2019-09-05 MED ORDER — MORPHINE SULFATE (PF) 2 MG/ML IV SOLN: 1.0000 mg | INTRAVENOUS | Status: DC | PRN

## 2019-09-05 MED ORDER — HYDROCHLOROTHIAZIDE 12.5 MG PO CAPS
12.5000 mg | ORAL_CAPSULE | Freq: Every day | ORAL | Status: DC
  Administered 2019-09-05 – 2019-09-06 (×2): 12.5 mg via ORAL
  Filled 2019-09-05 (×2): qty 1

## 2019-09-05 MED ORDER — ONDANSETRON HCL 4 MG/2ML IJ SOLN
INTRAMUSCULAR | Status: DC | PRN
  Administered 2019-09-05: 4 mg via INTRAVENOUS

## 2019-09-05 MED ORDER — LISINOPRIL-HYDROCHLOROTHIAZIDE 20-12.5 MG PO TABS: 1.0000 | ORAL_TABLET | Freq: Every day | ORAL | Status: DC

## 2019-09-05 NOTE — Transfer of Care (Signed)
Immediate Anesthesia Transfer of Care Note  Patient: Nathan Baxter  Procedure(s) Performed: APPENDECTOMY LAPAROSCOPIC (N/A Abdomen)  Patient Location: PACU  Anesthesia Type:General  Level of Consciousness: awake and patient cooperative  Airway & Oxygen Therapy: Patient Spontanous Breathing and Patient connected to face mask oxygen  Post-op Assessment: Report given to RN and Post -op Vital signs reviewed and stable  Post vital signs: Reviewed and stable  Last Vitals:  Vitals Value Taken Time  BP    Temp 37.1 C 09/05/19 0045  Pulse    Resp    SpO2      Last Pain:  Vitals:   09/04/19 1737  TempSrc:   PainSc: 3          Complications: No complications documented.

## 2019-09-05 NOTE — Op Note (Signed)
09/04/2019 - 09/05/2019  12:29 AM  PATIENT:  Nathan Baxter  74 y.o. male  PRE-OPERATIVE DIAGNOSIS:  ACUTE APPENDICITIS  POST-OPERATIVE DIAGNOSIS:  ACUTE APPENDICITIS  PROCEDURE:  Procedure(s): APPENDECTOMY LAPAROSCOPIC (N/A)  SURGEON:  Surgeon(s) and Role:    Griselda Miner, MD - Primary  PHYSICIAN ASSISTANT:   ASSISTANTS: none   ANESTHESIA:   local and general  EBL:  150 mL   BLOOD ADMINISTERED:none  DRAINS: (1) Jackson-Pratt drain(s) with closed bulb suction in the right paracolic gutter   LOCAL MEDICATIONS USED:  MARCAINE     SPECIMEN:  Source of Specimen:  appendix  DISPOSITION OF SPECIMEN:  PATHOLOGY  COUNTS:  YES  TOURNIQUET:  * No tourniquets in log *  DICTATION: .Dragon Dictation   After informed consent was obtained patient was brought to the operating room placed in the supine position on the operating room table. After adequate induction of general anesthesia the patient's abdomen was prepped with ChloraPrep, allowed to dry, and draped in usual sterile manner. The area below the umbilicus was infiltrated with quarter percent Marcaine. A small incision was made with a 15 blade knife. This incision was carried down through the subcutaneous tissue bluntly with a hemostat and Army-Navy retractors until the linea alba was identified. The linea alba was incised with a 15 blade knife. Each side was grasped Coker clamps and elevated anteriorly. The preperitoneal space was probed bluntly with a hemostat until the peritoneum was opened and access was gained to the abdominal cavity. A 0 Vicryl purse string stitch was placed in the fascia surrounding the opening. A Hassan cannula was placed through the opening and anchored in place with the previously placed Vicryl purse string stitch. The laparoscope was placed through the [facility] cannula. The abdomen was insufflated with carbon dioxide without difficulty. Next the suprapubic area was infiltrated with quarter percent Marcaine. A  small incision was made with a 15 blade knife. A 5 mm port was placed bluntly through this incision into the abdominal cavity. A site was then chosen in the right upper quadrant for placement of a 5 mm port. The area was infiltrated with quarter percent Marcaine. A small stab incision was made with a 15 blade knife. A 5 mm port was placed bluntly through this incision and the abdominal cavity under direct vision. The laparoscope was then moved to the suprapubic port.  There were some filmy omental adhesions along the upper abdomen but these were not in the way.  Using a Glassman grasper and harmonic scalpel the right lower quadrant was inspected. The appendix was identified and was severely inflamed and densely adherent in the retrocecal area.  The terminal ileum and cecum were mobilized by incising its retroperitoneal attachment along the white line of Toldt so that we could roll them medially. The appendix was elevated anteriorly and the mesoappendix was taken down sharply with the harmonic scalpel as well as with blunt dissection with the sucker tip.  I was able to identify the base of the appendix where it joined the cecum.  This area was then divided with a single firing of a battery-powered black load stapler I was then able to take the rest of the appendix out of the retroperitoneum.  The appendix was placed in a laparoscopic bag and then removed with the [facility] cannula through the infraumbilical port.  The Hassan cannula was then replaced and the abdomen was inspected.  The staple line appeared healthy and intact.  The bleeding from the retroperitoneum had  stopped.  The area was irrigated with copious amounts of saline.  I decided to leave a drain.  This was placed through the abdominal wall at the suprapubic port site.  The drain was placed in the right paracolic gutter and anchored to the skin with a 2-0 silk stitch. The fascial defect was closed with the previously placed Vicryl pursestring stitch as well  as with another interrupted 0 Vicryl figure-of-eight stitch. The rest of the ports were removed under direct vision and were found to be hemostatic. The gas was allowed to escape. The skin incisions were closed with interrupted 4-0 Monocryl subcuticular stitches. Dermabond dressings were applied. The patient tolerated the procedure well. At the end of the case all needle sponge and instrument counts were correct. The patient was then awakened and taken to recovery in stable condition.  PLAN OF CARE: Admit for overnight observation  PATIENT DISPOSITION:  PACU - hemodynamically stable.   Delay start of Pharmacological VTE agent (>24hrs) due to surgical blood loss or risk of bleeding: no

## 2019-09-05 NOTE — Anesthesia Postprocedure Evaluation (Signed)
Anesthesia Post Note  Patient: Nathan Baxter  Procedure(s) Performed: APPENDECTOMY LAPAROSCOPIC (N/A Abdomen)     Patient location during evaluation: PACU Anesthesia Type: General Level of consciousness: awake and alert Pain management: pain level controlled Vital Signs Assessment: post-procedure vital signs reviewed and stable Respiratory status: spontaneous breathing, nonlabored ventilation, respiratory function stable and patient connected to nasal cannula oxygen Cardiovascular status: blood pressure returned to baseline and stable Postop Assessment: no apparent nausea or vomiting Anesthetic complications: no   No complications documented.  Last Vitals:  Vitals:   09/05/19 1750 09/05/19 2149  BP: 125/64 120/73  Pulse: 68 66  Resp: 20 16  Temp: 36.8 C 36.8 C  SpO2: 98% 99%    Last Pain:  Vitals:   09/05/19 2149  TempSrc: Oral  PainSc:                  Nathan Baxter

## 2019-09-05 NOTE — Progress Notes (Signed)
Pt received to room 1324 via bed from PACU.  Report received from [facility] RN/PACU. Pt hand book give to pt. Education initiated w/ pt on use of call bell for needs/safety.

## 2019-09-05 NOTE — Progress Notes (Signed)
I concur with previous RN assessment documentation.  

## 2019-09-05 NOTE — Progress Notes (Signed)
1 Day Post-Op   Subjective/Chief Complaint: Feels pretty good. Only reports some soreness   Objective: Vital signs in last 24 hours: Temp:  [98 F (36.7 C)-99.1 F (37.3 C)] 98 F (36.7 C) (07/03 0457) Pulse Rate:  [59-91] 68 (07/03 0457) Resp:  [14-27] 18 (07/03 0457) BP: (115-150)/(63-99) 139/87 (07/03 0457) SpO2:  [96 %-100 %] 100 % (07/03 0457) Weight:  [99.3 kg] 99.3 kg (07/03 0210) Last BM Date: 09/04/19  Intake/Output from previous day: 07/02 0701 - 07/03 0700 In: 2738.7 [I.V.:1488.5; IV Piggyback:1250.1] Out: 570 [Urine:360; Drains:35; Blood:175] Intake/Output this shift: No intake/output data recorded.  General appearance: alert and cooperative Resp: clear to auscultation bilaterally Cardio: regular rate and rhythm GI: soft, mild tenderness. drain output serosanguinous  Lab Results:  Recent Labs    09/04/19 1433  WBC 11.1*  HGB 14.7  HCT 45.6  PLT 304   BMET Recent Labs    09/04/19 1433  NA 133*  K 3.5  CL 99  CO2 19*  GLUCOSE 141*  BUN 19  CREATININE 1.13  CALCIUM 9.1   PT/INR No results for input(s): LABPROT, INR in the last 72 hours. ABG No results for input(s): PHART, HCO3 in the last 72 hours.  Invalid input(s): PCO2, PO2  Studies/Results: CT ABDOMEN PELVIS W CONTRAST  Result Date: 09/04/2019 CLINICAL DATA:  Right-sided abdominal pain with bloating and diarrhea. EXAM: CT ABDOMEN AND PELVIS WITH CONTRAST TECHNIQUE: Multidetector CT imaging of the abdomen and pelvis was performed using the standard protocol following bolus administration of intravenous contrast. CONTRAST:  OMNIPAQUE IOHEXOL 300 MG/ML  SOLN COMPARISON:  CT abdomen pelvis dated June 05, 2017. FINDINGS: Lower chest: No acute abnormality. Hepatobiliary: No focal liver abnormality is seen. Status post cholecystectomy. Unchanged 3.5 cm thin walled fluid collection in the gallbladder fossa which may be a seroma or dilated cystic duct remnant. No biliary dilatation. Pancreas:  Unremarkable. No pancreatic ductal dilatation or surrounding inflammatory changes. Spleen: Normal in size without focal abnormality. Adrenals/Urinary Tract: Adrenal glands are unremarkable. Kidneys are normal, without renal calculi, focal lesion, or hydronephrosis. Bladder is unremarkable. Stomach/Bowel: Dilated appendix with surrounding inflammatory changes, consistent acute appendicitis. Appendix: Location: Retrocecal Diameter: 9 mm Appendicolith: None Mucosal hyper-enhancement: None Extraluminal gas: None Periappendiceal collection: Small amount of periappendiceal fluid at the base of the appendix without discrete fluid collection. Stomach is within normal limits. There are several mildly dilated loops of jejunum with gradual transition to nondilated small bowel. Vascular/Lymphatic: Increasing infrarenal abdominal aortic aneurysm measuring up to 3.5 cm, previously 3.1 cm. Aortic atherosclerosis. No enlarged abdominal or pelvic lymph nodes. Reproductive: Prostate is unremarkable. Other: No free fluid or pneumoperitoneum. Musculoskeletal: No acute or significant osseous findings. IMPRESSION: 1. Acute appendicitis. No perforation or abscess. 2. Several mildly dilated loops of jejunum with gradual transition to nondilated small bowel, suggestive of ileus. 3. Increasing infrarenal abdominal aortic aneurysm measuring up to 3.5 cm, previously 3.1 cm. Recommend followup by ultrasound in 2 years. This recommendation follows ACR consensus guidelines: White Paper of the ACR Incidental Findings Committee II on Vascular Findings. J Am Coll Radiol 2013; 10:789-794. Aortic aneurysm NOS (ICD10-I71.9) 4. Aortic Atherosclerosis (ICD10-I70.0). Electronically Signed   By: Obie Dredge M.D.   On: 09/04/2019 17:15    Anti-infectives: Anti-infectives (From admission, onward)   Start     Dose/Rate Route Frequency Ordered Stop   09/05/19 0215  cefTRIAXone (ROCEPHIN) 2 g in sodium chloride 0.9 % 100 mL IVPB     Discontinue     "And" Linked  Group Details   2 g 200 mL/hr over 30 Minutes Intravenous Daily at bedtime 09/05/19 0204     09/05/19 0215  metroNIDAZOLE (FLAGYL) IVPB 500 mg     Discontinue    "And" Linked Group Details   500 mg 100 mL/hr over 60 Minutes Intravenous Every 8 hours 09/05/19 0204     09/04/19 1745  piperacillin-tazobactam (ZOSYN) IVPB 3.375 g        3.375 g 100 mL/hr over 30 Minutes Intravenous  Once 09/04/19 1737 09/04/19 1819      Assessment/Plan: s/p Procedure(s): APPENDECTOMY LAPAROSCOPIC (N/A) Advance diet  Continue IV abx Continue drain Ambulate Pod 0  LOS: 0 days    Nathan Baxter 09/05/2019

## 2019-09-05 NOTE — Plan of Care (Signed)
POC initiated 

## 2019-09-06 ENCOUNTER — Encounter (HOSPITAL_COMMUNITY): Payer: Self-pay | Admitting: General Surgery

## 2019-09-06 DIAGNOSIS — K3580 Unspecified acute appendicitis: Secondary | ICD-10-CM | POA: Diagnosis present

## 2019-09-06 MED ORDER — AMOXICILLIN-POT CLAVULANATE 875-125 MG PO TABS
1.0000 | ORAL_TABLET | Freq: Two times a day (BID) | ORAL | 0 refills | Status: AC
Start: 2019-09-06 — End: 2019-09-11

## 2019-09-06 MED ORDER — HYDROCODONE-ACETAMINOPHEN 5-325 MG PO TABS: 1.0000 | ORAL_TABLET | Freq: Four times a day (QID) | ORAL | 0 refills | Status: AC | PRN

## 2019-09-06 NOTE — Progress Notes (Signed)
2 Days Post-Op   Subjective/Chief Complaint: No complaints. Feels good. Wants to go home   Objective: Vital signs in last 24 hours: Temp:  [98.1 F (36.7 C)-98.9 F (37.2 C)] 98.4 F (36.9 C) (07/04 0536) Pulse Rate:  [57-68] 60 (07/04 0536) Resp:  [16-20] 16 (07/04 0536) BP: (109-135)/(64-80) 132/80 (07/04 0536) SpO2:  [98 %-100 %] 100 % (07/04 0536) Last BM Date: 09/04/19  Intake/Output from previous day: 07/03 0701 - 07/04 0700 In: 1754.9 [P.O.:660; I.V.:694.9; IV Piggyback:400] Out: 450 [Urine:425; Drains:25] Intake/Output this shift: No intake/output data recorded.  General appearance: alert and cooperative Resp: clear to auscultation bilaterally Cardio: regular rate and rhythm GI: soft, minimal tenderness. incisions look good. drain output serous  Lab Results:  Recent Labs    09/04/19 1433  WBC 11.1*  HGB 14.7  HCT 45.6  PLT 304   BMET Recent Labs    09/04/19 1433  NA 133*  K 3.5  CL 99  CO2 19*  GLUCOSE 141*  BUN 19  CREATININE 1.13  CALCIUM 9.1   PT/INR No results for input(s): LABPROT, INR in the last 72 hours. ABG No results for input(s): PHART, HCO3 in the last 72 hours.  Invalid input(s): PCO2, PO2  Studies/Results: CT ABDOMEN PELVIS W CONTRAST  Result Date: 09/04/2019 CLINICAL DATA:  Right-sided abdominal pain with bloating and diarrhea. EXAM: CT ABDOMEN AND PELVIS WITH CONTRAST TECHNIQUE: Multidetector CT imaging of the abdomen and pelvis was performed using the standard protocol following bolus administration of intravenous contrast. CONTRAST:  OMNIPAQUE IOHEXOL 300 MG/ML  SOLN COMPARISON:  CT abdomen pelvis dated June 05, 2017. FINDINGS: Lower chest: No acute abnormality. Hepatobiliary: No focal liver abnormality is seen. Status post cholecystectomy. Unchanged 3.5 cm thin walled fluid collection in the gallbladder fossa which may be a seroma or dilated cystic duct remnant. No biliary dilatation. Pancreas: Unremarkable. No pancreatic  ductal dilatation or surrounding inflammatory changes. Spleen: Normal in size without focal abnormality. Adrenals/Urinary Tract: Adrenal glands are unremarkable. Kidneys are normal, without renal calculi, focal lesion, or hydronephrosis. Bladder is unremarkable. Stomach/Bowel: Dilated appendix with surrounding inflammatory changes, consistent acute appendicitis. Appendix: Location: Retrocecal Diameter: 9 mm Appendicolith: None Mucosal hyper-enhancement: None Extraluminal gas: None Periappendiceal collection: Small amount of periappendiceal fluid at the base of the appendix without discrete fluid collection. Stomach is within normal limits. There are several mildly dilated loops of jejunum with gradual transition to nondilated small bowel. Vascular/Lymphatic: Increasing infrarenal abdominal aortic aneurysm measuring up to 3.5 cm, previously 3.1 cm. Aortic atherosclerosis. No enlarged abdominal or pelvic lymph nodes. Reproductive: Prostate is unremarkable. Other: No free fluid or pneumoperitoneum. Musculoskeletal: No acute or significant osseous findings. IMPRESSION: 1. Acute appendicitis. No perforation or abscess. 2. Several mildly dilated loops of jejunum with gradual transition to nondilated small bowel, suggestive of ileus. 3. Increasing infrarenal abdominal aortic aneurysm measuring up to 3.5 cm, previously 3.1 cm. Recommend followup by ultrasound in 2 years. This recommendation follows ACR consensus guidelines: White Paper of the ACR Incidental Findings Committee II on Vascular Findings. J Am Coll Radiol 2013; 10:789-794. Aortic aneurysm NOS (ICD10-I71.9) 4. Aortic Atherosclerosis (ICD10-I70.0). Electronically Signed   By: Obie Dredge M.D.   On: 09/04/2019 17:15    Anti-infectives: Anti-infectives (From admission, onward)   Start     Dose/Rate Route Frequency Ordered Stop   09/05/19 0215  cefTRIAXone (ROCEPHIN) 2 g in sodium chloride 0.9 % 100 mL IVPB     Discontinue    "And" Linked Group Details  2 g 200 mL/hr over 30 Minutes Intravenous Daily at bedtime 09/05/19 0204     09/05/19 0215  metroNIDAZOLE (FLAGYL) IVPB 500 mg     Discontinue    "And" Linked Group Details   500 mg 100 mL/hr over 60 Minutes Intravenous Every 8 hours 09/05/19 0204     09/04/19 1745  piperacillin-tazobactam (ZOSYN) IVPB 3.375 g        3.375 g 100 mL/hr over 30 Minutes Intravenous  Once 09/04/19 1737 09/04/19 1819      Assessment/Plan: s/p Procedure(s): APPENDECTOMY LAPAROSCOPIC (N/A) Advance diet Discharge  LOS: 0 days    Nathan Baxter 09/06/2019

## 2019-09-06 NOTE — Progress Notes (Signed)
Discharge instructions given to patient and all questions were answered.  

## 2019-09-06 NOTE — TOC Initial Note (Signed)
Transition of Care [facility]) - Initial/Assessment Note    Patient Details  Name: Nathan Baxter MRN: [id number] Date of Birth: 09/09/1945  [facility] of [facility] CM/SW Contact:    Armanda Heritage, RN Phone Number: 09/06/2019, 10:23 AM  Clinical Narrative: Cab voucher provided to patient.                    Expected Discharge Plan: Home/Self Care Barriers to Discharge: No Barriers Identified   Patient Goals and CMS Choice Patient states their goals for this hospitalization and ongoing recovery are:: to go home today      Expected Discharge Plan and Services Expected Discharge Plan: Home/Self Care   Discharge Planning Services: CM Consult   Living arrangements for the past 2 months: Single Family Home Expected Discharge Date: 09/06/19               DME Arranged: N/A DME Agency: NA       HH Arranged: NA HH Agency: NA        Prior Living Arrangements/Services Living arrangements for the past 2 months: Single Family Home   Patient language and need for interpreter reviewed:: Yes Do you feel safe going back to the place where you live?: Yes      Need for Family Participation in Patient Care: No (Comment) Care giver support system in place?: No (comment)   Criminal Activity/Legal Involvement Pertinent to Current Situation/Hospitalization: No - Comment as needed  Activities of Daily Living Home Assistive Devices/Equipment: None ADL Screening (condition at time of admission) Patient's cognitive ability adequate to safely complete daily activities?: No Is the patient deaf or have difficulty hearing?: No Does the patient have difficulty seeing, even when wearing glasses/contacts?: No Does the patient have difficulty concentrating, remembering, or making decisions?: No Patient able to express need for assistance with ADLs?: Yes Does the patient have difficulty dressing or bathing?: No Independently performs ADLs?: Yes (appropriate for developmental age) Does the  patient have difficulty walking or climbing stairs?: No Weakness of Legs: None Weakness of Arms/Hands: None  Permission Sought/Granted                  Emotional Assessment Appearance:: Appears stated age     Orientation: : Oriented to Self, Oriented to Place, Oriented to  Time, Oriented to Situation   Psych Involvement: No (comment)  Admission diagnosis:  Acute appendicitis with localized peritonitis, without perforation, abscess, or gangrene [K35.30] Appendicitis [K37] Acute appendicitis [K35.80] Patient Active Problem List   Diagnosis Date Noted  . Acute appendicitis 09/06/2019  . Appendicitis 09/05/2019  . Encounter for removal of biliary stent   . Protein-calorie malnutrition, severe (HCC) 05/26/2017  . Bile leak 05/26/2017  . Acute pancreatitis 05/22/2017  . Acute kidney injury (HCC) 05/22/2017  . Watery diarrhea 05/22/2017  . HLD (hyperlipidemia) 05/22/2017  . HTN (hypertension) 05/22/2017  . S/P cholecystectomy 05/22/2017  . Abnormal urinalysis 05/22/2017  . Renal hematoma/right 05/22/2017  . Aneurysm of infrarenal abdominal aorta (HCC) 05/22/2017  . Acute gangrenous cholecystitis s/p lap cholecystectomy 05/06/2017 05/08/2017  . Hypokalemia 05/08/2017  . Leukocytosis 05/03/2017  . Hypertensive urgency 05/03/2017  . Abdominal aortic aneurysm (AAA) (HCC) 05/03/2017  . Hyperglycemia 05/03/2017   PCP:  Patient, No Pcp Per Pharmacy:  No Pharmacies Listed    Social Determinants of Health (SDOH) Interventions    Readmission Risk Interventions No flowsheet data found.

## 2019-09-08 LAB — SURGICAL PATHOLOGY

## 2019-09-17 NOTE — Discharge Summary (Signed)
Patient ID: Nathan Baxter [id number] March 19, 1945 74 y.o.  Admit date: 09/04/2019 Discharge date: 09/06/2019  Admitting Diagnosis: Acute appendicitis  Discharge Diagnosis Patient Active Problem List   Diagnosis Date Noted   Acute appendicitis 09/06/2019   Appendicitis 09/05/2019   Encounter for removal of biliary stent    Protein-calorie malnutrition, severe (HCC) 05/26/2017   Bile leak 05/26/2017   Acute pancreatitis 05/22/2017   Acute kidney injury (HCC) 05/22/2017   Watery diarrhea 05/22/2017   HLD (hyperlipidemia) 05/22/2017   HTN (hypertension) 05/22/2017   S/P cholecystectomy 05/22/2017   Abnormal urinalysis 05/22/2017   Renal hematoma/right 05/22/2017   Aneurysm of infrarenal abdominal aorta (HCC) 05/22/2017   Acute gangrenous cholecystitis s/p lap cholecystectomy 05/06/2017 05/08/2017   Hypokalemia 05/08/2017   Leukocytosis 05/03/2017   Hypertensive urgency 05/03/2017   Abdominal aortic aneurysm (AAA) (HCC) 05/03/2017   Hyperglycemia 05/03/2017  s/p lap appy  Consultants none  Reason for Admission: The patient is a 74 year old white male who began having abdominal pain on Tuesday.  The pain has gradually worsened since then.  The pain is been associated with some nausea but no vomiting.  He has had several episodes of diarrhea.  He has had diaphoresis but no recorded fever.  He went to the emergency department where a CT scan was suggestive of acute appendicitis but no evidence of rupture or abscess.  He does have a history of laparoscopic cholecystectomy complicated by bile leak requiring drains 2 years ago  Procedures Lap appy with drain placement  Hospital Course:  The patient was admitted and underwent a laparoscopic appendectomy. He had a drain left in place secondary to some bleeding. The patient tolerated the procedure well.  On POD 2, the patient was tolerating a regular diet, voiding well, mobilizing, and pain was controlled with oral pain  medications.  His drain was likely removed prior to discharge as best as I can tell from the chart. The patient was stable for DC home at this time with appropriate follow up made.  * I did not participate in this patient's care.  All information was obtained from the chart.*  Physical Exam: See exam in progress note from day of discharge  Allergies as of 09/06/2019      Reactions   Sulfa Antibiotics Rash      Medication List    TAKE these medications   amLODipine 10 MG tablet Commonly known as: NORVASC Take 1 tablet (10 mg total) by mouth daily.   HYDROcodone-acetaminophen 5-325 MG tablet Commonly known as: NORCO/VICODIN Take 1-2 tablets by mouth every 6 (six) hours as needed for moderate pain.   lisinopril-hydrochlorothiazide 20-12.5 MG tablet Commonly known as: ZESTORETIC Take 1 tablet by mouth daily.   metoprolol succinate 25 MG 24 hr tablet Commonly known as: TOPROL-XL TAKE ONE TABLET (25 MG DOSE) BY MOUTH DAILY.   multivitamin tablet Take 1 tablet by mouth daily.   Tylenol 325 MG tablet Generic drug: acetaminophen Take 650 mg by mouth every 6 (six) hours as needed for moderate pain.   vitamin C 1000 MG tablet Take by mouth.     ASK your doctor about these medications   amoxicillin-clavulanate 875-125 MG tablet Commonly known as: Augmentin Take 1 tablet by mouth 2 (two) times daily for 5 days. Ask about: Should I take this medication?         Follow-up Information    Chevis Pretty III, MD Follow up in 2 week(s).   Specialty: General Surgery Contact information: [address] [place] [address] [phone]               Signed: Barnetta Chapel, [facility] 09/17/2019, 12:54 PM Please see Amion for pager number during day hours 7:00am-4:30pm, 7-11:30am on Weekends

## 2020-04-07 DIAGNOSIS — R7309 Other abnormal glucose: Secondary | ICD-10-CM | POA: Diagnosis not present

## 2020-04-07 DIAGNOSIS — J358 Other chronic diseases of tonsils and adenoids: Secondary | ICD-10-CM | POA: Diagnosis not present

## 2020-04-07 DIAGNOSIS — Z0000 Encounter for general adult medical examination without abnormal findings: Secondary | ICD-10-CM | POA: Diagnosis not present

## 2020-04-07 DIAGNOSIS — I10 Essential (primary) hypertension: Secondary | ICD-10-CM | POA: Diagnosis not present

## 2020-05-19 DIAGNOSIS — J358 Other chronic diseases of tonsils and adenoids: Secondary | ICD-10-CM | POA: Diagnosis not present

## 2021-04-12 DIAGNOSIS — Z0000 Encounter for general adult medical examination without abnormal findings: Secondary | ICD-10-CM | POA: Diagnosis not present

## 2021-04-12 DIAGNOSIS — I714 Abdominal aortic aneurysm, without rupture, unspecified: Secondary | ICD-10-CM | POA: Diagnosis not present

## 2021-04-12 DIAGNOSIS — I10 Essential (primary) hypertension: Secondary | ICD-10-CM | POA: Diagnosis not present

## 2021-10-11 DIAGNOSIS — Z0100 Encounter for examination of eyes and vision without abnormal findings: Secondary | ICD-10-CM | POA: Diagnosis not present

## 2022-05-11 DIAGNOSIS — I10 Essential (primary) hypertension: Secondary | ICD-10-CM | POA: Diagnosis not present

## 2022-10-17 DIAGNOSIS — Z0100 Encounter for examination of eyes and vision without abnormal findings: Secondary | ICD-10-CM | POA: Diagnosis not present

## 2022-12-13 ENCOUNTER — Ambulatory Visit: Payer: Medicare HMO | Admitting: Sports Medicine

## 2022-12-13 ENCOUNTER — Encounter: Payer: Self-pay | Admitting: Sports Medicine

## 2022-12-13 ENCOUNTER — Other Ambulatory Visit (INDEPENDENT_AMBULATORY_CARE_PROVIDER_SITE_OTHER): Payer: Medicare HMO

## 2022-12-13 DIAGNOSIS — M65332 Trigger finger, left middle finger: Secondary | ICD-10-CM

## 2022-12-13 DIAGNOSIS — M65342 Trigger finger, left ring finger: Secondary | ICD-10-CM | POA: Diagnosis not present

## 2022-12-13 DIAGNOSIS — M19042 Primary osteoarthritis, left hand: Secondary | ICD-10-CM | POA: Diagnosis not present

## 2022-12-13 MED ORDER — BETAMETHASONE SOD PHOS & ACET 6 (3-3) MG/ML IJ SUSP
3.0000 mg | INTRAMUSCULAR | Status: AC | PRN
  Administered 2022-12-13: 3 mg via INTRA_ARTICULAR

## 2022-12-13 MED ORDER — LIDOCAINE HCL 1 % IJ SOLN
0.5000 mL | INTRAMUSCULAR | Status: AC | PRN
  Administered 2022-12-13: .5 mL

## 2022-12-13 NOTE — Progress Notes (Signed)
[name] - 77 y.o. male MRN [id number]  Date of birth: 10/11/1945  Office Visit Note: Visit Date: 12/13/2022 PCP: Patient, No Pcp Per Referred by: Madelyn Brunner, DO  Subjective: Chief Complaint  Patient presents with   Left Hand - Pain   HPI: Nathan Baxter "Nathan Baxter" is a pleasant 77 y.o. male who presents today for trigger fingers of left hand.  Nathan Baxter states he has been having triggering episodes of the middle long finger, as well as to a lesser degree the ring finger on the left hand.  He is right-hand dominant.  His triggering episodes have been happening for few months now, although a few weeks ago it was exacerbated and happening more frequently.  His pain is relatively minimal today but it does wax and wane depending on the triggering and his activity.  He has not had to passively open the triggering as it relates is actively with extension of the fingers.  He does note some thickening over that region.  Denies any history of Dupuytren's contracture.  He is not taking any medication for this consistently.  Pertinent ROS were reviewed with the patient and found to be negative unless otherwise specified above in HPI.   Assessment & Plan: Visit Diagnoses:  1. Trigger finger, left middle finger   2. Trigger finger, left ring finger   3. Primary osteoarthritis, left hand    Plan: Discussed with Nathan Baxter the nature of his triggering episodes of both the long and ring finger of his left hand.  This has been ongoing for a few months and his pain and triggering waxes and wanes.  We discussed all treatment options such as oral medication, injection therapy, splinting, surgical release.  Through shared decision making, elected to proceed with trigger finger injection into the A1 pulley of the long and the ring finger, see procedure note below.  We did apply a Band-Aid splint which she will keep on the fingers for the next 48 to 72 hours and limit repetitive gripping maneuvers.  He may use ice and/or  Tylenol for any postinjection pain.  We will see him back in 1 month for reevaluation.  If he is 100% improved without any triggering, or likely related to it alone.  If he is getting triggering episodes recurring, we can discuss additional trigger finger injection versus surgical release. He is agreeable to this plan, f/u in 51-month.  Follow-up: Return in about 4 weeks (around 01/10/2023) for for trigger fingers (30-min).   Meds & Orders: No orders of the defined types were placed in this encounter.   Orders Placed This Encounter  Procedures   Hand/UE Inj: L long A1   Hand/UE Inj: L ring A1   XR Hand Complete Left     Procedures: Hand/UE Inj: L long A1 for trigger finger on 12/13/2022 3:28 PM Indications: pain and tendon swelling Details: 25 G needle, volar approach Medications: 0.5 mL lidocaine 1 %; 3 mg betamethasone acetate-betamethasone sodium phosphate 6 (3-3) MG/ML Outcome: tolerated well, no immediate complications  Technically successful trigger finger injection, A1 pulley, left long finger Procedure, treatment alternatives, risks and benefits explained, specific risks discussed. Consent was given by the patient. Immediately prior to procedure a time out was called to verify the correct patient, procedure, equipment, support staff and site/side marked as required. Patient was prepped and draped in the usual sterile fashion.    Hand/UE Inj: L ring A1 for trigger finger on 12/13/2022 3:34 PM Indications: pain and tendon swelling Details: 25 G  needle, volar approach Medications: 0.5 mL lidocaine 1 %; 3 mg betamethasone acetate-betamethasone sodium phosphate 6 (3-3) MG/ML Outcome: tolerated well, no immediate complications  Technically successful trigger finger injection, A1 pulley, left ring finger Procedure, treatment alternatives, risks and benefits explained, specific risks discussed. Consent was given by the patient. Immediately prior to procedure a time out was called to  verify the correct patient, procedure, equipment, support staff and site/side marked as required. Patient was prepped and draped in the usual sterile fashion.          Clinical History: No specialty comments available.  He reports that he has quit smoking. He has never used smokeless tobacco. No results for input(s): "HGBA1C", "LABURIC" in the last 8760 hours.  Objective:    Physical Exam  Gen: Well-appearing, in no acute distress; non-toxic CV: Well-perfused. Warm.  Resp: Breathing unlabored on room air; no wheezing. Psych: Fluid speech in conversation; appropriate affect; normal thought process Neuro: Sensation intact throughout. No gross coordination deficits.   Ortho Exam - Left hand: There is some bony bossing of the IP joints indicative of osteoarthritis.  There is some very mild TTP with thickening of the nodule over the A1 pulley of the long finger.  There is reproducible active triggering with closed fist and open fist.  I cannot reproduce active triggering of the left ring finger today.  Neurovascular intact distally.  Imaging: XR Hand Complete Left  Result Date: 12/13/2022 3 views of the left hand including AP, lateral and oblique view were ordered and reviewed by myself.  X-rays demonstrate global arthritic change with at the DIP and PIP joints.  There is rather notable STT arthritic change.  No acute fracture or otherwise bony abnormality noted.   Past Medical/Family/Surgical/Social History: Medications & Allergies reviewed per EMR, new medications updated. Patient Active Problem List   Diagnosis Date Noted   Acute appendicitis 09/06/2019   Appendicitis 09/05/2019   Encounter for removal of biliary stent    Protein-calorie malnutrition, severe (HCC) 05/26/2017   Bile leak 05/26/2017   Acute pancreatitis 05/22/2017   Acute kidney injury (HCC) 05/22/2017   Watery diarrhea 05/22/2017   HLD (hyperlipidemia) 05/22/2017   HTN (hypertension) 05/22/2017   S/P  cholecystectomy 05/22/2017   Abnormal urinalysis 05/22/2017   Renal hematoma/right 05/22/2017   Aneurysm of infrarenal abdominal aorta (HCC) 05/22/2017   Acute gangrenous cholecystitis s/p lap cholecystectomy 05/06/2017 05/08/2017   Hypokalemia 05/08/2017   Leukocytosis 05/03/2017   Hypertensive urgency 05/03/2017   Abdominal aortic aneurysm (AAA) (HCC) 05/03/2017   Hyperglycemia 05/03/2017   Past Medical History:  Diagnosis Date   Acute biliary pancreatitis    Aneurysm of infrarenal abdominal aorta (HCC)    Cholecystitis    HLD (hyperlipidemia)    HTN (hypertension)    Prediabetes    Family History  Problem Relation Age of Onset   Breast cancer Mother    Hypertension Father    Hypertension Sister    Past Surgical History:  Procedure Laterality Date   CHOLECYSTECTOMY N/A 05/06/2017   Procedure: LAPAROSCOPIC CHOLECYSTECTOMY;  Surgeon: Almond Lint, MD;  Location: [facility];  Service: General;  Laterality: N/A;   ERCP N/A 05/23/2017   Procedure: ENDOSCOPIC RETROGRADE CHOLANGIOPANCREATOGRAPHY (ERCP);  Surgeon: Sherrilyn Rist, MD;  Location: [facility];  Service: Gastroenterology;  Laterality: N/A;   ERCP N/A 07/08/2017   Procedure: ENDOSCOPIC RETROGRADE CHOLANGIOPANCREATOGRAPHY (ERCP);  Surgeon: Sherrilyn Rist, MD;  Location: [facility];  Service: Gastroenterology;  Laterality: N/A;   IR CATHETER TUBE CHANGE  05/29/2017   IR RADIOLOGIST EVAL & MGMT  06/05/2017   LAPAROSCOPIC APPENDECTOMY N/A 09/04/2019   Procedure: APPENDECTOMY LAPAROSCOPIC;  Surgeon: Griselda Miner, MD;  Location: [facility];  Service: General;  Laterality: N/A;   Social History   Occupational History   Not on file  Tobacco Use   Smoking status: Former   Smokeless tobacco: Never  Vaping Use   Vaping status: Never Used  Substance and Sexual Activity   Alcohol use: Yes    Comment: occ   Drug use: No   Sexual activity: Not on file

## 2023-01-10 ENCOUNTER — Encounter: Payer: Self-pay | Admitting: Sports Medicine

## 2023-01-10 ENCOUNTER — Ambulatory Visit: Payer: Medicare HMO | Admitting: Sports Medicine

## 2023-01-10 DIAGNOSIS — M65332 Trigger finger, left middle finger: Secondary | ICD-10-CM

## 2023-01-10 DIAGNOSIS — M65342 Trigger finger, left ring finger: Secondary | ICD-10-CM | POA: Diagnosis not present

## 2023-01-10 MED ORDER — LIDOCAINE HCL 1 % IJ SOLN
1.0000 mL | INTRAMUSCULAR | Status: AC | PRN
  Administered 2023-01-10: 1 mL

## 2023-01-10 MED ORDER — BETAMETHASONE SOD PHOS & ACET 6 (3-3) MG/ML IJ SUSP
6.0000 mg | INTRAMUSCULAR | Status: AC | PRN
  Administered 2023-01-10: 6 mg via INTRA_ARTICULAR

## 2023-01-10 NOTE — Progress Notes (Signed)
Patient says that after the injections he began to gradually feel better. He says that this past Sunday was the last time he noticed any sticking in the fingers. He says that he does still have some soreness in the hand and had bruising after the injection, but otherwise feels he is doing better.

## 2023-01-10 NOTE — Progress Notes (Signed)
Nathan Baxter - 77 y.o. male MRN [id number]  Date of birth: 06/26/1945  Office Visit Note: Visit Date: 01/10/2023 PCP: Patient, No Pcp Per Referred by: No ref. provider found  Subjective: Chief Complaint  Patient presents with   Left Hand - Follow-up   HPI: Nathan Baxter is a pleasant 77 y.o. male who presents today for trigger fingers of the long and ring finger on the left hand.  We performed trigger finger injections 1 month ago and 10/10.  The ring finger has completely resolved from any pain or triggering episodes.  It took a few weeks for the middle finger to resolve but he has not had any triggering episodes since last Sunday, but this was very brief.  Currently has no pain.  Pertinent ROS were reviewed with the patient and found to be negative unless otherwise specified above in HPI.   Assessment & Plan: Visit Diagnoses:  1. Trigger finger, left middle finger   2. Trigger finger, left ring finger    Plan: Nathan Baxter had complete resolution of his left ring finger trigger finger.  His left middle finger has completely resolved over the last week or so but he still had some intermittent triggering status post trigger finger injection 1 month ago.  We discussed to give him more permanent lasting relief and build up on her cumulative benefit we did repeat 1 additional trigger finger injection for the left long finger today, patient tolerated well.  He will keep his Band-Aid splint in place and avoid flexion episodes of the long finger for the next 72 hours, then may return to regular activity as tolerated.  He may use ice or over-the-counter anti-inflammatories only as needed for pain control.  We will follow-up in about 6 weeks for both of these.  If for some reason he has additional triggering episodes of the left long finger, next step would likely be surgical release, although hopefully we do not need this.  Follow-up: Return in about 6 weeks (around 02/21/2023) for for trigger fingers  f/u.   Meds & Orders: No orders of the defined types were placed in this encounter.   Orders Placed This Encounter  Procedures   Hand/UE Inj     Procedures: Hand/UE Inj: L long A1 for trigger finger on 01/10/2023 2:35 PM Indications: pain and tendon swelling Details: 25 G needle, volar approach Medications: 1 mL lidocaine 1 %; 6 mg betamethasone acetate-betamethasone sodium phosphate 6 (3-3) MG/ML Outcome: tolerated well, no immediate complications Procedure, treatment alternatives, risks and benefits explained, specific risks discussed. Consent was given by the patient. Immediately prior to procedure a time out was called to verify the correct patient, procedure, equipment, support staff and site/side marked as required. Patient was prepped and draped in the usual sterile fashion.          Clinical History: No specialty comments available.  He reports that he has quit smoking. He has never used smokeless tobacco. No results for input(s): "HGBA1C", "LABURIC" in the last 8760 hours.  Objective:    Physical Exam  Gen: Well-appearing, in no acute distress; non-toxic CV: Well-perfused. Warm.  Resp: Breathing unlabored on room air; no wheezing. Psych: Fluid speech in conversation; appropriate affect; normal thought process Neuro: Sensation intact throughout. No gross coordination deficits.   Ortho Exam - Left hand: Very mild TTP with some thickening of the nodule over the A1 pulley of the long finger, there is no reproducible triggering with opening close fist of either the long or ring finger  today.  Neurovascularly intact distally.  Imaging: No results found.  Past Medical/Family/Surgical/Social History: Medications & Allergies reviewed per EMR, new medications updated. Patient Active Problem List   Diagnosis Date Noted   Acute appendicitis 09/06/2019   Appendicitis 09/05/2019   Encounter for removal of biliary stent    Protein-calorie malnutrition, severe (HCC) 05/26/2017    Bile leak 05/26/2017   Acute pancreatitis 05/22/2017   Acute kidney injury (HCC) 05/22/2017   Watery diarrhea 05/22/2017   HLD (hyperlipidemia) 05/22/2017   HTN (hypertension) 05/22/2017   S/P cholecystectomy 05/22/2017   Abnormal urinalysis 05/22/2017   Renal hematoma/right 05/22/2017   Aneurysm of infrarenal abdominal aorta (HCC) 05/22/2017   Acute gangrenous cholecystitis s/p lap cholecystectomy 05/06/2017 05/08/2017   Hypokalemia 05/08/2017   Leukocytosis 05/03/2017   Hypertensive urgency 05/03/2017   Abdominal aortic aneurysm (AAA) (HCC) 05/03/2017   Hyperglycemia 05/03/2017   Past Medical History:  Diagnosis Date   Acute biliary pancreatitis    Aneurysm of infrarenal abdominal aorta (HCC)    Cholecystitis    HLD (hyperlipidemia)    HTN (hypertension)    Prediabetes    Family History  Problem Relation Age of Onset   Breast cancer Mother    Hypertension Father    Hypertension Sister    Past Surgical History:  Procedure Laterality Date   CHOLECYSTECTOMY N/A 05/06/2017   Procedure: LAPAROSCOPIC CHOLECYSTECTOMY;  Surgeon: Almond Lint, MD;  Location: [facility];  Service: General;  Laterality: N/A;   ERCP N/A 05/23/2017   Procedure: ENDOSCOPIC RETROGRADE CHOLANGIOPANCREATOGRAPHY (ERCP);  Surgeon: Sherrilyn Rist, MD;  Location: [facility] ENDOSCOPY;  Service: Gastroenterology;  Laterality: N/A;   ERCP N/A 07/08/2017   Procedure: ENDOSCOPIC RETROGRADE CHOLANGIOPANCREATOGRAPHY (ERCP);  Surgeon: Sherrilyn Rist, MD;  Location: [facility];  Service: Gastroenterology;  Laterality: N/A;   IR CATHETER TUBE CHANGE  05/29/2017   IR RADIOLOGIST EVAL & MGMT  06/05/2017   LAPAROSCOPIC APPENDECTOMY N/A 09/04/2019   Procedure: APPENDECTOMY LAPAROSCOPIC;  Surgeon: Griselda Miner, MD;  Location: [facility];  Service: General;  Laterality: N/A;   Social History   Occupational History   Not on file  Tobacco Use   Smoking status: Former   Smokeless tobacco: Never  Vaping Use   Vaping status: Never  Used  Substance and Sexual Activity   Alcohol use: Yes    Comment: occ   Drug use: No   Sexual activity: Not on file

## 2023-02-21 ENCOUNTER — Ambulatory Visit: Payer: Medicare HMO | Admitting: Sports Medicine

## 2023-02-21 ENCOUNTER — Encounter: Payer: Self-pay | Admitting: Sports Medicine

## 2023-02-21 DIAGNOSIS — M65342 Trigger finger, left ring finger: Secondary | ICD-10-CM

## 2023-02-21 DIAGNOSIS — M65332 Trigger finger, left middle finger: Secondary | ICD-10-CM

## 2023-02-21 NOTE — Progress Notes (Signed)
Office Visit Note   Patient: Nathan Baxter           Date of Birth: 12/25/1945           MRN: [id number] Visit Date: 02/21/2023              Requested by: No referring provider defined for this encounter. PCP: Patient, No Pcp Per  Medical Resident - Attending Physician Addendum:   I have independently interviewed and examined the patient myself. I have discussed the above with the original author and agree with their documentation. My edits for correction/addition/clarification have been made, see any changes above and below.   In summary, pleasant 77 year old male with complete resolution of trigger finger injections, middle CSI x 2, ring x 1.  Will let him fully return to activity without restriction.  Discussed notifying us if for some reason his triggering recurs.  If this is in the short-term, likely next step would be surgical release, if this is long-term down the road could consider additional injection therapy. F/u PRN.  Madelyn Brunner, DO Primary Care Sports Medicine Physician  [facility]   Assessment & Plan: Visit Diagnoses:  1. Trigger finger, left middle finger   2. Trigger finger, left ring finger     Plan: Patient with significant improvement of his trigger finger of both left middle and left ring, at this time no need for further intervention.  Discussed with patient that if he does note some triggering that he may schedule an appointment to determine next steps.  Patient would likely continue to have full benefit of left hand and may not require any further intervention and patient was advised that he may continue to use his left at 100%. Ok for OTC anti-inflammatories as needed.  Patient to follow-up as needed.  Follow-Up Instructions: Return if symptoms worsen or fail to improve.    Procedures: No procedures performed   Clinical Data: No additional findings.   Subjective: Chief Complaint  Patient presents with   Left Hand - Follow-up     Patient is here for follow-up of his trigger finger.  Patient has had 2 injections on the left middle finger 1 on 10/10 and the other on 11/07.  Patient has had 1 injection on his left ring finger on 10/10.  Patient states that both his fingers no longer have any triggering whatsoever for pain.  Patient states that he was able to be seen.  He notes that he has severe pain in that index just like his right hand.  Patient otherwise has no other concerns at this time and states he is doing well.    Review of Systems   Objective: Physical Exam  Left hand: Inspection reveals no gross abnormalities, there is no tenderness to palpation over the A1 pulley of the middle or ring finger.  There are some thickening of the A1 pulley of the middle finger greater than the ring.  Flexion of the digits causes no discomfort or locking/triggering.  Patient is able to passively and actively flex and extend his fingers without difficulty.  Patient can also do this against resistance without any concerns.   PMFS History: Patient Active Problem List   Diagnosis Date Noted   Acute appendicitis 09/06/2019   Appendicitis 09/05/2019   Encounter for removal of biliary stent    Protein-calorie malnutrition, severe (HCC) 05/26/2017   Bile leak 05/26/2017   Acute pancreatitis 05/22/2017   Acute kidney injury (HCC) 05/22/2017   Watery diarrhea  05/22/2017   HLD (hyperlipidemia) 05/22/2017   HTN (hypertension) 05/22/2017   S/P cholecystectomy 05/22/2017   Abnormal urinalysis 05/22/2017   Renal hematoma/right 05/22/2017   Aneurysm of infrarenal abdominal aorta (HCC) 05/22/2017   Acute gangrenous cholecystitis s/p lap cholecystectomy 05/06/2017 05/08/2017   Hypokalemia 05/08/2017   Leukocytosis 05/03/2017   Hypertensive urgency 05/03/2017   Abdominal aortic aneurysm (AAA) (HCC) 05/03/2017   Hyperglycemia 05/03/2017   Past Medical History:  Diagnosis Date   Acute biliary pancreatitis    Aneurysm of infrarenal  abdominal aorta (HCC)    Cholecystitis    HLD (hyperlipidemia)    HTN (hypertension)    Prediabetes     Family History  Problem Relation Age of Onset   Breast cancer Mother    Hypertension Father    Hypertension Sister     Past Surgical History:  Procedure Laterality Date   CHOLECYSTECTOMY N/A 05/06/2017   Procedure: LAPAROSCOPIC CHOLECYSTECTOMY;  Surgeon: Almond Lint, MD;  Location: [facility];  Service: General;  Laterality: N/A;   ERCP N/A 05/23/2017   Procedure: ENDOSCOPIC RETROGRADE CHOLANGIOPANCREATOGRAPHY (ERCP);  Surgeon: Sherrilyn Rist, MD;  Location: [facility] END[facility]COPY;  Service: Gastroenterology;  Laterality: N/A;   ERCP N/A 07/08/2017   Procedure: ENDOSCOPIC RETROGRADE CHOLANGIOPANCREATOGRAPHY (ERCP);  Surgeon: Sherrilyn Rist, MD;  Location: [facility];  Service: Gastroenterology;  Laterality: N/A;   IR CATHETER TUBE CHANGE  05/29/2017   IR RADIOLOGIST EVAL & MGMT  06/05/2017   LAPAROSCOPIC APPENDECTOMY N/A 09/04/2019   Procedure: APPENDECTOMY LAPAROSCOPIC;  Surgeon: Griselda Miner, MD;  Location: [facility];  Service: General;  Laterality: N/A;   Social History   Occupational History   Not on file  Tobacco Use   Smoking status: Former   Smokeless tobacco: Never  Vaping Use   Vaping status: Never Used  Substance and Sexual Activity   Alcohol use: Yes    Comment: occ   Drug use: No   Sexual activity: Not on file

## 2023-02-21 NOTE — Progress Notes (Signed)
Patient says that he seems to be better. He says that the left hand feels normal, but does feel different from the right. He believes that is more so because he has avoided bending it for so long.

## 2023-08-07 DIAGNOSIS — Z0000 Encounter for general adult medical examination without abnormal findings: Secondary | ICD-10-CM | POA: Diagnosis not present

## 2023-08-07 DIAGNOSIS — I10 Essential (primary) hypertension: Secondary | ICD-10-CM | POA: Diagnosis not present

## 2023-08-07 DIAGNOSIS — M65332 Trigger finger, left middle finger: Secondary | ICD-10-CM | POA: Diagnosis not present

## 2023-08-07 DIAGNOSIS — J320 Chronic maxillary sinusitis: Secondary | ICD-10-CM | POA: Diagnosis not present

## 2023-08-16 ENCOUNTER — Ambulatory Visit: Admitting: Sports Medicine

## 2023-08-16 ENCOUNTER — Encounter: Payer: Self-pay | Admitting: Sports Medicine

## 2023-08-16 DIAGNOSIS — M65332 Trigger finger, left middle finger: Secondary | ICD-10-CM

## 2023-08-16 DIAGNOSIS — M19042 Primary osteoarthritis, left hand: Secondary | ICD-10-CM

## 2023-08-16 NOTE — Patient Instructions (Addendum)
 Has appt scheduled with Dr. Merlinda Starling Monday, 08/19/23 at 9:30am to discuss trigger finger release surgery (desires in-office)

## 2023-08-16 NOTE — Progress Notes (Signed)
 Patient says that his left middle finger has become painful and sore, and is occasionally triggering again. He says that he did not have pain previously, but now he has constant soreness. He has used Voltaren gel only as needed, but has otherwise not done or taken anything to treat it. He does have a consultation with a recommended physician who does in-office releases, but this appointment is not until August. He is inquiring about injection today to give him relief until that appointment.

## 2023-08-16 NOTE — Progress Notes (Signed)
 Nathan Baxter - 78 y.o. male MRN [id number]  Date of birth: 1945-05-03  Office Visit Note: Visit Date: 08/16/2023 PCP: Patient, No Pcp Per Referred by: No ref. provider found  Subjective: Chief Complaint  Patient presents with   Left Hand - Follow-up   HPI: Nathan Baxter is a pleasant 78 y.o. male who presents today for left long finger trigger finger - acute on chronic.  We have performed a total of 2 injections into the A1 pulley for the left long finger back in October and November.  His second injection was on 01/10/2023 and he had complete resolution of his triggering episodes all the way up until about the last month.  He has pain near the A1 pulley and is getting occasional triggering again.  He is interested in surgical release.  He would like to do this in the office instead of an outpatient surgical center however.  He does have some pain and stiffness in the hands but his major concern is the trigger finger.  He does have an appointment set for evaluation with Dr. Candy Champ in August, but is interested in doing this sooner if possible.  Pertinent ROS were reviewed with the patient and found to be negative unless otherwise specified above in HPI.   Assessment & Plan: Visit Diagnoses:  1. Trigger finger, left middle finger   2. Primary osteoarthritis, left hand    Plan: Impression is recurrent left long finger trigger finger.  We did provide 2 subsequent injections in October and November for the left long finger which gave him complete resolution of his pain until the last month.  He is interested in proceeding with surgical release for permanent fixation.  I will set him up with Dr. Merlinda Starling for evaluation, [facility] [facility] would like to do this in the office as opposed to an outpatient surgical center.  Will discuss this with Dr. Merlinda Starling and he will set him up as long as he is appropriate candidate, which I believe he is.  Given the plan for surgical intervention, we will hold on further injection  therapy.  He does have some notable STT arthritis of the hand as well but his main concern is the trigger finger.  Follow-up: Return for Appt set with Dr. Merlinda Starling for 6/16.   Meds & Orders: No orders of the defined types were placed in this encounter.  No orders of the defined types were placed in this encounter.    Procedures: No procedures performed      Clinical History: No specialty comments available.  He reports that he has quit smoking. He has never used smokeless tobacco. No results for input(s): HGBA1C, LABURIC in the last 8760 hours.  Objective:    Physical Exam  Gen: Well-appearing, in no acute distress; non-toxic CV: Well-perfused. Warm.  Resp: Breathing unlabored on room air; no wheezing. Psych: Fluid speech in conversation; appropriate affect; normal thought process  Ortho Exam - Left hand/long finger: + TTP over the A1 pulley of the left long finger with some hypertrophy over the nodule.  There is some mild active triggering, no needed release with passive motion.  Imaging: No results found.  Past Medical/Family/Surgical/Social History: Medications & Allergies reviewed per EMR, new medications updated. Patient Active Problem List   Diagnosis Date Noted   Acute appendicitis 09/06/2019   Appendicitis 09/05/2019   Encounter for removal of biliary stent    Protein-calorie malnutrition, severe (HCC) 05/26/2017   Bile leak 05/26/2017   Acute pancreatitis 05/22/2017   Acute kidney  injury (HCC) 05/22/2017   Watery diarrhea 05/22/2017   HLD (hyperlipidemia) 05/22/2017   HTN (hypertension) 05/22/2017   S/P cholecystectomy 05/22/2017   Abnormal urinalysis 05/22/2017   Renal hematoma/right 05/22/2017   Aneurysm of infrarenal abdominal aorta (HCC) 05/22/2017   Acute gangrenous cholecystitis s/p lap cholecystectomy 05/06/2017 05/08/2017   Hypokalemia 05/08/2017   Leukocytosis 05/03/2017   Hypertensive urgency 05/03/2017   Abdominal aortic aneurysm (AAA) (HCC)  05/03/2017   Hyperglycemia 05/03/2017   Past Medical History:  Diagnosis Date   Acute biliary pancreatitis    Aneurysm of infrarenal abdominal aorta (HCC)    Cholecystitis    HLD (hyperlipidemia)    HTN (hypertension)    Prediabetes    Family History  Problem Relation Age of Onset   Breast cancer Mother    Hypertension Father    Hypertension Sister    Past Surgical History:  Procedure Laterality Date   CHOLECYSTECTOMY N/A 05/06/2017   Procedure: LAPAROSCOPIC CHOLECYSTECTOMY;  Surgeon: Lockie Rima, MD;  Location: [facility];  Service: General;  Laterality: N/A;   ERCP N/A 05/23/2017   Procedure: ENDOSCOPIC RETROGRADE CHOLANGIOPANCREATOGRAPHY (ERCP);  Surgeon: Albertina Hugger, MD;  Location: [facility] ENDOSCOPY;  Service: Gastroenterology;  Laterality: N/A;   ERCP N/A 07/08/2017   Procedure: ENDOSCOPIC RETROGRADE CHOLANGIOPANCREATOGRAPHY (ERCP);  Surgeon: Albertina Hugger, MD;  Location: [facility];  Service: Gastroenterology;  Laterality: N/A;   IR CATHETER TUBE CHANGE  05/29/2017   IR RADIOLOGIST EVAL & MGMT  06/05/2017   LAPAROSCOPIC APPENDECTOMY N/A 09/04/2019   Procedure: APPENDECTOMY LAPAROSCOPIC;  Surgeon: Caralyn Chandler, MD;  Location: [facility];  Service: General;  Laterality: N/A;   Social History   Occupational History   Not on file  Tobacco Use   Smoking status: Former   Smokeless tobacco: Never  Vaping Use   Vaping status: Never Used  Substance and Sexual Activity   Alcohol  use: Yes    Comment: occ   Drug use: No   Sexual activity: Not on file   I spent 33 minutes in the care of the patient today including face-to-face time, preparation to see the patient, as well as discussion on conservative and operative management of trigger finger, scheduling and discussion with my hand surgeon regarding in office surgical release, discussion with the patient regarding general overview of the possible elective surgery and expected recovery for the above diagnoses.   Shauna Del,  DO Primary Care Sports Medicine Physician  [facility] - Orthopedics  This note was dictated using Dragon naturally speaking software and may contain errors in syntax, spelling, or content which have not been identified prior to signing this note.

## 2023-08-19 ENCOUNTER — Ambulatory Visit: Admitting: Orthopedic Surgery

## 2023-08-19 DIAGNOSIS — M65332 Trigger finger, left middle finger: Secondary | ICD-10-CM | POA: Diagnosis not present

## 2023-08-19 NOTE — Progress Notes (Signed)
 Lyndall Windt - 78 y.o. male MRN [id number]  Date of birth: 01-17-1946  Office Visit Note: Visit Date: 08/19/2023 PCP: Patient, No Pcp Per Referred by: No ref. provider found  Subjective: No chief complaint on file.  HPI: Jamarkus Lisbon is a pleasant 78 y.o. male who presents today for evaluation of a left long finger trigger digit that has been present now for greater than 1 year.  He has been seen previously by Dr. Vaughn Georges who performed prior injection to the left long finger x 2 in October November of last year.  He states that he initially had resolution of his symptoms after the injection, they have since recurred.  He is interested today in potential left long finger trigger digit release.  Pertinent ROS were reviewed with the patient and found to be negative unless otherwise specified above in HPI.   Visit Reason: Left long trigger finger-referral from Dr. Vaughn Georges Duration of symptoms: 1 year Hand dominance: right Occupation: retired Diabetic: previously listed as prediabetic, recent A1c 7.0 Smoking: No Heart/Lung History: none Blood Thinners: none  Prior Testing/EMG: xray in epic Injections (Date): Had 2 by Dr Vaughn Georges Treatments:none Prior Surgery:none  Assessment & Plan: Visit Diagnoses:  1. Trigger finger, left middle finger     Plan: Extensive discussion was had with the patient today regarding his left long finger finger trigger digit.  We discussed the etiology and pathophysiology of stenosing tenosynovitis.  We discussed conservative versus surgical treatment modalities.  From a conservative standpoint, we discussed activity modification, splinting, therapy and injections.  From a surgical standpoint, we discussed the possibility for trigger digit release as well as all risk and benefits associated.  Given that patient has trialed conservative treatments such as prior injections with symptoms refractory to conservative care, patient is indicated for left long finger  trigger digit release.    Risks and benefits of the procedure were discussed, risks including but not limited to infection, bleeding, scarring, stiffness, nerve injury, tendon injury, vascular injury, recurrence of symptoms and need for subsequent operation.  We also discussed the appropriate postoperative protocol and timeframe for return to activities and function.  Forms of anesthesia were also discussed.  Patient expressed understanding.  Understanding the above, he would like to proceed with left long finger trigger digit release under local anesthesia, the surgery can be performed in the office setting per patient preference.   Follow-up: No follow-ups on file.   Meds & Orders: No orders of the defined types were placed in this encounter.  No orders of the defined types were placed in this encounter.    Procedures: No procedures performed      Clinical History: No specialty comments available.  He reports that he has quit smoking. He has never used smokeless tobacco. No results for input(s): HGBA1C, LABURIC in the last 8760 hours.  Objective:   Vital Signs: There were no vitals taken for this visit.  Physical Exam  Gen: Well-appearing, in no acute distress; non-toxic CV: Regular Rate. Well-perfused. Warm.  Resp: Breathing unlabored on room air; no wheezing. Psych: Fluid speech in conversation; appropriate affect; normal thought process  Ortho Exam Left hand: - Palpable nodule at the A1 pulley of the long finger, associated tenderness - Notable clicking with deep flexion of the long finger, no  evidence of significant locking with deep flexion - Sensation intact distally, hand remains warm well-perfused   Imaging: No results found. Prior x-rays of the left hand were reviewed  Past Medical/Family/Surgical/Social History: Medications &  Allergies reviewed per EMR, new medications updated. Patient Active Problem List   Diagnosis Date Noted   Acute appendicitis  09/06/2019   Appendicitis 09/05/2019   Encounter for removal of biliary stent    Protein-calorie malnutrition, severe (HCC) 05/26/2017   Bile leak 05/26/2017   Acute pancreatitis 05/22/2017   Acute kidney injury (HCC) 05/22/2017   Watery diarrhea 05/22/2017   HLD (hyperlipidemia) 05/22/2017   HTN (hypertension) 05/22/2017   S/P cholecystectomy 05/22/2017   Abnormal urinalysis 05/22/2017   Renal hematoma/right 05/22/2017   Aneurysm of infrarenal abdominal aorta (HCC) 05/22/2017   Acute gangrenous cholecystitis s/p lap cholecystectomy 05/06/2017 05/08/2017   Hypokalemia 05/08/2017   Leukocytosis 05/03/2017   Hypertensive urgency 05/03/2017   Abdominal aortic aneurysm (AAA) (HCC) 05/03/2017   Hyperglycemia 05/03/2017   Past Medical History:  Diagnosis Date   Acute biliary pancreatitis    Aneurysm of infrarenal abdominal aorta (HCC)    Cholecystitis    HLD (hyperlipidemia)    HTN (hypertension)    Prediabetes    Family History  Problem Relation Age of Onset   Breast cancer Mother    Hypertension Father    Hypertension Sister    Past Surgical History:  Procedure Laterality Date   CHOLECYSTECTOMY N/A 05/06/2017   Procedure: LAPAROSCOPIC CHOLECYSTECTOMY;  Surgeon: Lockie Rima, MD;  Location: [facility];  Service: General;  Laterality: N/A;   ERCP N/A 05/23/2017   Procedure: ENDOSCOPIC RETROGRADE CHOLANGIOPANCREATOGRAPHY (ERCP);  Surgeon: Albertina Hugger, MD;  Location[facility] ENDOSCOPY;  Service: Gastroenterology;  Laterality: N/A;   ERCP N/A 07/08/2017   Procedure: ENDOSCOPIC RETROGRADE CHOLANGIOPANCREATOGRAPHY (ERCP);  Surgeon: Albertina Hugger, MD;  Location: [facility];  Service: Gastroenterology;  Laterality: N/A;   IR CATHETER TUBE CHANGE  05/29/2017   IR RADIOLOGIST EVAL & MGMT  06/05/2017   LAPAROSCOPIC APPENDECTOMY N/A 09/04/2019   Procedure: APPENDECTOMY LAPAROSCOPIC;  Surgeon: Caralyn Chandler, MD;  Location: [facility];  Service: General;  Laterality: N/A;   Social History    Occupational History   Not on file  Tobacco Use   Smoking status: Former   Smokeless tobacco: Never  Vaping Use   Vaping status: Never Used  Substance and Sexual Activity   Alcohol  use: Yes    Comment: occ   Drug use: No   Sexual activity: Not on file    Ezra Denne Alvia Jointer, M.D. [facility], Hand Surgery

## 2023-08-28 ENCOUNTER — Other Ambulatory Visit: Payer: Self-pay

## 2023-08-28 DIAGNOSIS — M65332 Trigger finger, left middle finger: Secondary | ICD-10-CM

## 2023-09-03 DIAGNOSIS — J320 Chronic maxillary sinusitis: Secondary | ICD-10-CM | POA: Diagnosis not present

## 2023-09-03 DIAGNOSIS — H9190 Unspecified hearing loss, unspecified ear: Secondary | ICD-10-CM | POA: Diagnosis not present

## 2023-09-10 ENCOUNTER — Ambulatory Visit: Admitting: Orthopedic Surgery

## 2023-09-10 DIAGNOSIS — M65332 Trigger finger, left middle finger: Secondary | ICD-10-CM

## 2023-09-10 NOTE — Progress Notes (Signed)
 Procedure Note  Patient: Nathan Baxter             Date of Birth: September 28, 1945           MRN: [id number]             Visit Date: 09/10/2023  Procedures: Visit Diagnoses:  1. Trigger finger, left middle finger     NAME: Keilan Nichol MEDICAL RECORD NO: [id number] DATE OF BIRTH: 1945-05-12 FACILITY: [facility] LOCATION: [facility] PHYSICIAN: GILDARDO ALDERTON, MD   OPERATIVE REPORT   DATE OF PROCEDURE: 09/10/23    PREOPERATIVE DIAGNOSIS: Left long finger trigger digit   POSTOPERATIVE DIAGNOSIS: Left long finger trigger digit   PROCEDURE: Left long finger trigger digit release   SURGEON:  GILDARDO ALDERTON, M.D.   ASSISTANT: Almeda Rummer, PA   ANESTHESIA:  Local   ESTIMATED BLOOD LOSS:  Minimal.   COMPLICATIONS:  None.   SPECIMENS:  none   TOURNIQUET TIME: 7 minutes   DISPOSITION:  Stable to PACU.   INDICATIONS: 78 year old male with history of left long finger trigger digit that was refractory to conservative care.  Patient was indicated for left long finger trigger digit release.  Risks and benefits of surgery were discussed including the risks of infection, bleeding, scarring, stiffness, nerve injury, vascular injury, tendon injury, need for subsequent operation, recurrence.  He voiced understanding of these risks and elected to proceed.  OPERATIVE COURSE: Patient was seen and identified in the preoperative area and marked appropriately.  Surgical consent had been signed. He was transferred to the procedure room and placed in supine position with the left upper extremity on an arm board.  10 cc of 1% lidocaine  plain was utilized around the planned incisional site.  Left upper extremity was prepped and draped in normal sterile orthopedic fashion.  A surgical pause was performed between the surgeon and staff and all were in agreement as to the patient, procedure, and site of procedure.  Tourniquet was placed and padded appropriately to the left upper arm.  A 2 cm  oblique incision was designed at the base of the long finger.  This incision was carried down to the subcutaneous tissues.  The A1 pulley was identified and sharply divided utilizing a Beaver blade.  Tenotomy scissors were then used to confirm complete release of the A1 pulley.  The proximal portion of the A2 pulley was also released, not exceeding 25% of its substance.  Following tendon sheath incision, traction tenolysis of both FDP and FDS tendons was performed utilizing Ragnell retractors.  Smooth gliding to the tendon surface was noted.  The adherent flexor tenosynovial tissues were identified, sharply divided and sharply excised.    Patient was asked to flex and extend the long finger to confirm no residual clicking or locking.  Tourniquet was subsequently deflated.  Copious irrigation was performed.  Wound was closed utilizing 4-0 nylon in horizontal mattress fashion.   The tourniquet was deflated at 7 minutes.  Fingertips were pink with brisk capillary refill after deflation of tourniquet.  The operative drapes were broken down.  The patient was awoken from anesthesia safely and taken to PACU in stable condition.   Post-operative plan: The patient will recover in the post-anesthesia care unit and then be discharged home.  The patient will be non weight bearing on the left upper extremity in a soft dressing with discharge instructions for appropriate dressing removal, wound care and pain control.   I will see the patient back in the office in 2  weeks for postoperative followup.    Concha Sudol, MD Electronically signed, 09/10/23

## 2023-09-19 NOTE — Therapy (Signed)
 OUTPATIENT OCCUPATIONAL THERAPY ORTHO EVALUATION AND DISCHARGE NOTE  Patient Name: Nathan Baxter MRN: [id number] DOB:[birth year], 78 y.o., male Today's Date: 09/24/2023  REFERRING PROVIDER: Arlinda Buster, MD   END OF SESSION:   Past Medical History:  Diagnosis Date   Acute biliary pancreatitis    Aneurysm of infrarenal abdominal aorta (HCC)    Cholecystitis    HLD (hyperlipidemia)    HTN (hypertension)    Prediabetes    Past Surgical History:  Procedure Laterality Date   CHOLECYSTECTOMY N/A 05/06/2017   Procedure: LAPAROSCOPIC CHOLECYSTECTOMY;  Surgeon: Aron Shoulders, MD;  Location: [facility];  Service: General;  Laterality: N/A;   ERCP N/A 05/23/2017   Procedure: ENDOSCOPIC RETROGRADE CHOLANGIOPANCREATOGRAPHY (ERCP);  Surgeon: Legrand Victory LITTIE DOUGLAS, MD;  Location: [facility];  Service: Gastroenterology;  Laterality: N/A;   ERCP N/A 07/08/2017   Procedure: ENDOSCOPIC RETROGRADE CHOLANGIOPANCREATOGRAPHY (ERCP);  Surgeon: Legrand Victory LITTIE DOUGLAS, MD;  Location: [facility];  Service: Gastroenterology;  Laterality: N/A;   IR CATHETER TUBE CHANGE  05/29/2017   IR RADIOLOGIST EVAL & MGMT  06/05/2017   LAPAROSCOPIC APPENDECTOMY N/A 09/04/2019   Procedure: APPENDECTOMY LAPAROSCOPIC;  Surgeon: Curvin Deward DOUGLAS, MD;  Location: [facility];  Service: General;  Laterality: N/A;   Patient Active Problem List   Diagnosis Date Noted   Acute appendicitis 09/06/2019   Appendicitis 09/05/2019   Encounter for removal of biliary stent    Protein-calorie malnutrition, severe (HCC) 05/26/2017   Bile leak 05/26/2017   Acute pancreatitis 05/22/2017   Acute kidney injury (HCC) 05/22/2017   Watery diarrhea 05/22/2017   HLD (hyperlipidemia) 05/22/2017   HTN (hypertension) 05/22/2017   S/P cholecystectomy 05/22/2017   Abnormal urinalysis 05/22/2017   Renal hematoma/right 05/22/2017   Aneurysm of infrarenal abdominal aorta (HCC) 05/22/2017   Acute gangrenous cholecystitis s/p lap cholecystectomy 05/06/2017 05/08/2017    Hypokalemia 05/08/2017   Leukocytosis 05/03/2017   Hypertensive urgency 05/03/2017   Abdominal aortic aneurysm (AAA) (HCC) 05/03/2017   Hyperglycemia 05/03/2017     ONSET DATE:  DOS 09/10/23  REFERRING DIAG: F34.667 (ICD-10-CM) - Trigger finger, left middle finger   THERAPY DIAG:     Pain in left hand  Stiffness of left hand, not elsewhere classified  Muscle weakness (generalized)  Localized edema  Rationale for Evaluation and Treatment: Rehabilitation  SUBJECTIVE:   SUBJECTIVE STATEMENT: The patient states hx of triggering and pain in their hand and subsequent surgical release. The patient states having some stiffness, pain, decreased ability to make a fist and perform I/ADLs.     PERTINENT HISTORY: The patient is now approx 2 weeks s/p Lt hand MF finger TFR.   PRECAUTIONS: None relative to this evaluation and episode of care.   RED FLAGS: None   WEIGHT BEARING RESTRICTIONS: Yes: caution with weightbearing for the next 4-6 weeks, recommended less than 5lbs for next 2 weeks with affected hand  PAIN:  Are you having pain? Yes: NPRS scale: between mild and moderate now at rest  Pain location:  sx area Pain description: aching and sore Aggravating factors: gripping/squeezing Relieving factors: rest  FALLS: Has patient fallen in last 6 months? No, not a fall risk  PLOF: Independent with I/ADLs  PATIENT GOALS: To improve motion, function with affected surgical hand  NEXT MD VISIT: PRN    OBJECTIVE MEASURES:   ADLs: Overall ADLs: States decreased ability to grab, hold household objects, pain and difficulty to open containers, perform FMS tasks (manipulate fasteners on clothing).     UPPER EXTREMITY ROM:  A/ROM Left eval  Wrist flexion 76  Wrist extension 76  (Blank rows = not tested)                    Hand A/ROM Left eval  Full Fist Ability (or Gap to Distal Palmar Crease) Unable due to stiffness/soreness  Thumb Opposition  (Kapandji Scale)   9/10  Thumb MCP (0-60) 0- 34  Thumb IP (0-80) 0 - 54  Index MCP (0-90)    Index PIP (0-100)    Index DIP (0-70)    Long MCP (0-90)  0- 77  Long PIP (0-100)  0 - 90  Long DIP (0-70)  0- 55  Ring MCP (0-90)    Ring PIP (0-100)    Ring DIP (0-70)    Little MCP (0-90)    Little PIP (0-100)    Little DIP (0-70)    (Blank rows = not tested)   HAND STRENGTH & FUNCTION: Eval: Observed weakness in affected hand/arm, grossly 3-/5 MMT, but specific gripping and resistance training contraindicated today. Also at least mild observed coordination impairments with affected hand/arm due to stiffness and soreness. These deficits are expected to improve with HEP and recommendations.    COORDINATION: Eval: Mild observed coordination impairments with surgical hand, as seen by pain,stiffness, etc. Expected to improve with HEP and recommendations.   SENSATION: Eval:  Light touch mildly diminished especially through sx area. Expected to improve with HEP and recommendations.   EDEMA:   Eval:  Mildly swollen in surgical hand today.  Expected to improve with HEP and recommendations.   COGNITION: Eval: Overall cognitive status: WFL for evaluation today   OBSERVATIONS:   Eval: Surgical site is clean and no overt signs of infection, no drainage, signs of dehiscence, etc.  Tenderness and swelling is within normal limits for post-op timeframe.     TODAY'S TREATMENT:  Post-evaluation treatment:   The patient was given safety information for managing post-op wound, including not to soak wound, to keep clean and dry, to start with gentle scar mobilizations as the wound is closed well now.  The patient should contact the surgeon with any concerns immediately.   The patient should also avoid any strong gripping, push, pull, weight bearing or repetitive motion for the next month.  The patient  should not be doing painful activities.   After a month, the patient can progressively return to all light, normal  activities. Sports and heavy weight lifting should be withheld for a total of 3 months.   The patient was also educated (explanation and demonstration) on the following home exercise program including tolerable range of motion, gentle passive range of motion, scar care, progressive desensitization, prevention of soft tissue contractures, etc. The patient states understanding all directions and feels comfortable with doing this at home, self-management, and following up with the surgeon as needed/scheduled.    Exercises - Bend and Pull Back Wrist SLOWLY  - 4-6 x daily - 10-15 reps - Tendon Glides  - 3-4 x daily - 5 reps - 3 second hold - Wrist Prayer Stretch  - 3-4 x daily - 3 reps - 15 seconds hold - Full finger stretches   - 3-4 x daily - 3 reps - 15 second hold - Push your knuckles down, pull back hand to feel a stretch  - 3-4 x daily - 3 reps - 15 second hold Patient Education - Scar Massage   See pt info section for full info given out today.  PATIENT EDUCATION: Education details: See tx section above for details  Person educated: Patient Education method: Verbal Instruction, Teach back, Handouts  Education comprehension: States and demonstrates understanding   HOME EXERCISE PROGRAM: Access Code: [email or web address] URL: [email or web address] Date: 09/24/2023 Prepared by: Melvenia Ada   GOALS: Goals reviewed with patient? Yes   SHORT TERM GOALS: (STG required if POC>30 days) Target Date: 09/24/23  1.  Pt will demo/state understanding of initial HEP and therapist recommendations to improve pain levels, improve motions and ability and eventually return to normal activities.   Goal status: MET    ASSESSMENT:  CLINICAL IMPRESSION: Patient is a 78 y.o. male who was seen today for occupational therapy evaluation for swelling, pain, weakness and decreased functional ability following trigger finger release procedure. The patient is appropriate for OT rehab services  and benefited from treatment today. The patient got copious education/treatment today for self-care, wound management, exercises and how to transition to normal activities in the next 4-6 weeks. The patient agrees that they can manage these recommendations independently, and should not need to return for follow up visits. The patient should follow up with the surgeon with any concerns, and could possibly return to therapy, if needed, with a new order.  The patient will discharge therapy treatment after this visit.     PERFORMANCE DEFICITS: in functional skills including ADLs, IADLs, coordination, dexterity, sensation, edema, ROM, strength, pain, fascial restrictions, flexibility, Fine motor control, body mechanics, endurance, decreased knowledge of precautions, wound, and UE functional use, cognitive skills including problem solving and safety awareness, and psychosocial skills including coping strategies, environmental adaptation, and habits.   IMPAIRMENTS: are limiting patient from ADLs, IADLs, rest and sleep, leisure, and social participation.   COMORBIDITIES: may have co-morbidities  that affects occupational performance. Patient will benefit from skilled OT to address above impairments and improve overall function.  MODIFICATION OR ASSISTANCE TO COMPLETE EVALUATION: No modification of tasks or assist necessary to complete an evaluation.  OT OCCUPATIONAL PROFILE AND HISTORY: Problem focused assessment: Including review of records relating to presenting problem.  CLINICAL DECISION MAKING: LOW - limited treatment options, no task modification necessary  REHAB POTENTIAL: Excellent  EVALUATION COMPLEXITY: Low      PLAN:  OT FREQUENCY: one time visit  OT DURATION: 1 sessions  PLANNED INTERVENTIONS: self care/ADL training, therapeutic exercise, therapeutic activity, neuromuscular re-education, manual therapy, scar mobilization, passive range of motion, splinting, ultrasound, fluidotherapy,  compression bandaging, moist heat, cryotherapy, contrast bath, patient/family education, energy conservation, coping strategies training, and Re-evaluation  RECOMMENDED OTHER SERVICES: none now   CONSULTED AND AGREED WITH PLAN OF CARE: Patient  PLAN FOR NEXT SESSION:   N/A    Melvenia Ada, OTR/L, CHT 09/24/2023, 10:17 AM

## 2023-09-24 ENCOUNTER — Encounter: Payer: Self-pay | Admitting: Rehabilitative and Restorative Service Providers"

## 2023-09-24 ENCOUNTER — Ambulatory Visit: Admitting: Rehabilitative and Restorative Service Providers"

## 2023-09-24 ENCOUNTER — Ambulatory Visit (INDEPENDENT_AMBULATORY_CARE_PROVIDER_SITE_OTHER): Admitting: Orthopedic Surgery

## 2023-09-24 DIAGNOSIS — Z9889 Other specified postprocedural states: Secondary | ICD-10-CM

## 2023-09-24 DIAGNOSIS — M25642 Stiffness of left hand, not elsewhere classified: Secondary | ICD-10-CM

## 2023-09-24 DIAGNOSIS — M79642 Pain in left hand: Secondary | ICD-10-CM | POA: Diagnosis not present

## 2023-09-24 DIAGNOSIS — R600 Localized edema: Secondary | ICD-10-CM

## 2023-09-24 DIAGNOSIS — M6281 Muscle weakness (generalized): Secondary | ICD-10-CM

## 2023-09-24 NOTE — Patient Instructions (Signed)
  Nate's Trigger Finger Release Recommendations   Do not to soak your wound for at least 1 week, or until it looks well healed. Wash hand or shower quickly, then dab dry gently. You can put a Vaseline or plain lotion on your closed wound to help the scar heal smoothly. (Don't leave it looking dry and crusty.)   Avoid any strong gripping, push, pull, weight bearing or repetitive motion for the next month.  (If it hurts very badly- don't do it!) After a month, you can progressively return to all normal, light activities. Sports and heavy weightlifting should be withheld for about 3 months.   Gently rub/touch the surface of your scar 4-6x day to desensitize it or "normalize" your sensation. Once your surgical wound is looking healed and closed (about 3 days after stitches are out) start touching/rubbing your scar trying to shift the top layer of your skin. It's ok to do this gently over top of steri-strips. Do not remove steri-strips, but wait for them to fall off naturally. Rub, touch gently for 2-3 minutes, 3-4 x day.      If you have any tingling or itching/burning around your scar, just lightly rub/brush it with a cloth for 2-3 mins to calm your nerves.  Begin with gentle motion exercises about 4 times a day, non-painfully, feeling light to medium tension.  (See exercise sheet)  Wait 3-4 weeks after surgery to use "squeeze balls" or "hand grippers"  Wait 7-8 weeks after surgery to start strengthening in your arm in a gym setting   In general, keep your hand moving, and don't let it heal stiffly.  If you're sore- slow down!   If you're stiff- speed up!     If you are having any new problems, lingering stiffness, pain, signs of infection (redness, swelling, oozing puss, tenderness), contact your surgeon immediately.  He may send you to formal hand therapy.

## 2023-09-24 NOTE — Progress Notes (Signed)
   Nathan Baxter - 78 y.o. male MRN [id number]  Date of birth: Aug 13, 1945  Office Visit Note: Visit Date: 09/24/2023 PCP: Patient, No Pcp Per Referred by: No ref. provider found  Subjective:  HPI: Nathan Baxter is a 78 y.o. male who presents today for follow up 2 weeks status post left middle finger trigger release.  He is doing very well overall, triggering has resolved entirely.  His range of motion is excellent.  Does have some mild swelling at the incisional site with some hypersensitivity which is improving.  Pertinent ROS were reviewed with the patient and found to be negative unless otherwise specified above in HPI.   Assessment & Plan: Visit Diagnoses: No diagnosis found.  Plan: He is doing very well postoperatively.  Sutures removed today without incident.  He will be seen by occupational therapy today for exercises and transition to home exercise program.  Follow-up with myself in approxi-1 month to track progress.  Follow-up: No follow-ups on file.   Meds & Orders: No orders of the defined types were placed in this encounter.  No orders of the defined types were placed in this encounter.    Procedures: No procedures performed       Objective:   Vital Signs: There were no vitals taken for this visit.  Ortho Exam Left hand: - Well-healing incision at the base of the long finger, skin is well-approximated, no erythema or drainage - Able to perform full digital range of motion without residual clicking or locking - Sensation intact distally, hand is warm well-perfused   Imaging: No results found.   Ellerie Arenz Afton Alderton, M.D. [facility], Hand Surgery

## 2023-10-02 DIAGNOSIS — J342 Deviated nasal septum: Secondary | ICD-10-CM | POA: Diagnosis not present

## 2023-10-02 DIAGNOSIS — J320 Chronic maxillary sinusitis: Secondary | ICD-10-CM | POA: Diagnosis not present

## 2023-10-02 DIAGNOSIS — H903 Sensorineural hearing loss, bilateral: Secondary | ICD-10-CM | POA: Diagnosis not present

## 2023-10-02 DIAGNOSIS — I38 Endocarditis, valve unspecified: Secondary | ICD-10-CM | POA: Diagnosis not present

## 2023-10-23 ENCOUNTER — Ambulatory Visit (INDEPENDENT_AMBULATORY_CARE_PROVIDER_SITE_OTHER): Admitting: Orthopedic Surgery

## 2023-10-23 DIAGNOSIS — Z9889 Other specified postprocedural states: Secondary | ICD-10-CM

## 2023-10-23 NOTE — Progress Notes (Signed)
   Nathan Baxter - 78 y.o. male MRN [id number]  Date of birth: 12-03-1945  Office Visit Note: Visit Date: 10/23/2023 PCP: Patient, No Pcp Per Referred by: No ref. provider found  Subjective:  HPI: Nathan Baxter is a 78 y.o. male who presents today for follow up 6 weeks status post left middle finger trigger release.  He is doing very well overall, triggering has resolved entirely.  Some mild scar tissue near the incisional site with slight hypersensitivity as well.  Has resumed activities as tolerated without significant restrictions.  Was able to play golf last week.  Pertinent ROS were reviewed with the patient and found to be negative unless otherwise specified above in HPI.   Assessment & Plan: Visit Diagnoses: No diagnosis found.  Plan: He is doing very well postoperatively.  Continue to advance activities as tolerated.  No evidence of residual triggering.  Incisional site is well-healed.  Follow-up as needed.  Follow-up: No follow-ups on file.   Meds & Orders: No orders of the defined types were placed in this encounter.  No orders of the defined types were placed in this encounter.    Procedures: No procedures performed       Objective:   Vital Signs: There were no vitals taken for this visit.  Ortho Exam Left hand: - Well-healed incision at the base of the long finger, skin is well-approximated, no erythema or drainage - Able to perform full digital range of motion without residual clicking or locking - Sensation intact distally, hand is warm well-perfused   Imaging: No results found.   Starling Jessie Afton Alderton, M.D. [facility], Hand Surgery

## 2024-01-06 ENCOUNTER — Encounter: Payer: Self-pay | Admitting: Radiology
